# Patient Record
Sex: Male | Born: 1955 | Race: White | Hispanic: No | Marital: Married | Smoking: Never smoker
Health system: Southern US, Community
[De-identification: ages and names within clinical notes are randomized; demographics above are authoritative.]

## PROBLEM LIST (undated history)

## (undated) DIAGNOSIS — M1990 Unspecified osteoarthritis, unspecified site: Secondary | ICD-10-CM

## (undated) DIAGNOSIS — E785 Hyperlipidemia, unspecified: Secondary | ICD-10-CM

## (undated) DIAGNOSIS — R296 Repeated falls: Secondary | ICD-10-CM

## (undated) DIAGNOSIS — K259 Gastric ulcer, unspecified as acute or chronic, without hemorrhage or perforation: Secondary | ICD-10-CM

## (undated) DIAGNOSIS — Z8619 Personal history of other infectious and parasitic diseases: Secondary | ICD-10-CM

## (undated) DIAGNOSIS — J189 Pneumonia, unspecified organism: Secondary | ICD-10-CM

## (undated) DIAGNOSIS — E119 Type 2 diabetes mellitus without complications: Secondary | ICD-10-CM

## (undated) DIAGNOSIS — I10 Essential (primary) hypertension: Secondary | ICD-10-CM

## (undated) DIAGNOSIS — Z87442 Personal history of urinary calculi: Secondary | ICD-10-CM

## (undated) DIAGNOSIS — R202 Paresthesia of skin: Secondary | ICD-10-CM

## (undated) DIAGNOSIS — R2689 Other abnormalities of gait and mobility: Secondary | ICD-10-CM

## (undated) DIAGNOSIS — R200 Anesthesia of skin: Secondary | ICD-10-CM

## (undated) HISTORY — DX: Hyperlipidemia, unspecified: E78.5

## (undated) HISTORY — PX: URETERAL STENT PLACEMENT: SHX822

## (undated) HISTORY — PX: OTHER SURGICAL HISTORY: SHX169

## (undated) HISTORY — DX: Essential (primary) hypertension: I10

## (undated) HISTORY — PX: KNEE ARTHROSCOPY: SHX127

---

## 2005-06-30 ENCOUNTER — Ambulatory Visit (HOSPITAL_COMMUNITY): Admission: RE | Admit: 2005-06-30 | Discharge: 2005-06-30 | Payer: Self-pay | Admitting: Family Medicine

## 2012-01-08 ENCOUNTER — Other Ambulatory Visit: Payer: Self-pay | Admitting: Orthopedic Surgery

## 2012-01-08 DIAGNOSIS — R609 Edema, unspecified: Secondary | ICD-10-CM

## 2012-01-11 ENCOUNTER — Ambulatory Visit
Admission: RE | Admit: 2012-01-11 | Discharge: 2012-01-11 | Disposition: A | Discharge status: Home / Self Care | Payer: 59 | Source: Ambulatory Visit | Attending: Orthopedic Surgery | Admitting: Orthopedic Surgery

## 2012-01-11 DIAGNOSIS — R609 Edema, unspecified: Secondary | ICD-10-CM

## 2014-02-16 ENCOUNTER — Ambulatory Visit (INDEPENDENT_AMBULATORY_CARE_PROVIDER_SITE_OTHER): Payer: 59

## 2014-02-16 VITALS — BP 140/81 | HR 92 | Resp 16 | Ht 71.0 in | Wt 313.0 lb

## 2014-02-16 DIAGNOSIS — M79609 Pain in unspecified limb: Secondary | ICD-10-CM

## 2014-02-16 DIAGNOSIS — M722 Plantar fascial fibromatosis: Secondary | ICD-10-CM

## 2014-02-16 MED ORDER — CELECOXIB 200 MG PO CAPS: 200.0000 mg | ORAL_CAPSULE | Freq: Every day | ORAL | Status: DC

## 2014-02-16 NOTE — Progress Notes (Signed)
   Subjective:    Patient ID: Brent Duarte, male    DOB: 01/27/1956, 58 y.o.   MRN: [id number]  HPI Comments: "I have heel pain"  Patient c/o sharp plantar heel right for a several weeks. He has noticed pain up the lateral side of foot recently. He has AM pain and after sitting for long periods. Walking makes worse. History of achilles tendonitis, went through PT and it finally got better. He has tried new shoes, ice and rest-no reilef.  Foot Pain Associated symptoms include myalgias.   patient also has a history of psoriasis is been treated a dermatology office with a steroid injections periodically. However the arthritis and psoriasis to starting to affect his hands and feet. Patient also has a history of back problems was nursing by the orthopedist or neurosurgeon however still some point will likely need to have surgery I suggest he follow up with a neurosurgeon some point in the future.    Review of Systems  Musculoskeletal: Positive for back pain and myalgias.  All other systems reviewed and are negative.      Objective:   Physical Exam 58 year old white male well-developed well-nourished oriented x3 presents this time with a complaint of the inferior right heel pain and arch pain. Its been going on for a period of months with little or no improvement. Normal humerus work boots steel toe new balance athletic shoes however never had orthotics or arch supports in the past. Recently also for muscle in his right arm trying to pull a lawnmower and stuck in the mud and was prescribed oxycodone, however he is only taking 2 pills because he didn't like the way it made him feel. Vascular status is intact with pedal pulses palpable DP and PT +2/4 bilateral capillary refill time 3 seconds all digits skin temperature warm turgor normal no edema rubor pallor noted no varicosities noted neurologically epicritic and proprioceptive sensations intact and symmetric bilateral is normal plantar response DTRs  not elicited patient describes pain in the heel arch ligaments of the foot mid foot laterally at all and Lisfranc joint and occasionally some burning sensation with prolonged standing or walking. There is no reproducible pain on palpation and tarsal canal no paresthesias or reproducible at this time. Cannot elicit neurologic in nature however more orthopedic biomechanical there is x-rays reveal well-developed retrocalcaneal spur some inferior calcaneal spurring and thickening plantar fascia noted clinically has reduced range of motion of the hallux and lesser MTP digits. This is consistent with arthropathy patient also indicated a history of gout as well as psoriatic arthritis. Pain on palpation of the medial band of the plantar fascia from heel all the way to the metatarsal areas.      Assessment & Plan:  Assessment plantar fasciitis/heel spur syndrome right foot plan at this time patient is placed in fascial strapping the right foot prescription for Celebrex is dispensed recommend ice to the affected area reappointed 2 weeks for followup may be a candidate for orthoses also consider steroid injections if no significant improvement. Patient also advised to obtain some crocs for around the house to avoid going barefoot at any time.  Alvan Dameichard Fransisca Shawn DPM

## 2014-02-16 NOTE — Patient Instructions (Signed)

## 2014-03-02 ENCOUNTER — Ambulatory Visit (INDEPENDENT_AMBULATORY_CARE_PROVIDER_SITE_OTHER): Payer: 59

## 2014-03-02 VITALS — BP 135/60 | HR 76 | Resp 12

## 2014-03-02 DIAGNOSIS — M79609 Pain in unspecified limb: Secondary | ICD-10-CM

## 2014-03-02 DIAGNOSIS — M722 Plantar fascial fibromatosis: Secondary | ICD-10-CM

## 2014-03-02 NOTE — Patient Instructions (Signed)

## 2014-03-02 NOTE — Progress Notes (Signed)
   Subjective:    Patient ID: Brent Duarte, male    DOB: 09/10/1956, 58 y.o.   MRN: [id number]  HPI ''RT FOOT IS A LITTLE BETTER, BUT YESTERDAY IS A BAD DAY.''   Review of Systems no new systemic changes or findings     Objective:   Physical Exam Neurovascular status is intact pedal pulses palpable epicritic and proprioceptive sensations intact patient does have arthropathy had significant improvement of the plantar fasciitis with the strapping dictating place was doing well have flareup yesterday however his back to be better today however still tender on palpation of the mid band of plantar fascia from mid arch medial calcaneal tubercle. No other new changes orthopedic exam reveals inferior calcaneal spurring thickening plantar fascial structures       Assessment & Plan:  Assessment this time plantar fasciitis/heel spur syndrome respond to fascial strapping therefore should respond to functional orthoses orthotics skin carried at this time for new functional orthotic plantar Spenco top cover patient be contacted with in 2-3 weeks for orthotic fitting and dispensing as planned we'll give or written instructions and orthotic use in break in period patient is advised with ice pack to help manage the problem. His patient reduction last 5-10 years or more and should help stabilize the problem prevent recurrent flareups patient is also given the option of a steroid injection if he has a further flareups in the interim.  Alvan Dameichard Kendra Grissett DPM

## 2014-03-26 ENCOUNTER — Ambulatory Visit: Payer: 59 | Admitting: *Deleted

## 2014-03-26 ENCOUNTER — Other Ambulatory Visit: Payer: 59

## 2014-03-26 DIAGNOSIS — M722 Plantar fascial fibromatosis: Secondary | ICD-10-CM

## 2014-03-26 NOTE — Patient Instructions (Signed)

## 2014-03-26 NOTE — Progress Notes (Signed)
   Subjective:    Patient ID: Brent Duarte, male    DOB: 10/30/1955, 58 y.o.   MRN: [id number]  HPI PICK UP ORTHOTICS AND GIVEN INSTRUCTION.   Review of Systems     Objective:   Physical Exam        Assessment & Plan:

## 2014-05-07 ENCOUNTER — Ambulatory Visit: Payer: 59

## 2014-05-11 ENCOUNTER — Ambulatory Visit (INDEPENDENT_AMBULATORY_CARE_PROVIDER_SITE_OTHER): Payer: 59

## 2014-05-11 VITALS — BP 113/76 | HR 91 | Resp 16 | Ht 71.0 in | Wt 282.0 lb

## 2014-05-11 DIAGNOSIS — M722 Plantar fascial fibromatosis: Secondary | ICD-10-CM

## 2014-05-11 DIAGNOSIS — M79606 Pain in leg, unspecified: Secondary | ICD-10-CM

## 2014-05-11 DIAGNOSIS — M79609 Pain in unspecified limb: Secondary | ICD-10-CM

## 2014-05-11 NOTE — Patient Instructions (Signed)

## 2014-05-11 NOTE — Progress Notes (Signed)
   Subjective:    Patient ID: Brent Duarte, male    DOB: 12/15/1955, 58 y.o.   MRN: [id number]  HPI Comments: Pt states he has had a lot improvement with the orthotics.     Review of Systems no new findings or systemic changes noted     Objective:   Physical Exam Patient wearing orthotics for about a month and a half has had significant improvement on the right foot plantar fascia area minimal pain or discomfort on palpation at this time. Did have a flareup when he went for a day without orthotics for Dr. Lily Duarte work boot for 14 hours had a flareup in his result. No new findings no significant changes noted other than improvement patient is given instructions for continued use of orthoses       Assessment & Plan:  Assessment recalcitrant plantar fascitis resolve or improve with biomechanical functional orthotics followup in the next couple years as needed for orthotic adjustments were additional placed orthotics the future if needed maintain orthotics at all times as instructed at  Brent Duarte DPM

## 2015-01-06 ENCOUNTER — Other Ambulatory Visit: Payer: Self-pay | Admitting: Specialist

## 2015-01-06 DIAGNOSIS — M48061 Spinal stenosis, lumbar region without neurogenic claudication: Secondary | ICD-10-CM

## 2015-01-21 ENCOUNTER — Other Ambulatory Visit: Payer: Self-pay | Admitting: Specialist

## 2015-01-21 ENCOUNTER — Other Ambulatory Visit: Payer: 59

## 2015-01-21 DIAGNOSIS — M48061 Spinal stenosis, lumbar region without neurogenic claudication: Secondary | ICD-10-CM

## 2015-01-25 ENCOUNTER — Other Ambulatory Visit: Payer: Self-pay | Admitting: Orthopedic Surgery

## 2015-01-25 DIAGNOSIS — S46211D Strain of muscle, fascia and tendon of other parts of biceps, right arm, subsequent encounter: Secondary | ICD-10-CM

## 2015-02-11 ENCOUNTER — Ambulatory Visit
Admission: RE | Admit: 2015-02-11 | Discharge: 2015-02-11 | Disposition: A | Discharge status: Home / Self Care | Payer: PRIVATE HEALTH INSURANCE | Source: Ambulatory Visit | Attending: Specialist | Admitting: Specialist

## 2015-02-11 ENCOUNTER — Other Ambulatory Visit: Payer: Self-pay | Admitting: Specialist

## 2015-02-11 ENCOUNTER — Inpatient Hospital Stay
Admission: RE | Admit: 2015-02-11 | Discharge: 2015-02-11 | Disposition: A | Discharge status: Home / Self Care | Payer: Self-pay | Source: Ambulatory Visit | Attending: Specialist | Admitting: Specialist

## 2015-02-11 DIAGNOSIS — M48061 Spinal stenosis, lumbar region without neurogenic claudication: Secondary | ICD-10-CM

## 2015-02-11 MED ORDER — ONDANSETRON HCL 4 MG/2ML IJ SOLN
4.0000 mg | Freq: Once | INTRAMUSCULAR | Status: AC
  Administered 2015-02-11: 4 mg via INTRAMUSCULAR

## 2015-02-11 MED ORDER — DIAZEPAM 5 MG PO TABS
10.0000 mg | ORAL_TABLET | Freq: Once | ORAL | Status: AC
  Administered 2015-02-11: 10 mg via ORAL

## 2015-02-11 MED ORDER — MEPERIDINE HCL 100 MG/ML IJ SOLN
100.0000 mg | Freq: Once | INTRAMUSCULAR | Status: AC
  Administered 2015-02-11: 100 mg via INTRAMUSCULAR

## 2015-02-11 MED ORDER — IOHEXOL 180 MG/ML  SOLN
15.0000 mL | Freq: Once | INTRAMUSCULAR | Status: AC | PRN
  Administered 2015-02-11: 15 mL via INTRATHECAL

## 2015-02-11 NOTE — Discharge Instructions (Signed)

## 2015-04-13 ENCOUNTER — Other Ambulatory Visit: Payer: Self-pay | Admitting: Orthopedic Surgery

## 2015-04-13 DIAGNOSIS — S46211D Strain of muscle, fascia and tendon of other parts of biceps, right arm, subsequent encounter: Secondary | ICD-10-CM

## 2015-04-14 ENCOUNTER — Ambulatory Visit
Admission: RE | Admit: 2015-04-14 | Discharge: 2015-04-14 | Disposition: A | Discharge status: Home / Self Care | Payer: PRIVATE HEALTH INSURANCE | Source: Ambulatory Visit | Attending: Orthopedic Surgery | Admitting: Orthopedic Surgery

## 2015-04-14 DIAGNOSIS — S46211D Strain of muscle, fascia and tendon of other parts of biceps, right arm, subsequent encounter: Secondary | ICD-10-CM

## 2015-08-22 ENCOUNTER — Ambulatory Visit: Payer: Self-pay | Admitting: Orthopedic Surgery

## 2015-08-31 ENCOUNTER — Encounter (HOSPITAL_COMMUNITY)
Admission: RE | Admit: 2015-08-31 | Discharge: 2015-08-31 | Disposition: A | Discharge status: Home / Self Care | Payer: 59 | Source: Ambulatory Visit | Attending: Specialist | Admitting: Specialist

## 2015-08-31 ENCOUNTER — Ambulatory Visit (HOSPITAL_COMMUNITY)
Admission: RE | Admit: 2015-08-31 | Discharge: 2015-08-31 | Disposition: A | Discharge status: Home / Self Care | Payer: 59 | Source: Ambulatory Visit | Attending: Orthopedic Surgery | Admitting: Orthopedic Surgery

## 2015-08-31 ENCOUNTER — Encounter (HOSPITAL_COMMUNITY): Payer: Self-pay

## 2015-08-31 DIAGNOSIS — N200 Calculus of kidney: Secondary | ICD-10-CM | POA: Insufficient documentation

## 2015-08-31 DIAGNOSIS — M4806 Spinal stenosis, lumbar region: Secondary | ICD-10-CM | POA: Insufficient documentation

## 2015-08-31 DIAGNOSIS — M5137 Other intervertebral disc degeneration, lumbosacral region: Secondary | ICD-10-CM | POA: Diagnosis not present

## 2015-08-31 DIAGNOSIS — M48061 Spinal stenosis, lumbar region without neurogenic claudication: Secondary | ICD-10-CM

## 2015-08-31 HISTORY — DX: Type 2 diabetes mellitus without complications: E11.9

## 2015-08-31 HISTORY — DX: Pneumonia, unspecified organism: J18.9

## 2015-08-31 HISTORY — DX: Unspecified osteoarthritis, unspecified site: M19.90

## 2015-08-31 HISTORY — DX: Paresthesia of skin: R20.2

## 2015-08-31 HISTORY — DX: Other abnormalities of gait and mobility: R26.89

## 2015-08-31 HISTORY — DX: Personal history of urinary calculi: Z87.442

## 2015-08-31 HISTORY — DX: Repeated falls: R29.6

## 2015-08-31 HISTORY — DX: Paresthesia of skin: R20.0

## 2015-08-31 HISTORY — DX: Gastric ulcer, unspecified as acute or chronic, without hemorrhage or perforation: K25.9

## 2015-08-31 HISTORY — DX: Personal history of other infectious and parasitic diseases: Z86.19

## 2015-08-31 LAB — BASIC METABOLIC PANEL
ANION GAP: 7 (ref 5–15)
BUN: 26 mg/dL — ABNORMAL HIGH (ref 6–20)
CHLORIDE: 105 mmol/L (ref 101–111)
CO2: 26 mmol/L (ref 22–32)
Calcium: 9 mg/dL (ref 8.9–10.3)
Creatinine, Ser: 1.25 mg/dL — ABNORMAL HIGH (ref 0.61–1.24)
GFR calc non Af Amer: >60 mL/min (ref >60)
Glucose, Bld: 120 mg/dL — ABNORMAL HIGH (ref 65–99)
POTASSIUM: 4.3 mmol/L (ref 3.5–5.1)
SODIUM: 138 mmol/L (ref 135–145)

## 2015-08-31 LAB — CBC
HEMATOCRIT: 41.4 % (ref 39.0–52.0)
Hemoglobin: 14.3 g/dL (ref 13.0–17.0)
MCH: 31.3 pg (ref 26.0–34.0)
MCHC: 34.5 g/dL (ref 30.0–36.0)
MCV: 90.6 fL (ref 78.0–100.0)
Platelets: 172 10*3/uL (ref 150–400)
RBC: 4.57 MIL/uL (ref 4.22–5.81)
RDW: 13.5 % (ref 11.5–15.5)
WBC: 6.1 10*3/uL (ref 4.0–10.5)

## 2015-08-31 LAB — SURGICAL PCR SCREEN
MRSA, PCR: NEGATIVE
STAPHYLOCOCCUS AUREUS: NEGATIVE

## 2015-08-31 NOTE — Patient Instructions (Signed)
Brent AlbrightJames P Duarte  08/31/2015   Your procedure is scheduled on: Wednesday September 07, 2015  [facility]  Entrance [place]  [place]vators to 3rd floor to  Short Stay Center at 5:30 AM.  Call this number if you have problems the morning of surgery [phone]   Remember: ONLY 1 PERSON MAY GO WITH YOU TO SHORT STAY TO GET  READY MORNING OF YOUR SURGERY.  Do not eat food or drink liquids :After Midnight.     Take these medicines the morning of surgery: May use albuterol inhaler if needed; (bring with you day of surgery)  DO NOT TAKE ANY DIABETIC MEDICATIONS DAY OF YOUR SURGERY                               You may not have any metal on your body including hair pins and              piercings  Do not wear jewelry,  lotions, powders or colognes, deodorant                           Men may shave face and neck.   Do not bring valuables to the hospital. [facility] IS NOT             RESPONSIBLE   FOR VALUABLES.  Contacts, dentures or bridgework may not be worn into surgery.  Leave suitcase in the car. After surgery it may be brought to your room.                Please read over the following fact sheets you were given:INCENTIVE SPIROMETER; MRSA INFORMATION SHEET _____________________________________________________________________             [facility] Before surgery, you can play an important role.  Because skin is not sterile, your skin needs to be as free of germs as possible.  You can reduce the number of germs on your skin by washing with CHG (chlorahexidine gluconate) soap before surgery.  CHG is an antiseptic cleaner which kills germs and bonds with the skin to continue killing germs even after washing. Please DO NOT use if you have an allergy to CHG or antibacterial soaps.  If your skin becomes reddened/irritated stop using the CHG and inform your nurse when you arrive at Short Stay. Do not shave (including legs and  underarms) for at least 48 hours prior to the first CHG shower.  You may shave your face/neck. Please follow these instructions carefully:  1.  Shower with CHG Soap the night before surgery and the  morning of Surgery.  2.  If you choose to wash your hair, wash your hair first as usual with your  normal  shampoo.  3.  After you shampoo, rinse your hair and body thoroughly to remove the  shampoo.                           4.  Use CHG as you would any other liquid soap.  You can apply chg directly  to the skin and wash                       Gently with a scrungie or clean washcloth.  5.  Apply the CHG Soap to your body ONLY FROM THE NECK DOWN.   Do not use on face/ open                           Wound or open sores. Avoid contact with eyes, ears mouth and genitals (private parts).                       Wash face,  Genitals (private parts) with your normal soap.             6.  Wash thoroughly, paying special attention to the area where your surgery  will be performed.  7.  Thoroughly rinse your body with warm water from the neck down.  8.  DO NOT shower/wash with your normal soap after using and rinsing off  the CHG Soap.                9.  Pat yourself dry with a clean towel.            10.  Wear clean pajamas.            11.  Place clean sheets on your bed the night of your first shower and do not  sleep with pets. Day of Surgery : Do not apply any lotions/deodorants the morning of surgery.  Please wear clean clothes to the hospital/surgery center.  FAILURE TO FOLLOW THESE INSTRUCTIONS MAY RESULT IN THE CANCELLATION OF YOUR SURGERY PATIENT SIGNATURE_________________________________  NURSE SIGNATURE__________________________________  ________________________________________________________________________   Brent Duarte  An incentive spirometer is a tool that can help keep your lungs clear and active. This tool measures how well you are filling your lungs with each breath. Taking  long deep breaths may help reverse or decrease the chance of developing breathing (pulmonary) problems (especially infection) following:  A long period of time when you are unable to move or be active. BEFORE THE PROCEDURE   If the spirometer includes an indicator to show your best effort, your nurse or respiratory therapist will set it to a desired goal.  If possible, sit up straight or lean slightly forward. Try not to slouch.  Hold the incentive spirometer in an upright position. INSTRUCTIONS FOR USE  1. Sit on the edge of your bed if possible, or sit up as far as you can in bed or on a chair. 2. Hold the incentive spirometer in an upright position. 3. Breathe out normally. 4. Place the mouthpiece in your mouth and seal your lips tightly around it. 5. Breathe in slowly and as deeply as possible, raising the piston or the ball toward the top of the column. 6. Hold your breath for 3-5 seconds or for as long as possible. Allow the piston or ball to fall to the bottom of the column. 7. Remove the mouthpiece from your mouth and breathe out normally. 8. Rest for a few seconds and repeat Steps 1 through 7 at least 10 times every 1-2 hours when you are awake. Take your time and take a few normal breaths between deep breaths. 9. The spirometer may include an indicator to show your best effort. Use the indicator as a goal to work toward during each repetition. 10. After each set of 10 deep breaths, practice coughing to be sure your lungs are clear. If you have an incision (the cut made at the time of surgery), support your incision when coughing by placing a  pillow or rolled up towels firmly against it. Once you are able to get out of bed, walk around indoors and cough well. You may stop using the incentive spirometer when instructed by your caregiver.  RISKS AND COMPLICATIONS  Take your time so you do not get dizzy or light-headed.  If you are in pain, you may need to take or ask for pain  medication before doing incentive spirometry. It is harder to take a deep breath if you are having pain. AFTER USE  Rest and breathe slowly and easily.  It can be helpful to keep track of a log of your progress. Your caregiver can provide you with a simple table to help with this. If you are using the spirometer at home, follow these instructions: [place] IF:   You are having difficultly using the spirometer.  You have trouble using the spirometer as often as instructed.  Your pain medication is not giving enough relief while using the spirometer.  You develop fever of 100.5 F (38.1 C) or higher. SEEK IMMEDIATE MEDICAL CARE IF:   You cough up bloody sputum that had not been present before.  You develop fever of 102 F (38.9 C) or greater.  You develop worsening pain at or near the incision site. MAKE SURE YOU:   Understand these instructions.  Will watch your condition.  Will get help right away if you are not doing well or get worse. Document Released: 02/11/2007 Document Revised: 12/24/2011 Document Reviewed: 04/14/2007 [facility] Patient Information 2014 [place], [place].   ________________________________________________________________________

## 2015-08-31 NOTE — Progress Notes (Signed)
EKG per chart 02/10/2015

## 2015-08-31 NOTE — Progress Notes (Signed)
Your patient has screened at an elevated risk for Obstructive Sleep Apnea using the Stop-Bang Tool during a pre-surgical visit. Pt scored at high risk.  

## 2015-08-31 NOTE — Progress Notes (Signed)
BMP results in epic per PAT visit 08/31/2015 sent to Dr Shelle IronBeane

## 2015-09-01 ENCOUNTER — Ambulatory Visit: Payer: Self-pay | Admitting: Orthopedic Surgery

## 2015-09-01 NOTE — H&P (Signed)
Brent Duarte is an 59 y.o. male.   Chief Complaint: back and left leg pain HPI: The patient is a 59 year old male who presents today for follow up of their back. The patient is being followed for their back pain. They are now year(s) out from when symptoms began. Symptoms reported today include: pain and numbness. Current treatment includes: home exercise program, activity modification, NSAIDs and pain medications. The following medication has been used for pain control: antiinflammatory medication (Advil) and Extra Strength Tylenol or Percocet (prn).  Subjective Transcription(Jeffrey Windy Kalata, MD; 08/17/2015 2:47 PM) Brent Duarte follows up. His legs, especially his left is giving way and is weak on him, it radiates into his left leg. Past Medical History  Diagnosis Date  . Hypertension   . Hyperlipidemia   . Numbness and tingling of both legs   . Imbalance     secondary to back issues  . Recurrent falls   . Pneumonia   . Diabetes mellitus without complication ([place])   . History of kidney stones   . Multiple gastric ulcers   . Arthritis   . History of chicken pox   . History of measles   . History of mumps     Past Surgical History  Procedure Laterality Date  . Knee arthroscopy      bilat  . Bicep tear      right lower arm related to overgrowth of bone as stated per pt   . Lithrotripsy      for kidney stones  . Ureteral stent placement      secondary to kidney stones/pt states had laser surgery prior     No family history on file. Social History:  reports that he has never smoked. He has never used smokeless tobacco. He reports that he does not drink alcohol or use illicit drugs.  Allergies:  Allergies  Allergen Reactions  . Morphine And Related Other (See Comments)    Pt states does not work at relieving pain - states can not tell it works at all      (Not in a hospital admission)  Results for orders placed or performed during the hospital encounter of 08/31/15 (from  the past 48 hour(s))  Basic metabolic panel     Status: Abnormal   Collection Time: 08/31/15  8:50 AM  Result Value Ref Range   Sodium 138 135 - 145 mmol/L   Potassium 4.3 3.5 - 5.1 mmol/L   Chloride 105 101 - 111 mmol/L   CO2 26 22 - 32 mmol/L   Glucose, Bld 120 (H) 65 - 99 mg/dL   BUN 26 (H) 6 - 20 mg/dL   Creatinine, Ser 1.25 (H) 0.61 - 1.24 mg/dL   Calcium 9.0 8.9 - 10.3 mg/dL   GFR calc non Af Amer >60 >60 mL/min   GFR calc Af Amer >60 >60 mL/min    Comment: (NOTE) The eGFR has been calculated using the CKD EPI equation. This calculation has not been validated in all clinical situations. eGFR's persistently <60 mL/min signify possible Chronic Kidney Disease.    Anion gap 7 5 - 15  CBC     Status: None   Collection Time: 08/31/15  8:50 AM  Result Value Ref Range   WBC 6.1 4.0 - 10.5 K/uL   RBC 4.57 4.22 - 5.81 MIL/uL   Hemoglobin 14.3 13.0 - 17.0 g/dL   HCT 41.4 39.0 - 52.0 %   MCV 90.6 78.0 - 100.0 fL   MCH  31.3 26.0 - 34.0 pg   MCHC 34.5 30.0 - 36.0 g/dL   RDW 13.5 11.5 - 15.5 %   Platelets 172 150 - 400 K/uL  Surgical pcr screen     Status: None   Collection Time: 08/31/15  8:50 AM  Result Value Ref Range   MRSA, PCR NEGATIVE NEGATIVE   Staphylococcus aureus NEGATIVE NEGATIVE    Comment:        The Xpert SA Assay (FDA approved for NASAL specimens in patients over 36 years of age), is one component of a comprehensive surveillance program.  Test performance has been validated by [facility] for patients greater than or equal to 39 year old. It is not intended to diagnose infection nor to guide or monitor treatment.    Dg Lumbar Spine 2-3 Views  08/31/2015  CLINICAL DATA:  Right low back pain radiating down the leg and to the Celsius. EXAM: LUMBAR SPINE - 2-3 VIEW COMPARISON:  None. FINDINGS: There is no evidence of lumbar spine fracture. There is minimal grade 1 anterolisthesis of L5 on S1 secondary to bilateral L5 pars interarticularis defects. There is  mild degenerative disc disease with disc height loss at L5-S1. Mild bilateral facet arthropathy at L4-5. There are mild degenerative changes of bilateral sacroiliac joints. There is right nephrolithiasis. There is a 5 mm calcification projecting over the right psoas muscle which may reflect dystrophic calcification versus ureterolithiasis. IMPRESSION: 1. There is minimal grade 1 anterolisthesis of L5 on S1 secondary to bilateral L5 pars interarticularis defects. 2. There is mild degenerative disc disease with disc height loss at L5-S1. 3. There is right nephrolithiasis. There is a 5 mm calcification projecting over the right psoas muscle which may reflect dystrophic calcification versus ureterolithiasis. Electronically Signed   By: Kathreen Devoid   On: 08/31/2015 09:29    Review of Systems  Constitutional: Negative.   HENT: Negative.   Eyes: Negative.   Respiratory: Negative.   Cardiovascular: Negative.   Gastrointestinal: Negative.   Genitourinary: Negative.   Musculoskeletal: Positive for back pain.  Skin: Negative.   Neurological: Positive for sensory change and focal weakness.  Psychiatric/Behavioral: Negative.     There were no vitals taken for this visit. Physical Exam  Constitutional: He is oriented to person, place, and time. He appears well-developed and well-nourished.  HENT:  Head: Normocephalic and atraumatic.  Eyes: Pupils are equal, round, and reactive to light.  Neck: Normal range of motion.  Cardiovascular: Normal rate.   Respiratory: Effort normal.  GI: Soft.  Musculoskeletal:  Straight leg raise buttock, thigh and calf pain on the left, buttock pain on the right. EHL is 5-/5 on the left compared to the right. Pain with extension and flexion of the lumbar spine.  Lumbar spine exam reveals no evidence of soft tissue swelling, deformity or skin ecchymosis. On palpation there is no tenderness of the lumbar spine. No flank pain with percussion. The abdomen is soft and  nontender. Nontender over the trochanters. No cellulitis or lymphadenopathy.  Motor is 5/5 including tibialis anterior, plantar flexion, quadriceps and hamstrings. Patient is normoreflexic. There is no Babinski or clonus. Sensory exam is intact to light touch. Patient has good distal pulses. No DVT. No pain and normal range of motion without instability of the hips, knees and ankles.  Neurological: He is alert and oriented to person, place, and time.  Skin: Skin is warm and dry.  Psychiatric: He has a normal mood and affect.     He does have  stenosis on his myelogram that was significant in the upright position. He has some degenerative changes in the sacroiliac joint as well bilaterally has disc degeneration at 5-1. He has listhesis at 5-1 without instability in flexion and extension. No myelogram defect at that level. At 4-5, he has facet arthropathy, ligamentum flavum hypertrophy and deviation of the descending 5 root. There is mass effect in the left subarticular zone displacing the 5 root in the lateral recess on his myelogram. Disc herniation at 4-5 as well. We talked about synovial cyst arising from the left facet joint. Nerve sheath tumor considered unlikely. Right subarticular zone appears to be intact.  There is some degenerative changes at 3-4 as well.  Assessment/Plan Lumbar spinal stenosis L4-5 1. Persisting lower extremity radicular pain, particularly left given facet hypertrophy and possible synovial cyst. 2. Degenerative isthmic spondylolisthesis at 5-1. No evidence of any flexion and extension. Myelogram demonstrated no neural compressive lesion. 3. Possible synovial cyst at 4-5.   We had extensive discussion concerning current pathology, relevant anatomy and treatment options. I feel since he has had temporary relief from an epidural, we will consider to proceed with decompression at 4-5.  I had an extensive discussion of the risks and benefits of the lumbar decompression with  the patient including bleeding, infection, damage to neurovascular structures, epidural fibrosis, CSF leak requiring repair. We also discussed increase in pain, adjacent segment disease, recurrent disc herniation, need for future surgery including repeat decompression and/or fusion. We also discussed risks of postoperative hematoma, paralysis, anesthetic complications including DVT, PE, death, cardiopulmonary dysfunction. In addition, the perioperative and postoperative courses were discussed in detail including the rehabilitative time and return to functional activity and work. I provided the patient with an illustrated handout and utilized the appropriate surgical models.  He does have straight leg raise today and EHL weakness as well. Possible 3-4 decompression. We discussed residual back pain potentially requiring fusion in the future. We discussed this particularly at L5-S1 and he understands that as well as SI joint arthrosis which could be giving him symptomatology as well. Overnight in the hospital, preoperative clearance, history of staph, not MRSA, able to take Kefzol. Proceed accordingly. He does have a history of gout as well. He occasionally takes colchicine as well as a history of psoriasis.  Plan microlumbar decompression L4-5, possible L3-4  Nidal Rivet M. PA-C for Dr. Tonita Cong 09/01/2015, 2:14 PM

## 2015-09-06 MED ORDER — DEXTROSE 5 % IV SOLN
3.0000 g | INTRAVENOUS | Status: AC
  Administered 2015-09-07: 3 g via INTRAVENOUS
  Filled 2015-09-06 (×2): qty 3000

## 2015-09-06 NOTE — Anesthesia Preprocedure Evaluation (Addendum)
Anesthesia Evaluation  Patient identified by MRN, date of birth, ID band Patient awake    Reviewed: Allergy & Precautions, H&P , NPO status , Patient's Chart, lab work & pertinent test results  Airway Mallampati: III  TM Distance: >3 FB Neck ROM: full    Dental no notable dental hx. (+) Dental Advisory Given, Teeth Intact   Pulmonary neg pulmonary ROS,    Pulmonary exam normal breath sounds clear to auscultation       Cardiovascular Exercise Tolerance: Good hypertension, negative cardio ROS Normal cardiovascular exam Rhythm:regular Rate:Normal     Neuro/Psych negative neurological ROS  negative psych ROS   GI/Hepatic negative GI ROS, Neg liver ROS, PUD,   Endo/Other  negative endocrine ROSdiabetes, Well Controlled, Type 2, Oral Hypoglycemic AgentsMorbid obesity  Renal/GU negative Renal ROS  negative genitourinary   Musculoskeletal   Abdominal (+) + obese,   Peds  Hematology negative hematology ROS (+)   Anesthesia Other Findings   Reproductive/Obstetrics negative OB ROS                           Anesthesia Physical Anesthesia Plan  ASA: III  Anesthesia Plan: General   Post-op Pain Management:    Induction: Intravenous  Airway Management Planned: Oral ETT  Additional Equipment:   Intra-op Plan:   Post-operative Plan: Extubation in OR  Informed Consent: I have reviewed the patients History and Physical, chart, labs and discussed the procedure including the risks, benefits and alternatives for the proposed anesthesia with the patient or authorized representative who has indicated his/her understanding and acceptance.   Dental Advisory Given  Plan Discussed with: CRNA and Surgeon  Anesthesia Plan Comments:         Anesthesia Quick Evaluation

## 2015-09-07 ENCOUNTER — Ambulatory Visit (HOSPITAL_COMMUNITY): Payer: 59

## 2015-09-07 ENCOUNTER — Encounter (HOSPITAL_COMMUNITY): Payer: Self-pay | Admitting: *Deleted

## 2015-09-07 ENCOUNTER — Encounter (HOSPITAL_COMMUNITY)
Admission: RE | Disposition: A | Discharge status: Home / Self Care | Payer: Self-pay | Source: Ambulatory Visit | Attending: Specialist

## 2015-09-07 ENCOUNTER — Ambulatory Visit (HOSPITAL_COMMUNITY): Payer: 59 | Admitting: Certified Registered"

## 2015-09-07 ENCOUNTER — Ambulatory Visit (HOSPITAL_COMMUNITY)
Admission: RE | Admit: 2015-09-07 | Discharge: 2015-09-08 | Disposition: A | Discharge status: Home / Self Care | Payer: 59 | Source: Ambulatory Visit | Attending: Specialist | Admitting: Specialist

## 2015-09-07 DIAGNOSIS — I10 Essential (primary) hypertension: Secondary | ICD-10-CM | POA: Diagnosis not present

## 2015-09-07 DIAGNOSIS — Z7984 Long term (current) use of oral hypoglycemic drugs: Secondary | ICD-10-CM | POA: Insufficient documentation

## 2015-09-07 DIAGNOSIS — M5116 Intervertebral disc disorders with radiculopathy, lumbar region: Secondary | ICD-10-CM | POA: Insufficient documentation

## 2015-09-07 DIAGNOSIS — E119 Type 2 diabetes mellitus without complications: Secondary | ICD-10-CM | POA: Insufficient documentation

## 2015-09-07 DIAGNOSIS — M7138 Other bursal cyst, other site: Secondary | ICD-10-CM | POA: Diagnosis not present

## 2015-09-07 DIAGNOSIS — M4316 Spondylolisthesis, lumbar region: Secondary | ICD-10-CM | POA: Diagnosis not present

## 2015-09-07 DIAGNOSIS — E785 Hyperlipidemia, unspecified: Secondary | ICD-10-CM | POA: Diagnosis not present

## 2015-09-07 DIAGNOSIS — M4806 Spinal stenosis, lumbar region: Secondary | ICD-10-CM | POA: Insufficient documentation

## 2015-09-07 DIAGNOSIS — Z419 Encounter for procedure for purposes other than remedying health state, unspecified: Secondary | ICD-10-CM

## 2015-09-07 DIAGNOSIS — M48061 Spinal stenosis, lumbar region without neurogenic claudication: Secondary | ICD-10-CM

## 2015-09-07 DIAGNOSIS — Z6839 Body mass index (BMI) 39.0-39.9, adult: Secondary | ICD-10-CM | POA: Diagnosis not present

## 2015-09-07 HISTORY — PX: LUMBAR LAMINECTOMY/DECOMPRESSION MICRODISCECTOMY: SHX5026

## 2015-09-07 LAB — GLUCOSE, CAPILLARY
GLUCOSE-CAPILLARY: 121 mg/dL — AB (ref 65–99)
GLUCOSE-CAPILLARY: 159 mg/dL — AB (ref 65–99)
GLUCOSE-CAPILLARY: 178 mg/dL — AB (ref 65–99)
GLUCOSE-CAPILLARY: 180 mg/dL — AB (ref 65–99)
Glucose-Capillary: 169 mg/dL — ABNORMAL HIGH (ref 65–99)

## 2015-09-07 SURGERY — LUMBAR LAMINECTOMY/DECOMPRESSION MICRODISCECTOMY 2 LEVELS
Anesthesia: General | Site: Back

## 2015-09-07 MED ORDER — PHENYLEPHRINE 40 MCG/ML (10ML) SYRINGE FOR IV PUSH (FOR BLOOD PRESSURE SUPPORT)
PREFILLED_SYRINGE | INTRAVENOUS | Status: AC
  Filled 2015-09-07: qty 10

## 2015-09-07 MED ORDER — INSULIN ASPART 100 UNIT/ML ~~LOC~~ SOLN
0.0000 [IU] | Freq: Three times a day (TID) | SUBCUTANEOUS | Status: DC
  Administered 2015-09-07: 4 [IU] via SUBCUTANEOUS
  Administered 2015-09-08: 3 [IU] via SUBCUTANEOUS

## 2015-09-07 MED ORDER — BISACODYL 5 MG PO TBEC: 5.0000 mg | DELAYED_RELEASE_TABLET | Freq: Every day | ORAL | Status: DC | PRN

## 2015-09-07 MED ORDER — METHOCARBAMOL 500 MG PO TABS
500.0000 mg | ORAL_TABLET | Freq: Four times a day (QID) | ORAL | Status: DC | PRN
  Administered 2015-09-08 (×2): 500 mg via ORAL
  Filled 2015-09-07 (×2): qty 1

## 2015-09-07 MED ORDER — SUGAMMADEX SODIUM 500 MG/5ML IV SOLN
INTRAVENOUS | Status: AC
  Filled 2015-09-07: qty 5

## 2015-09-07 MED ORDER — HYDROMORPHONE HCL 1 MG/ML IJ SOLN
INTRAMUSCULAR | Status: DC | PRN
  Administered 2015-09-07: 1 mg via INTRAVENOUS
  Administered 2015-09-07 (×2): 0.5 mg via INTRAVENOUS

## 2015-09-07 MED ORDER — BUPIVACAINE-EPINEPHRINE (PF) 0.5% -1:200000 IJ SOLN
INTRAMUSCULAR | Status: DC | PRN
  Administered 2015-09-07: 15 mL

## 2015-09-07 MED ORDER — METHOCARBAMOL 1000 MG/10ML IJ SOLN
500.0000 mg | Freq: Four times a day (QID) | INTRAVENOUS | Status: DC | PRN
  Filled 2015-09-07: qty 5

## 2015-09-07 MED ORDER — HYDROMORPHONE HCL 2 MG/ML IJ SOLN
INTRAMUSCULAR | Status: AC
  Filled 2015-09-07: qty 1

## 2015-09-07 MED ORDER — ONDANSETRON HCL 4 MG/2ML IJ SOLN
INTRAMUSCULAR | Status: AC
  Filled 2015-09-07: qty 2

## 2015-09-07 MED ORDER — SUGAMMADEX SODIUM 500 MG/5ML IV SOLN
INTRAVENOUS | Status: DC | PRN
  Administered 2015-09-07: 300 mg via INTRAVENOUS

## 2015-09-07 MED ORDER — ROCURONIUM BROMIDE 100 MG/10ML IV SOLN
INTRAVENOUS | Status: DC | PRN
  Administered 2015-09-07 (×5): 10 mg via INTRAVENOUS
  Administered 2015-09-07: 35 mg via INTRAVENOUS
  Administered 2015-09-07: 5 mg via INTRAVENOUS
  Administered 2015-09-07 (×2): 10 mg via INTRAVENOUS

## 2015-09-07 MED ORDER — LIDOCAINE HCL (CARDIAC) 20 MG/ML IV SOLN
INTRAVENOUS | Status: AC
  Filled 2015-09-07: qty 5

## 2015-09-07 MED ORDER — SODIUM CHLORIDE 0.9 % IR SOLN
Status: AC
  Filled 2015-09-07: qty 1

## 2015-09-07 MED ORDER — OXYCODONE-ACETAMINOPHEN 5-325 MG PO TABS: 1.0000 | ORAL_TABLET | ORAL | Status: DC | PRN

## 2015-09-07 MED ORDER — MIDAZOLAM HCL 5 MG/5ML IJ SOLN
INTRAMUSCULAR | Status: DC | PRN
  Administered 2015-09-07: 2 mg via INTRAVENOUS

## 2015-09-07 MED ORDER — ONDANSETRON HCL 4 MG/2ML IJ SOLN: 4.0000 mg | INTRAMUSCULAR | Status: DC | PRN

## 2015-09-07 MED ORDER — ALBUTEROL SULFATE (2.5 MG/3ML) 0.083% IN NEBU: 2.5000 mg | INHALATION_SOLUTION | Freq: Four times a day (QID) | RESPIRATORY_TRACT | Status: DC | PRN

## 2015-09-07 MED ORDER — RISAQUAD PO CAPS
1.0000 | ORAL_CAPSULE | Freq: Every day | ORAL | Status: DC
  Administered 2015-09-07 – 2015-09-08 (×2): 1 via ORAL
  Filled 2015-09-07 (×2): qty 1

## 2015-09-07 MED ORDER — ONDANSETRON HCL 4 MG/2ML IJ SOLN
INTRAMUSCULAR | Status: DC | PRN
  Administered 2015-09-07: 4 mg via INTRAVENOUS

## 2015-09-07 MED ORDER — TESTOSTERONE CYPIONATE 200 MG/ML IM SOLN: 200.0000 mg | INTRAMUSCULAR | Status: DC

## 2015-09-07 MED ORDER — DEXAMETHASONE SODIUM PHOSPHATE 10 MG/ML IJ SOLN
INTRAMUSCULAR | Status: DC | PRN
  Administered 2015-09-07: 10 mg via INTRAVENOUS

## 2015-09-07 MED ORDER — LACTATED RINGERS IV SOLN
INTRAVENOUS | Status: DC
  Administered 2015-09-07: 07:00:00 via INTRAVENOUS
  Administered 2015-09-07: 1000 mL via INTRAVENOUS
  Administered 2015-09-07: 09:00:00 via INTRAVENOUS

## 2015-09-07 MED ORDER — BUPIVACAINE-EPINEPHRINE (PF) 0.5% -1:200000 IJ SOLN
INTRAMUSCULAR | Status: AC
  Filled 2015-09-07: qty 30

## 2015-09-07 MED ORDER — PHENOL 1.4 % MT LIQD
1.0000 | OROMUCOSAL | Status: DC | PRN
  Filled 2015-09-07: qty 177

## 2015-09-07 MED ORDER — MAGNESIUM CITRATE PO SOLN: 1.0000 | Freq: Once | ORAL | Status: DC | PRN

## 2015-09-07 MED ORDER — FENTANYL CITRATE (PF) 250 MCG/5ML IJ SOLN
INTRAMUSCULAR | Status: AC
  Filled 2015-09-07: qty 5

## 2015-09-07 MED ORDER — THROMBIN 5000 UNITS EX SOLR
CUTANEOUS | Status: DC | PRN
  Administered 2015-09-07: 10000 [IU] via TOPICAL

## 2015-09-07 MED ORDER — SENNOSIDES-DOCUSATE SODIUM 8.6-50 MG PO TABS: 1.0000 | ORAL_TABLET | Freq: Every evening | ORAL | Status: DC | PRN

## 2015-09-07 MED ORDER — OXYCODONE-ACETAMINOPHEN 10-325 MG PO TABS: 1.0000 | ORAL_TABLET | ORAL | Status: AC | PRN

## 2015-09-07 MED ORDER — LIDOCAINE HCL (CARDIAC) 20 MG/ML IV SOLN
INTRAVENOUS | Status: DC | PRN
  Administered 2015-09-07: 50 mg via INTRAVENOUS

## 2015-09-07 MED ORDER — HYDROMORPHONE HCL 1 MG/ML IJ SOLN
INTRAMUSCULAR | Status: AC
  Filled 2015-09-07: qty 1

## 2015-09-07 MED ORDER — METHOCARBAMOL 500 MG PO TABS: 500.0000 mg | ORAL_TABLET | Freq: Three times a day (TID) | ORAL | Status: DC | PRN

## 2015-09-07 MED ORDER — SUCCINYLCHOLINE CHLORIDE 20 MG/ML IJ SOLN
INTRAMUSCULAR | Status: DC | PRN
  Administered 2015-09-07: 100 mg via INTRAVENOUS

## 2015-09-07 MED ORDER — PROPOFOL 10 MG/ML IV BOLUS
INTRAVENOUS | Status: DC | PRN
  Administered 2015-09-07: 200 mg via INTRAVENOUS

## 2015-09-07 MED ORDER — DEXAMETHASONE SODIUM PHOSPHATE 10 MG/ML IJ SOLN
INTRAMUSCULAR | Status: AC
  Filled 2015-09-07: qty 1

## 2015-09-07 MED ORDER — THROMBIN 5000 UNITS EX SOLR
CUTANEOUS | Status: AC
  Filled 2015-09-07: qty 10000

## 2015-09-07 MED ORDER — ROCURONIUM BROMIDE 100 MG/10ML IV SOLN
INTRAVENOUS | Status: AC
  Filled 2015-09-07: qty 1

## 2015-09-07 MED ORDER — MIDAZOLAM HCL 2 MG/2ML IJ SOLN
INTRAMUSCULAR | Status: AC
  Filled 2015-09-07: qty 2

## 2015-09-07 MED ORDER — KCL IN DEXTROSE-NACL 20-5-0.45 MEQ/L-%-% IV SOLN
INTRAVENOUS | Status: AC
  Administered 2015-09-07: 15:00:00 via INTRAVENOUS
  Filled 2015-09-07 (×2): qty 1000

## 2015-09-07 MED ORDER — HYDROMORPHONE HCL 1 MG/ML IJ SOLN
0.2500 mg | INTRAMUSCULAR | Status: DC | PRN
  Administered 2015-09-07 (×2): 0.5 mg via INTRAVENOUS

## 2015-09-07 MED ORDER — SODIUM CHLORIDE 0.9 % IR SOLN
Status: DC | PRN
  Administered 2015-09-07: 500 mL

## 2015-09-07 MED ORDER — GUAIFENESIN ER 600 MG PO TB12
1200.0000 mg | ORAL_TABLET | Freq: Two times a day (BID) | ORAL | Status: DC | PRN
  Administered 2015-09-07: 1200 mg via ORAL
  Filled 2015-09-07: qty 2

## 2015-09-07 MED ORDER — HYDROMORPHONE HCL 1 MG/ML IJ SOLN
0.5000 mg | INTRAMUSCULAR | Status: DC | PRN
Start: 2015-09-07 — End: 2015-09-08
  Administered 2015-09-08: 0.5 mg via INTRAVENOUS
  Filled 2015-09-07: qty 1

## 2015-09-07 MED ORDER — DOCUSATE SODIUM 100 MG PO CAPS
100.0000 mg | ORAL_CAPSULE | Freq: Two times a day (BID) | ORAL | Status: DC
  Administered 2015-09-07 – 2015-09-08 (×3): 100 mg via ORAL

## 2015-09-07 MED ORDER — ALUM & MAG HYDROXIDE-SIMETH 200-200-20 MG/5ML PO SUSP: 30.0000 mL | Freq: Four times a day (QID) | ORAL | Status: DC | PRN

## 2015-09-07 MED ORDER — ACETAMINOPHEN 325 MG PO TABS: 650.0000 mg | ORAL_TABLET | ORAL | Status: DC | PRN

## 2015-09-07 MED ORDER — HYDROCODONE-ACETAMINOPHEN 5-325 MG PO TABS
1.0000 | ORAL_TABLET | ORAL | Status: DC | PRN
  Administered 2015-09-07: 1 via ORAL
  Administered 2015-09-07: 2 via ORAL
  Administered 2015-09-07: 1 via ORAL
  Administered 2015-09-08 (×3): 2 via ORAL
  Filled 2015-09-07 (×2): qty 1
  Filled 2015-09-07 (×4): qty 2

## 2015-09-07 MED ORDER — FENTANYL CITRATE (PF) 100 MCG/2ML IJ SOLN
INTRAMUSCULAR | Status: DC | PRN
  Administered 2015-09-07: 100 ug via INTRAVENOUS
  Administered 2015-09-07 (×3): 50 ug via INTRAVENOUS

## 2015-09-07 MED ORDER — CEFAZOLIN SODIUM-DEXTROSE 2-3 GM-% IV SOLR
2.0000 g | Freq: Three times a day (TID) | INTRAVENOUS | Status: AC
  Administered 2015-09-07 – 2015-09-08 (×3): 2 g via INTRAVENOUS
  Filled 2015-09-07 (×5): qty 50

## 2015-09-07 MED ORDER — ALLOPURINOL 300 MG PO TABS
300.0000 mg | ORAL_TABLET | Freq: Every day | ORAL | Status: DC
  Administered 2015-09-07: 300 mg via ORAL
  Filled 2015-09-07 (×2): qty 1

## 2015-09-07 MED ORDER — LACTATED RINGERS IV SOLN: INTRAVENOUS | Status: DC

## 2015-09-07 MED ORDER — ACETAMINOPHEN 650 MG RE SUPP: 650.0000 mg | RECTAL | Status: DC | PRN

## 2015-09-07 MED ORDER — PHENYLEPHRINE HCL 10 MG/ML IJ SOLN
INTRAMUSCULAR | Status: DC | PRN
  Administered 2015-09-07 (×2): 40 ug via INTRAVENOUS

## 2015-09-07 MED ORDER — MENTHOL 3 MG MT LOZG: 1.0000 | LOZENGE | OROMUCOSAL | Status: DC | PRN

## 2015-09-07 MED ORDER — DOCUSATE SODIUM 100 MG PO CAPS: 100.0000 mg | ORAL_CAPSULE | Freq: Two times a day (BID) | ORAL | Status: DC | PRN

## 2015-09-07 MED ORDER — PROPOFOL 10 MG/ML IV BOLUS
INTRAVENOUS | Status: AC
  Filled 2015-09-07: qty 20

## 2015-09-07 MED ORDER — CLOBETASOL PROPIONATE 0.05 % EX OINT
1.0000 "application " | TOPICAL_OINTMENT | Freq: Three times a day (TID) | CUTANEOUS | Status: DC
  Administered 2015-09-08: 1 via TOPICAL
  Filled 2015-09-07: qty 15

## 2015-09-07 SURGICAL SUPPLY — 56 items
BAG SPEC THK2 15X12 ZIP CLS (MISCELLANEOUS) ×1
BAG ZIPLOCK 12X15 (MISCELLANEOUS) ×2 IMPLANT
CLEANER TIP ELECTROSURG 2X2 (MISCELLANEOUS) ×3 IMPLANT
CLOSURE WOUND 1/2 X4 (GAUZE/BANDAGES/DRESSINGS)
CLOTH 2% CHLOROHEXIDINE 3PK (PERSONAL CARE ITEMS) ×3 IMPLANT
DRAPE MICROSCOPE LEICA (MISCELLANEOUS) ×3 IMPLANT
DRAPE SHEET LG 3/4 BI-LAMINATE (DRAPES) IMPLANT
DRAPE SURG 17X11 SM STRL (DRAPES) ×3 IMPLANT
DRAPE UTILITY XL STRL (DRAPES) ×3 IMPLANT
DRSG AQUACEL AG ADV 3.5X 4 (GAUZE/BANDAGES/DRESSINGS) IMPLANT
DRSG AQUACEL AG ADV 3.5X 6 (GAUZE/BANDAGES/DRESSINGS) IMPLANT
DRSG EMULSION OIL 3X3 NADH (GAUZE/BANDAGES/DRESSINGS) ×2 IMPLANT
DURAFORM SPONGE 2X2 SINGLE (Neuro Prosthesis/Implant) ×2 IMPLANT
DURAPREP 26ML APPLICATOR (WOUND CARE) ×3 IMPLANT
DURASEAL SPINE SEALANT 3ML (MISCELLANEOUS) IMPLANT
ELECT BLADE TIP CTD 4 INCH (ELECTRODE) ×2 IMPLANT
ELECT REM PT RETURN 9FT ADLT (ELECTROSURGICAL) ×3
ELECTRODE REM PT RTRN 9FT ADLT (ELECTROSURGICAL) ×1 IMPLANT
GAUZE SPONGE 4X4 12PLY STRL (GAUZE/BANDAGES/DRESSINGS) ×2 IMPLANT
GLOVE BIOGEL PI IND STRL 7.0 (GLOVE) ×1 IMPLANT
GLOVE BIOGEL PI INDICATOR 7.0 (GLOVE) ×2
GLOVE SURG SS PI 7.0 STRL IVOR (GLOVE) ×3 IMPLANT
GLOVE SURG SS PI 7.5 STRL IVOR (GLOVE) ×3 IMPLANT
GLOVE SURG SS PI 8.0 STRL IVOR (GLOVE) ×6 IMPLANT
GOWN STRL REUS W/TWL XL LVL3 (GOWN DISPOSABLE) ×6 IMPLANT
IV CATH 14GX2 1/4 (CATHETERS) ×2 IMPLANT
KIT BASIN OR (CUSTOM PROCEDURE TRAY) ×3 IMPLANT
KIT POSITIONING SURG ANDREWS (MISCELLANEOUS) ×3 IMPLANT
MANIFOLD NEPTUNE II (INSTRUMENTS) ×3 IMPLANT
NDL SPNL 18GX3.5 QUINCKE PK (NEEDLE) ×2 IMPLANT
NEEDLE SPNL 18GX3.5 QUINCKE PK (NEEDLE) ×6 IMPLANT
PACK LAMINECTOMY ORTHO (CUSTOM PROCEDURE TRAY) ×3 IMPLANT
PAD ABD 8X10 STRL (GAUZE/BANDAGES/DRESSINGS) ×2 IMPLANT
PATTIES SURGICAL .5 X.5 (GAUZE/BANDAGES/DRESSINGS) ×9 IMPLANT
PATTIES SURGICAL .75X.75 (GAUZE/BANDAGES/DRESSINGS) ×6 IMPLANT
PATTIES SURGICAL 1X1 (DISPOSABLE) IMPLANT
PEN SKIN MARKING BROAD (MISCELLANEOUS) ×3 IMPLANT
RUBBERBAND STERILE (MISCELLANEOUS) ×3 IMPLANT
SPONGE LAP 4X18 X RAY DECT (DISPOSABLE) IMPLANT
SPONGE SURGIFOAM ABS GEL 100 (HEMOSTASIS) ×3 IMPLANT
STAPLER VISISTAT (STAPLE) IMPLANT
STRIP CLOSURE SKIN 1/2X4 (GAUZE/BANDAGES/DRESSINGS) IMPLANT
SUT NURALON 4 0 TR CR/8 (SUTURE) IMPLANT
SUT PROLENE 3 0 PS 2 (SUTURE) IMPLANT
SUT VIC AB 1 CT1 27 (SUTURE) ×15
SUT VIC AB 1 CT1 27XBRD ANTBC (SUTURE) IMPLANT
SUT VIC AB 1-0 CT2 27 (SUTURE) IMPLANT
SUT VIC AB 2-0 CT1 27 (SUTURE) ×3
SUT VIC AB 2-0 CT1 TAPERPNT 27 (SUTURE) IMPLANT
SUT VIC AB 2-0 CT2 27 (SUTURE) ×2 IMPLANT
SUT VLOC 180 0 24IN GS25 (SUTURE) ×2 IMPLANT
SYR 3ML LL SCALE MARK (SYRINGE) IMPLANT
TAPE CLOTH SURG 6X10 WHT LF (GAUZE/BANDAGES/DRESSINGS) ×2 IMPLANT
TOWEL OR 17X26 10 PK STRL BLUE (TOWEL DISPOSABLE) ×3 IMPLANT
TOWEL OR NON WOVEN STRL DISP B (DISPOSABLE) ×3 IMPLANT
YANKAUER SUCT BULB TIP NO VENT (SUCTIONS) ×3 IMPLANT

## 2015-09-07 NOTE — Op Note (Signed)
NAME:  Brent Duarte, [name]:  [id number]  MEDICAL RECORD NO.:  [id number]  LOCATION:  [facility]                         FACILITY:  [facility]  PHYSICIAN:  Jene Every, M.D.    DATE OF BIRTH:  26-Jul-1956  DATE OF PROCEDURE:  09/07/2015 DATE OF DISCHARGE:                              OPERATIVE REPORT   PREOPERATIVE DIAGNOSES:  Spinal stenosis, L4-5, multifactorial. Elevated BMI of 39.  POSTOPERATIVE DIAGNOSES:  Spinal stenosis, L4-5, multifactorial. Elevated BMI of 39.  PROCEDURES PERFORMED: 1. Microlumbar decompression at L4-5 left with foraminotomies L4 and     L5. 2. Excision of synovial cyst at L4-5 facet.  SURGEON:  Jene Every, M.D.  ASSISTANT:  Lanna Poche, PA and Georges Lynch. Darrelyn Hillock, M.D.  Technical difficulty increased due to the patient's elevated BMI.  HISTORY:  This is a 59 year old with lower extremity radicular pain predominantly on the left at L5 and occasionally onto the right.  He had a significant lateral recess stenosis noted at L4-5 secondary to facet hypertrophy, ligamentum flavum hypertrophy, and synovial cyst.  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  He had elevated BMI. He had some degeneration at stenosis at 3-4.  We had discussed decompression predominantly at 4-5 bilaterally if possibility and possibility of L3-4 extending cephalad, decompress completely at L4-5. He did have synovial cyst as well.  We elected to decompress 4-5 and decompress the 5 root without disturbing the pars or the listhesis distally which indicated to him would require a fusion or any surgical intervention that would address the back pain.  Discussed the risks and benefits including bleeding, infection, damage to neurovascular structure, CSF leak, epidural fibrosis, DVT, PE, anesthetic compensation, etc.  TECHNIQUE:  With the patient in supine position after induction of adequate  general anesthesia, 3 g Kefzol and Foley to gravity, was placed prone on the [place] frame.  All bony prominences were well padded. Lumbar region was prepped and draped in usual sterile fashion.  Two 18- gauge spinal needle was utilized to localize 4-5 interspace, confirmed with x-ray.  Incision was made from both spinous process 4 to just below 5, subcutaneous tissue was ample and was dissected.  Electrocautery was utilized to achieve hemostasis.  Dorsolumbar fascia identified and divided in line with skin incision.  Paraspinous muscle elevated from lamina 4-5 bilaterally.  Deep McCullough retractors were necessary and were placed.  I obtained confirmatory radiographs, wire localization due to his elevated BMI and listhesis, and small interlaminar window.  We identified the small interlaminar window at 4-5.  Pars below at 5. There was not hypermobility of this segment on the 4-5 facet.  Used a curette to skeletonize the lamina of 4 and of 5, though again not disturbing the fiber cartilaginous defect of the 5 pars.  Hemilaminotomy of the caudad edge of L4 was performed with 3-mm Kerrison cephalad to detach the ligamentum flavum.  A straight edge curette was utilized to detach the ligamentum flavum from the cephalad edge of 5, again staying onto the pars.  Neuro patty placed beneath the ligamentum flavum. Ligamentum flavum removed from the interspace, it was hypertrophic  facet laterally.  We identified the 5 root in the foramen, again without identifying the pars just to decompress the lateral recess in the proximal wall of the foramen.  Hypertrophic facet was noted.  We decompressed the lateral recess in the medial border of the pedicle laterally.  The large synovial cyst was encountered emanating from the 4- 5 cyst.  Contiguous with the facet.  Some adhesions were noted into the thecal sac.  Following that, used an 18-gauge spinal needles to aspirate the cyst, I got cystic gelatinous  fluid consistent with synovial cyst. I then developed a plane between the synovial cyst and a thecal sac in the 4 root with a Woodson retractor.  It was adhesed gently, and then we removed the synovial cyst, noted was a bleb just in the sleeve of the root where there was adhesions to the synovial cyst.  There was no active bleeding and no active CSF leakage with inspection.  The foraminotomy was then performed of 4 protecting the 4 root.  A neuro probe passed freely up the foramen of 4 and 5 following the decompression.  Due to this bleb measured about 2 to 3 mm on the lateral side of the thecal sac up near the axilla.  The root was intact.  I placed a Duraform patch measuring 1 cm x 2 cm over the area to incorporate the lateral aspect of the thecal sac, sleeve of the root.  I performed a Valsalva maneuver.  There was no active bleeding.  There were no active CSF leakages at 40 mmHg.  Dr. Darrelyn HillockGioffre was scrubbed in for visualization and inspection as well, they concur there was no active CSF leakage.  This was second Valsalva maneuver at 40 mm.  Then again inspected it distally, the pars was intact.  There was no disk herniation.  We used a Xeroform patch over the remainder of the thecal sac exposed.  I felt that this was the significant lesion on to the left and given that on the myelogram, there was compression of the 5 root on the left and not 4.  I felt any right-sided symptoms were secondary to displacement of the left-sided thecal sac and adhesions to the synovial cyst.  I therefore elected due to the duration of the case and the patient's elevated BMI that we would forego decompression on the right side, as again I feel there was appropriate mobilization of the thecal sac following the decompression.  The neuro probe had been placed above the pedicle of 4, I did not feel we need to decompress 3-4. Confirmatory radiograph obtained with a Woodson in the foramen of 5. Removed the  McCullough retractor after copious irrigation with antibiotic irrigation.  Thrombin-soaked Gelfoam was placed in laminotomy defect on top of the laminotomy defect.  Copiously irrigated the paraspinous musculature, closed the dorsolumbar fascia with #1 Vicryl interrupted figure-of-eight sutures, again challenging due to the patient's BMI.  I then oversew that with a running V-Loc, although this was for half of the duration for portion of the incision, multiple 2-0 layers were required for subcu and skin with staples.  Wound was dressed sterilely and placed supine on the hospital bed, extubated gently without difficulty and transported to the recovery room in satisfactory condition.  The patient tolerated the procedure well.  No complications.     Jene EveryJeffrey Jasmene Goswami, M.D.     Cordelia PenJB/MEDQ  D:  09/07/2015  T:  09/07/2015  Job:  [id number]

## 2015-09-07 NOTE — Discharge Instructions (Signed)

## 2015-09-07 NOTE — Transfer of Care (Signed)
Immediate Anesthesia Transfer of Care Note  Patient: Brent Duarte  Procedure(s) Performed: Procedure(s): MICRO LUMBAR DECOMPRESSION L4 - L5, REMOVAL OF SYNOVIAL CYST L4-L5 (N/A)  Patient Location: PACU  Anesthesia Type:General  Level of Consciousness: awake, alert  and oriented  Airway & Oxygen Therapy: Patient Spontanous Breathing and Patient connected to face mask oxygen  Post-op Assessment: Report given to RN, Post -op Vital signs reviewed and stable and Patient moving all extremities X 4  Post vital signs: Reviewed and stable  Last Vitals:  Filed Vitals:   09/07/15 0529  BP: 126/72  Pulse: 87  Temp: 36.6 C  Resp: 18    Complications: No apparent anesthesia complications

## 2015-09-07 NOTE — Interval H&P Note (Signed)
History and Physical Interval Note:  09/07/2015 7:25 AM  Brent Duarte Current  has presented today for surgery, with the diagnosis of stenosis L4 - L5  The various methods of treatment have been discussed with the patient and family. After consideration of risks, benefits and other options for treatment, the patient has consented to  Procedure(s): MICRO LUMBAR DECOMPRESSION L4 - L5 POSSIBLE L3 - L4  2 LEVELS (N/A) as a surgical intervention .  The patient's history has been reviewed, patient examined, no change in status, stable for surgery.  I have reviewed the patient's chart and labs.  Questions were answered to the patient's satisfaction.     Jared Whorley C

## 2015-09-07 NOTE — Brief Op Note (Signed)
09/07/2015  10:21 AM  PATIENT:  Brent Duarte  59 y.o. male  PRE-OPERATIVE DIAGNOSIS:  stenosis L4 - L5  POST-OPERATIVE DIAGNOSIS:  Spinal stenosis L4-L5, synovial cyst L4-L5  PROCEDURE:  Procedure(s): MICRO LUMBAR DECOMPRESSION L4 - L5, REMOVAL OF SYNOVIAL CYST L4-L5 (N/A)  SURGEON:  Surgeon(s) and Role:    * Jene EveryJeffrey Michal Strzelecki, MD - Primary    * Ranee Gosselinonald Gioffre, MD - Assisting  PHYSICIAN ASSISTANT:   ASSISTANTS: Bissell   ANESTHESIA:   general  EBL:  Total I/O In: 1000 [I.V.:1000] Out: 450 [Urine:350; Blood:100]  BLOOD ADMINISTERED:none  DRAINS: none   LOCAL MEDICATIONS USED:  MARCAINE     SPECIMEN:  No Specimen  DISPOSITION OF SPECIMEN:  N/A  COUNTS:  YES  TOURNIQUET:  * No tourniquets in log *  DICTATION: .Other Dictation: Dictation Number [id number]  PLAN OF CARE: Admit for overnight observation  PATIENT DISPOSITION:  PACU - hemodynamically stable.   Delay start of Pharmacological VTE agent (>24hrs) due to surgical blood loss or risk of bleeding: yes

## 2015-09-07 NOTE — H&P (View-Only) (Signed)
Brent Duarte is an 59 y.o. male.   Chief Complaint: back and left leg pain HPI: The patient is a 59 year old male who presents today for follow up of their back. The patient is being followed for their back pain. They are now year(s) out from when symptoms began. Symptoms reported today include: pain and numbness. Current treatment includes: home exercise program, activity modification, NSAIDs and pain medications. The following medication has been used for pain control: antiinflammatory medication (Advil) and Extra Strength Tylenol or Percocet (prn).  Subjective Transcription(Jeffrey Windy Kalata, MD; 08/17/2015 2:47 PM) Merry Proud follows up. His legs, especially his left is giving way and is weak on him, it radiates into his left leg. Past Medical History  Diagnosis Date  . Hypertension   . Hyperlipidemia   . Numbness and tingling of both legs   . Imbalance     secondary to back issues  . Recurrent falls   . Pneumonia   . Diabetes mellitus without complication ([place])   . History of kidney stones   . Multiple gastric ulcers   . Arthritis   . History of chicken pox   . History of measles   . History of mumps     Past Surgical History  Procedure Laterality Date  . Knee arthroscopy      bilat  . Bicep tear      right lower arm related to overgrowth of bone as stated per pt   . Lithrotripsy      for kidney stones  . Ureteral stent placement      secondary to kidney stones/pt states had laser surgery prior     No family history on file. Social History:  reports that he has never smoked. He has never used smokeless tobacco. He reports that he does not drink alcohol or use illicit drugs.  Allergies:  Allergies  Allergen Reactions  . Morphine And Related Other (See Comments)    Pt states does not work at relieving pain - states can not tell it works at all      (Not in a hospital admission)  Results for orders placed or performed during the hospital encounter of 08/31/15 (from  the past 48 hour(s))  Basic metabolic panel     Status: Abnormal   Collection Time: 08/31/15  8:50 AM  Result Value Ref Range   Sodium 138 135 - 145 mmol/L   Potassium 4.3 3.5 - 5.1 mmol/L   Chloride 105 101 - 111 mmol/L   CO2 26 22 - 32 mmol/L   Glucose, Bld 120 (H) 65 - 99 mg/dL   BUN 26 (H) 6 - 20 mg/dL   Creatinine, Ser 1.25 (H) 0.61 - 1.24 mg/dL   Calcium 9.0 8.9 - 10.3 mg/dL   GFR calc non Af Amer >60 >60 mL/min   GFR calc Af Amer >60 >60 mL/min    Comment: (NOTE) The eGFR has been calculated using the CKD EPI equation. This calculation has not been validated in all clinical situations. eGFR's persistently <60 mL/min signify possible Chronic Kidney Disease.    Anion gap 7 5 - 15  CBC     Status: None   Collection Time: 08/31/15  8:50 AM  Result Value Ref Range   WBC 6.1 4.0 - 10.5 K/uL   RBC 4.57 4.22 - 5.81 MIL/uL   Hemoglobin 14.3 13.0 - 17.0 g/dL   HCT 41.4 39.0 - 52.0 %   MCV 90.6 78.0 - 100.0 fL   MCH  31.3 26.0 - 34.0 pg   MCHC 34.5 30.0 - 36.0 g/dL   RDW 13.5 11.5 - 15.5 %   Platelets 172 150 - 400 K/uL  Surgical pcr screen     Status: None   Collection Time: 08/31/15  8:50 AM  Result Value Ref Range   MRSA, PCR NEGATIVE NEGATIVE   Staphylococcus aureus NEGATIVE NEGATIVE    Comment:        The Xpert SA Assay (FDA approved for NASAL specimens in patients over 26 years of age), is one component of a comprehensive surveillance program.  Test performance has been validated by [facility] for patients greater than or equal to 47 year old. It is not intended to diagnose infection nor to guide or monitor treatment.    Dg Lumbar Spine 2-3 Views  08/31/2015  CLINICAL DATA:  Right low back pain radiating down the leg and to the Celsius. EXAM: LUMBAR SPINE - 2-3 VIEW COMPARISON:  None. FINDINGS: There is no evidence of lumbar spine fracture. There is minimal grade 1 anterolisthesis of L5 on S1 secondary to bilateral L5 pars interarticularis defects. There is  mild degenerative disc disease with disc height loss at L5-S1. Mild bilateral facet arthropathy at L4-5. There are mild degenerative changes of bilateral sacroiliac joints. There is right nephrolithiasis. There is a 5 mm calcification projecting over the right psoas muscle which may reflect dystrophic calcification versus ureterolithiasis. IMPRESSION: 1. There is minimal grade 1 anterolisthesis of L5 on S1 secondary to bilateral L5 pars interarticularis defects. 2. There is mild degenerative disc disease with disc height loss at L5-S1. 3. There is right nephrolithiasis. There is a 5 mm calcification projecting over the right psoas muscle which may reflect dystrophic calcification versus ureterolithiasis. Electronically Signed   By: Kathreen Devoid   On: 08/31/2015 09:29    Review of Systems  Constitutional: Negative.   HENT: Negative.   Eyes: Negative.   Respiratory: Negative.   Cardiovascular: Negative.   Gastrointestinal: Negative.   Genitourinary: Negative.   Musculoskeletal: Positive for back pain.  Skin: Negative.   Neurological: Positive for sensory change and focal weakness.  Psychiatric/Behavioral: Negative.     There were no vitals taken for this visit. Physical Exam  Constitutional: He is oriented to person, place, and time. He appears well-developed and well-nourished.  HENT:  Head: Normocephalic and atraumatic.  Eyes: Pupils are equal, round, and reactive to light.  Neck: Normal range of motion.  Cardiovascular: Normal rate.   Respiratory: Effort normal.  GI: Soft.  Musculoskeletal:  Straight leg raise buttock, thigh and calf pain on the left, buttock pain on the right. EHL is 5-/5 on the left compared to the right. Pain with extension and flexion of the lumbar spine.  Lumbar spine exam reveals no evidence of soft tissue swelling, deformity or skin ecchymosis. On palpation there is no tenderness of the lumbar spine. No flank pain with percussion. The abdomen is soft and  nontender. Nontender over the trochanters. No cellulitis or lymphadenopathy.  Motor is 5/5 including tibialis anterior, plantar flexion, quadriceps and hamstrings. Patient is normoreflexic. There is no Babinski or clonus. Sensory exam is intact to light touch. Patient has good distal pulses. No DVT. No pain and normal range of motion without instability of the hips, knees and ankles.  Neurological: He is alert and oriented to person, place, and time.  Skin: Skin is warm and dry.  Psychiatric: He has a normal mood and affect.     He does have  stenosis on his myelogram that was significant in the upright position. He has some degenerative changes in the sacroiliac joint as well bilaterally has disc degeneration at 5-1. He has listhesis at 5-1 without instability in flexion and extension. No myelogram defect at that level. At 4-5, he has facet arthropathy, ligamentum flavum hypertrophy and deviation of the descending 5 root. There is mass effect in the left subarticular zone displacing the 5 root in the lateral recess on his myelogram. Disc herniation at 4-5 as well. We talked about synovial cyst arising from the left facet joint. Nerve sheath tumor considered unlikely. Right subarticular zone appears to be intact.  There is some degenerative changes at 3-4 as well.  Assessment/Plan Lumbar spinal stenosis L4-5 1. Persisting lower extremity radicular pain, particularly left given facet hypertrophy and possible synovial cyst. 2. Degenerative isthmic spondylolisthesis at 5-1. No evidence of any flexion and extension. Myelogram demonstrated no neural compressive lesion. 3. Possible synovial cyst at 4-5.   We had extensive discussion concerning current pathology, relevant anatomy and treatment options. I feel since he has had temporary relief from an epidural, we will consider to proceed with decompression at 4-5.  I had an extensive discussion of the risks and benefits of the lumbar decompression with  the patient including bleeding, infection, damage to neurovascular structures, epidural fibrosis, CSF leak requiring repair. We also discussed increase in pain, adjacent segment disease, recurrent disc herniation, need for future surgery including repeat decompression and/or fusion. We also discussed risks of postoperative hematoma, paralysis, anesthetic complications including DVT, PE, death, cardiopulmonary dysfunction. In addition, the perioperative and postoperative courses were discussed in detail including the rehabilitative time and return to functional activity and work. I provided the patient with an illustrated handout and utilized the appropriate surgical models.  He does have straight leg raise today and EHL weakness as well. Possible 3-4 decompression. We discussed residual back pain potentially requiring fusion in the future. We discussed this particularly at L5-S1 and he understands that as well as SI joint arthrosis which could be giving him symptomatology as well. Overnight in the hospital, preoperative clearance, history of staph, not MRSA, able to take Kefzol. Proceed accordingly. He does have a history of gout as well. He occasionally takes colchicine as well as a history of psoriasis.  Plan microlumbar decompression L4-5, possible L3-4  BISSELL, JACLYN M. PA-C for Dr. Tonita Cong 09/01/2015, 2:14 PM

## 2015-09-07 NOTE — Anesthesia Postprocedure Evaluation (Addendum)
Anesthesia Post Note  Patient: Brent AlbrightJames P Duarte  Procedure(s) Performed: Procedure(s) (LRB): MICRO LUMBAR DECOMPRESSION L4 - L5, REMOVAL OF SYNOVIAL CYST L4-L5 (N/A)  Patient location during evaluation: PACU Anesthesia Type: General Level of consciousness: awake and alert Pain management: pain level controlled Vital Signs Assessment: post-procedure vital signs reviewed and stable Respiratory status: spontaneous breathing, nonlabored ventilation, respiratory function stable and patient connected to nasal cannula oxygen Cardiovascular status: blood pressure returned to baseline and stable Postop Assessment: No signs of nausea or vomiting Anesthetic complications: no    Last Vitals:  Filed Vitals:   09/07/15 1130 09/07/15 1200  BP:  127/83  Pulse:  103  Temp: 36.7 C 36.9 C  Resp:  21    Last Pain:  Filed Vitals:   09/07/15 1206  PainSc: 1     LLE Motor Response: Purposeful movement LLE Sensation: No numbness, No tingling RLE Motor Response: Purposeful movement RLE Sensation: No numbness, No tingling      Stevon Gough L

## 2015-09-07 NOTE — Anesthesia Procedure Notes (Signed)
Procedure Name: Intubation Date/Time: 09/07/2015 7:39 AM Performed by: Enriqueta ShutterWILLIFORD, Mathius Birkeland D Pre-anesthesia Checklist: Patient identified, Emergency Drugs available, Suction available and Patient being monitored Patient Re-evaluated:Patient Re-evaluated prior to inductionOxygen Delivery Method: Circle System Utilized Preoxygenation: Pre-oxygenation with 100% oxygen Intubation Type: IV induction Ventilation: Mask ventilation without difficulty Laryngoscope Size: Mac and 4 Grade View: Grade II Tube type: Oral Tube size: 7.5 mm Number of attempts: 1 Airway Equipment and Method: Stylet and Oral airway Placement Confirmation: ETT inserted through vocal cords under direct vision,  positive ETCO2 and breath sounds checked- equal and bilateral Secured at: 22 cm Tube secured with: Tape Dental Injury: Teeth and Oropharynx as per pre-operative assessment

## 2015-09-07 NOTE — Progress Notes (Signed)
Was informed by pacu nurse that Dr. Shelle IronBeane stated he wanted the patient to lie flat after surgery. RN called his PA, Annice PihJackie, to clarify specifics of this order. RN told to sit patient up no more than 30 degrees and for patient not to get out of bed day of surgery. Therapy may sit patient on the side of the bed tomorrow (11/24) AM. If patient does well, by afternoon, PT can progress normally. Information repeated back to PA, and the patient is aware of restrictions.

## 2015-09-08 DIAGNOSIS — M4806 Spinal stenosis, lumbar region: Secondary | ICD-10-CM | POA: Diagnosis not present

## 2015-09-08 LAB — GLUCOSE, CAPILLARY: GLUCOSE-CAPILLARY: 130 mg/dL — AB (ref 65–99)

## 2015-09-08 NOTE — Evaluation (Signed)
Occupational Therapy Evaluation Patient Details Name: Brent Duarte MRN: [id number] DOB: 07/26/1956 Today's Date: 09/08/2015    History of Present Illness 59 y.o. male admitted for L4-5 lumbar decompression. Pt reports 7 falls PTA due to BLEs giving out.    Clinical Impression   Pt was admitted for the above surgery.  All education was completed. No further OT is needed at this time    Follow Up Recommendations  No OT follow up    Equipment Recommendations  None recommended by OT    Recommendations for Other Services       Precautions / Restrictions Precautions Precautions: Back;Fall Precaution Booklet Issued: Yes (comment) Precaution Comments: 7 falls PTA 2* BLEs giving out--pt reports at end of day Restrictions Weight Bearing Restrictions: No      Mobility Bed Mobility           General bed mobility comments: pt at eob  Transfers Overall transfer level: Needs assistance Equipment used: None Transfers: Sit to/from Stand Sit to Stand: Modified independent (Device/Increase time)         General transfer comment: no cues needed; pt kept back straight    Balance Overall balance assessment: Needs assistance Sitting-balance support: No upper extremity supported;Feet supported Sitting balance-Leahy Scale: Good                                      ADL Overall ADL's : Needs assistance/impaired                                       General ADL Comments: pt is able to perform UB adls with set up; LB bathing with set up sit to stand and LB dressing with set up using sock aide, sit to stand.  He can cross legs comfortably, without pain or pulling for adls. Gave resource list for wide sock aide and toilet aide (if needed).  Pt states his commode is higher than our comfort height commode.  He had no difficulty getting up from EOB.  Min guard ambulating due to h/o legs buckling. Pt feels comfortable sidestepping into tub:  This is  what he has been doing prior to sx     Vision     Perception     Praxis      Pertinent Vitals/Pain Pain Assessment: 0-10 Pain Score: 3  Pain Location: legs Pain Descriptors / Indicators: Discomfort Pain Intervention(s): Limited activity within patient's tolerance;Monitored during session;Repositioned     Hand Dominance     Extremity/Trunk Assessment Upper Extremity Assessment Upper Extremity Assessment: Overall WFL for tasks assessed      Cervical / Trunk Assessment Cervical / Trunk Assessment: Normal   Communication Communication Communication: No difficulties   Cognition Arousal/Alertness: Awake/alert Behavior During Therapy: WFL for tasks assessed/performed Overall Cognitive Status: Within Functional Limits for tasks assessed                     General Comments       Exercises       Shoulder Instructions      Home Living Family/patient expects to be discharged to:: Private residence Living Arrangements: Spouse/significant other Available Help at Discharge: Available 24 hours/day   Home Access: Stairs to enter Entergy CorporationEntrance Stairs-Number of Steps: 3 Entrance Stairs-Rails: None Home Layout: One level     Bathroom  Shower/Tub: Tub/shower unit Shower/tub characteristics: Engineer, building services: Handicapped height (highest toilet available)     Home Equipment: None          Prior Functioning/Environment Level of Independence: Independent        Comments: struggled with socks    OT Diagnosis: Generalized weakness   OT Problem List:     OT Treatment/Interventions:      OT Goals(Current goals can be found in the care plan section) Acute Rehab OT Goals Patient Stated Goal: return to work  OT Frequency:     Barriers to D/C:            Co-evaluation              End of Session    Activity Tolerance: Patient tolerated treatment well Patient left: in chair;with call bell/phone within reach;with family/visitor present    Time: 1245-1259 OT Time Calculation (min): 14 min Charges:  OT General Charges $OT Visit: 1 Procedure OT Evaluation $Initial OT Evaluation Tier I: 1 Procedure G-Codes: OT G-codes **NOT FOR INPATIENT CLASS** Functional Assessment Tool Used: clinical judgment and observation Functional Limitation: Self care Self Care Current Status (W0981): At least 1 percent but less than 20 percent impaired, limited or restricted Self Care Goal Status (X9147): At least 1 percent but less than 20 percent impaired, limited or restricted Self Care Discharge Status [phone]): At least 1 percent but less than 20 percent impaired, limited or restricted  Brent Duarte 09/08/2015, 1:47 PM  Brent Duarte, OTR/L [id number] 09/08/2015

## 2015-09-08 NOTE — Evaluation (Addendum)
Physical Therapy Evaluation Patient Details Name: Brent Duarte MRN: [id number] DOB: 07/17/1956 Today's Date: 09/08/2015   History of Present Illness  59 y.o. male admitted for L4-5 lumbar decompression and removal of synovial cyst. Pt reports 7 falls PTA due to BLEs giving out.   Clinical Impression  Patient is s/p above surgery resulting in the deficits listed below (see PT Problem List). Pt tolerated rolling and sidelying to sit without difficulty, he sat on EOB x 10 min without dizziness or headache. Will assess ambulation this afternoon. Instructed pt in back precautions.  BP supine 123/81, sitting 140/90. Patient will benefit from skilled PT to increase their independence and safety with mobility (while adhering to their precautions) to allow discharge to the venue listed below.     Follow Up Recommendations No PT follow up    Equipment Recommendations  Other (comment) (to be determined during afternoon PT session)    Recommendations for Other Services       Precautions / Restrictions Precautions Precautions: Back;Fall Precaution Booklet Issued: Yes (comment) Precaution Comments: 7 falls PTA 2* BLEs giving out Restrictions Weight Bearing Restrictions: No      Mobility  Bed Mobility Overal bed mobility: Needs Assistance Bed Mobility: Rolling;Sidelying to Sit Rolling: Supervision Sidelying to sit: Supervision       General bed mobility comments: verbal cues for technique for log roll, HOB up 30* (per Dr's orders); pt sat on EOB x 10 minutes without dizziness, no headache  Transfers                 General transfer comment: NT- per Dr Shelle IronBeane, pt to sit EOB only this morning, then attempt ambulation this afternoon if he does well  Ambulation/Gait                Stairs            Wheelchair Mobility    Modified Rankin (Stroke Patients Only)       Balance Overall balance assessment: Needs assistance Sitting-balance support: No upper  extremity supported;Feet supported Sitting balance-Leahy Scale: Good                                       Pertinent Vitals/Pain Pain Assessment: 0-10 Pain Score: 1  Pain Location: surgical site with activity Pain Descriptors / Indicators: Sore Pain Intervention(s): Monitored during session    Home Living Family/patient expects to be discharged to:: Private residence Living Arrangements: Spouse/significant other Available Help at Discharge: Available 24 hours/day   Home Access: Stairs to enter Entrance Stairs-Rails: None Entrance Stairs-Number of Steps: 3 Home Layout: One level Home Equipment: None      Prior Function Level of Independence: Independent               Hand Dominance        Extremity/Trunk Assessment   Upper Extremity Assessment: Overall WFL for tasks assessed           Lower Extremity Assessment: Overall WFL for tasks assessed (decreased sensation to light touch B medial feet, strength intact BLEs)      Cervical / Trunk Assessment: Normal  Communication   Communication: No difficulties  Cognition Arousal/Alertness: Awake/alert Behavior During Therapy: WFL for tasks assessed/performed Overall Cognitive Status: Within Functional Limits for tasks assessed                      General Comments  Exercises        Assessment/Plan    PT Assessment Patient needs continued PT services  PT Diagnosis Acute pain   PT Problem List Decreased activity tolerance;Pain;Decreased mobility;Decreased knowledge of precautions  PT Treatment Interventions Gait training;Stair training;Functional mobility training;Therapeutic activities;Patient/family education;Therapeutic exercise   PT Goals (Current goals can be found in the Care Plan section) Acute Rehab PT Goals Patient Stated Goal: return to work PT Goal Formulation: With patient Time For Goal Achievement: 09/11/15 Potential to Achieve Goals: Good    Frequency  7X/week   Barriers to discharge        Co-evaluation               End of Session   Activity Tolerance: Patient tolerated treatment well Patient left: in bed;with call bell/phone within reach Nurse Communication: Mobility status    Functional Assessment Tool Used: clinical judgement Functional Limitation: Mobility: Walking and moving around Mobility: Walking and Moving Around Current Status (Z6109): At least 1 percent but less than 20 percent impaired, limited or restricted Mobility: Walking and Moving Around Goal Status [phone]): At least 1 percent but less than 20 percent impaired, limited or restricted    Time: 1002-1016 PT Time Calculation (min) (ACUTE ONLY): 14 min   Charges:   PT Evaluation $Initial PT Evaluation Tier I: 1 Procedure     PT G Codes:   PT G-Codes **NOT FOR INPATIENT CLASS** Functional Assessment Tool Used: clinical judgement Functional Limitation: Mobility: Walking and moving around Mobility: Walking and Moving Around Current Status (U9811): At least 1 percent but less than 20 percent impaired, limited or restricted Mobility: Walking and Moving Around Goal Status [phone]): At least 1 percent but less than 20 percent impaired, limited or restricted    Tamala Ser 09/08/2015, 10:31 AM [phone]

## 2015-09-08 NOTE — Progress Notes (Signed)
   09/08/15 1400  PT Visit Information  Last PT Received On 09/08/15  Assistance Needed +1  History of Present Illness 59 y.o. male admitted for L4-5 lumbar decompression. Pt reports 7 falls PTA due to BLEs giving out.   PT Time Calculation  PT Start Time (ACUTE ONLY) 1239  PT Stop Time (ACUTE ONLY) 1252  PT Time Calculation (min) (ACUTE ONLY) 13 min  Subjective Data  Subjective I feel good, legs hurt some  Patient Stated Goal return to work  Precautions  Precautions Back;Fall  Precaution Comments 7 falls PTA 2* BLEs giving out--pt reports at end of day; reviewed back precautions with pt and fam, pt able to verbalize 2/3   Pain Assessment  Pain Score 5  Pain Location legs  Pain Descriptors / Indicators Discomfort;Sore  Pain Intervention(s) Limited activity within patient's tolerance;Monitored during session;Repositioned  Cognition  Arousal/Alertness Awake/alert  Behavior During Therapy WFL for tasks assessed/performed  Overall Cognitive Status Within Functional Limits for tasks assessed  Bed Mobility  General bed mobility comments pt at eob  Transfers  Overall transfer level Needs assistance  Equipment used Rolling walker (2 wheeled);None  Transfers Sit to/from Stand  Sit to Stand Supervision;Modified independent (Device/Increase time)  General transfer comment cuesf or use of LEs to power up, pt mod I end of session  Ambulation/Gait  Ambulation/Gait assistance Supervision;Min guard  Ambulation Distance (Feet) 240 Feet (20' without AD, with min/guard)  Assistive device Rolling walker (2 wheeled);None  Gait Pattern/deviations Step-through pattern;Decreased stride length  General Gait Details cues for back precautions, RW safety with turns; requires min to min/guard amb without RW, slightly shaky without added support  Stairs Yes  Stairs assistance Supervision  Stair Management One rail Right;One rail Left;Backwards;Forwards;Alternating pattern  Number of Stairs 5  General  stair comments pt using technique backing down steps prior to surgery and practices this technique  today without difficulty  PT - End of Session  Equipment Utilized During Treatment Gait belt  Activity Tolerance Patient tolerated treatment well;No increased pain  Patient left in chair;with call bell/phone within reach;with family/visitor present  Nurse Communication Mobility status  PT - Assessment/Plan  PT Plan Current plan remains appropriate  PT Frequency (ACUTE ONLY) 7X/week  Follow Up Recommendations No PT follow up  PT equipment Rolling walker with 5" wheels  PT Goal Progression  Progress towards PT goals Progressing toward goals  Acute Rehab PT Goals  PT Goal Formulation With patient  Time For Goal Achievement 09/11/15  Potential to Achieve Goals Good  PT General Charges  $$ ACUTE PT VISIT 1 Procedure  PT Treatments  $Gait Training 8-22 mins

## 2015-09-08 NOTE — Progress Notes (Signed)
Subjective: 1 Day Post-Op Procedure(s) (LRB): MICRO LUMBAR DECOMPRESSION L4 - L5, REMOVAL OF SYNOVIAL CYST L4-L5 (N/A) Patient reports pain as 3 on 0-10 scale.   No HA or nausea Objective: Vital signs in last 24 hours: Temp:  [97.3 F (36.3 C)-98.7 F (37.1 C)] 98.2 F (36.8 C) (11/24 0615) Pulse Rate:  [94-103] 96 (11/23 1800) Resp:  [8-26] 26 (11/24 0615) BP: (113-135)/(65-94) 113/69 mmHg (11/24 0615) SpO2:  [96 %-100 %] 98 % (11/24 0615)  Intake/Output from previous day: 11/23 0701 - 11/24 0700 In: 3944.2 [P.O.:1080; I.V.:2764.2; IV Piggyback:100] Out: 3400 [Urine:3300; Blood:100] Intake/Output this shift:    No results for input(s): HGB in the last 72 hours. No results for input(s): WBC, RBC, HCT, PLT in the last 72 hours. No results for input(s): NA, K, CL, CO2, BUN, CREATININE, GLUCOSE, CALCIUM in the last 72 hours. No results for input(s): LABPT, INR in the last 72 hours.  Neurologically intact Neurovascular intact Sensation intact distally Dorsiflexion/Plantar flexion intact Compartment soft Motor 5/5. Able SLR. Assessment/Plan: 1 Day Post-Op Procedure(s) (LRB): MICRO LUMBAR DECOMPRESSION L4 - L5, REMOVAL OF SYNOVIAL CYST L4-L5 (N/A) Advance diet Up with therapy D/C IV fluids Discharge home with home health possible today if does well in PT Discussed OR incl patch. No HA Brent Duarte C 09/08/2015, 7:37 AM

## 2015-09-12 NOTE — Discharge Summary (Signed)
Physician Discharge Summary   Patient ID: Brent Duarte MRN: [id number] DOB/AGE: [birth year] 59 y.o.  Admit date: 09/07/2015 Discharge date: 09/12/2015  Primary Diagnosis:   stenosis L4 - L5  Admission Diagnoses:  Past Medical History  Diagnosis Date  . Hypertension   . Hyperlipidemia   . Numbness and tingling of both legs   . Imbalance     secondary to back issues  . Recurrent falls   . Pneumonia   . Diabetes mellitus without complication ([place])   . History of kidney stones   . Multiple gastric ulcers   . Arthritis   . History of chicken pox   . History of measles   . History of mumps    Discharge Diagnoses:   Principal Problem:   Spinal stenosis of lumbar region  Procedure:  Procedure(s) (LRB): MICRO LUMBAR DECOMPRESSION L4 - L5, REMOVAL OF SYNOVIAL CYST L4-L5 (N/A)   Consults: None  HPI:  see H&P    Laboratory Data: Hospital Outpatient Visit on 08/31/2015  Component Date Value Ref Range Status  . Sodium 08/31/2015 138  135 - 145 mmol/L Final  . Potassium 08/31/2015 4.3  3.5 - 5.1 mmol/L Final  . Chloride 08/31/2015 105  101 - 111 mmol/L Final  . CO2 08/31/2015 26  22 - 32 mmol/L Final  . Glucose, Bld 08/31/2015 120* 65 - 99 mg/dL Final  . BUN 08/31/2015 26* 6 - 20 mg/dL Final  . Creatinine, Ser 08/31/2015 1.25* 0.61 - 1.24 mg/dL Final  . Calcium 08/31/2015 9.0  8.9 - 10.3 mg/dL Final  . GFR calc non Af Amer 08/31/2015 >60  >60 mL/min Final  . GFR calc Af Amer 08/31/2015 >60  >60 mL/min Final   Comment: (NOTE) The eGFR has been calculated using the CKD EPI equation. This calculation has not been validated in all clinical situations. eGFR's persistently <60 mL/min signify possible Chronic Kidney Disease.   . Anion gap 08/31/2015 7  5 - 15 Final  . WBC 08/31/2015 6.1  4.0 - 10.5 K/uL Final  . RBC 08/31/2015 4.57  4.22 - 5.81 MIL/uL Final  . Hemoglobin 08/31/2015 14.3  13.0 - 17.0 g/dL Final  . HCT 08/31/2015 41.4  39.0 - 52.0 % Final  . MCV 08/31/2015  90.6  78.0 - 100.0 fL Final  . MCH 08/31/2015 31.3  26.0 - 34.0 pg Final  . MCHC 08/31/2015 34.5  30.0 - 36.0 g/dL Final  . RDW 08/31/2015 13.5  11.5 - 15.5 % Final  . Platelets 08/31/2015 172  150 - 400 K/uL Final  . MRSA, PCR 08/31/2015 NEGATIVE  NEGATIVE Final  . Staphylococcus aureus 08/31/2015 NEGATIVE  NEGATIVE Final   Comment:        The Xpert SA Assay (FDA approved for NASAL specimens in patients over 73 years of age), is one component of a comprehensive surveillance program.  Test performance has been validated by [facility] for patients greater than or equal to 60 year old. It is not intended to diagnose infection nor to guide or monitor treatment.    No results for input(s): HGB in the last 72 hours. No results for input(s): WBC, RBC, HCT, PLT in the last 72 hours. No results for input(s): NA, K, CL, CO2, BUN, CREATININE, GLUCOSE, CALCIUM in the last 72 hours. No results for input(s): LABPT, INR in the last 72 hours.  X-Rays:Dg Lumbar Spine 2-3 Views  08/31/2015  CLINICAL DATA:  Right low back pain radiating down the leg and to the Celsius.  EXAM: LUMBAR SPINE - 2-3 VIEW COMPARISON:  None. FINDINGS: There is no evidence of lumbar spine fracture. There is minimal grade 1 anterolisthesis of L5 on S1 secondary to bilateral L5 pars interarticularis defects. There is mild degenerative disc disease with disc height loss at L5-S1. Mild bilateral facet arthropathy at L4-5. There are mild degenerative changes of bilateral sacroiliac joints. There is right nephrolithiasis. There is a 5 mm calcification projecting over the right psoas muscle which may reflect dystrophic calcification versus ureterolithiasis. IMPRESSION: 1. There is minimal grade 1 anterolisthesis of L5 on S1 secondary to bilateral L5 pars interarticularis defects. 2. There is mild degenerative disc disease with disc height loss at L5-S1. 3. There is right nephrolithiasis. There is a 5 mm calcification projecting over the  right psoas muscle which may reflect dystrophic calcification versus ureterolithiasis. Electronically Signed   By: Kathreen Devoid   On: 08/31/2015 09:29   Dg Spine Portable 1 View  09/07/2015  CLINICAL DATA:  L4-5 surgery EXAM: PORTABLE SPINE - 1 VIEW COMPARISON:  08/31/2015 FINDINGS: Posterior localizing instrument is directed at the pedicles of L4. IMPRESSION: Intraoperative localization as above. Electronically Signed   By: Rolm Baptise M.D.   On: 09/07/2015 09:48   Dg Spine Portable 1 View  09/07/2015  CLINICAL DATA:  L4-5 surgery EXAM: PORTABLE SPINE - 1 VIEW COMPARISON:  08/31/2015 FINDINGS: Posterior surgical instruments are in place extending from the L3 spinous process 2 in instrument directed at the L4-5 disc space. There is also a posterior surgical instrument directed at the L4 pedicles. IMPRESSION: Intraoperative localization as above. Electronically Signed   By: Rolm Baptise M.D.   On: 09/07/2015 08:46   Dg Spine Portable 1 View  09/07/2015  CLINICAL DATA:  L4-5 surgery. EXAM: PORTABLE SPINE - 1 VIEW COMPARISON:  Lumbar spine series 08/31/2015. FINDINGS: A single intraoperative cross-table lateral view of the lumbar spine is submitted. Numbering system utilized on 08/31/2015 is preserved. Surgical instrument tips are seen in the soft tissues posterior to L5 and S1. Multilevel endplate degenerative changes. L5 pars defects. IMPRESSION: Intraoperative localization at L5 and S1. Electronically Signed   By: Lorin Picket M.D.   On: 09/07/2015 08:16    EKG:No orders found for this or any previous visit.   Hospital Course: Patient was admitted to [facility] and taken to the OR and underwent the above state procedure without complications.  Patient tolerated the procedure well and was later transferred to the recovery room and then to the orthopaedic floor for postoperative care.  They were given PO and IV analgesics for pain control following their surgery.  They were given 24  hours of postoperative antibiotics.   PT was consulted postop to assist with mobility and transfers.  The patient was allowed to be WBAT with therapy and was taught back precautions. Discharge planning was consulted to help with postop disposition and equipment needs.  Patient had a good night on the evening of surgery and started to get up OOB with therapy on day one. Patient was seen in rounds and was ready to go home on day one.  They were given discharge instructions and dressing directions.  They were instructed on when to follow up in the office with Dr. Tonita Cong.   Diet: Diabetic diet Activity:WBAT; Lspine precautions Follow-up:in 10-14 days Disposition - Home Discharged Condition: good   Discharge Instructions    Call MD / Call 911    Complete by:  As directed   If you experience chest  pain or shortness of breath, CALL 911 and be transported to the hospital emergency room.  If you develope a fever above 101 F, pus (white drainage) or increased drainage or redness at the wound, or calf pain, call your surgeon's office.     Constipation Prevention    Complete by:  As directed   Drink plenty of fluids.  Prune juice may be helpful.  You may use a stool softener, such as Colace (over the counter) 100 mg twice a day.  Use MiraLax (over the counter) for constipation as needed.     Diet - low sodium heart healthy    Complete by:  As directed      Increase activity slowly as tolerated    Complete by:  As directed             Medication List    STOP taking these medications        oxyCODONE-acetaminophen 5-325 MG tablet  Commonly known as:  PERCOCET/ROXICET  Replaced by:  oxyCODONE-acetaminophen 10-325 MG tablet      TAKE these medications        albuterol 108 (90 BASE) MCG/ACT inhaler  Commonly known as:  PROVENTIL HFA;VENTOLIN HFA  Inhale 2 puffs into the lungs every 6 (six) hours as needed for wheezing or shortness of breath.     allopurinol 300 MG tablet  Commonly known as:   ZYLOPRIM  Take 300 mg by mouth at bedtime.     clobetasol ointment 0.05 %  Commonly known as:  TEMOVATE  Apply 1 application topically 3 (three) times daily.     docusate sodium 100 MG capsule  Commonly known as:  COLACE  Take 1 capsule (100 mg total) by mouth 2 (two) times daily as needed for mild constipation.     guaiFENesin 600 MG 12 hr tablet  Commonly known as:  MUCINEX  Take 1,200 mg by mouth 2 (two) times daily as needed for cough.     lisinopril 5 MG tablet  Commonly known as:  PRINIVIL,ZESTRIL  Take 5 mg by mouth at bedtime.     metFORMIN 500 MG tablet  Commonly known as:  GLUCOPHAGE  Take 500 mg by mouth 2 (two) times daily with a meal.     methocarbamol 500 MG tablet  Commonly known as:  ROBAXIN  Take 1 tablet (500 mg total) by mouth every 8 (eight) hours as needed for muscle spasms.     oxyCODONE-acetaminophen 10-325 MG tablet  Commonly known as:  PERCOCET  Take 1 tablet by mouth every 4 (four) hours as needed for pain.     pravastatin 40 MG tablet  Commonly known as:  PRAVACHOL  Take 40 mg by mouth at bedtime.     PRESCRIPTION MEDICATION  Apply 1 application topically 2 (two) times daily. Calcitriol ointment 55mg/g. Verified with [facility] [place]     testosterone cypionate 200 MG/ML injection  Commonly known as:  DEPOTESTOSTERONE CYPIONATE  Inject 200 mg into the muscle every 30 (thirty) days.           Follow-up Information    Follow up with BEANE,JEFFREY C, MD In 2 weeks.   Specialty:  Orthopedic Surgery   Why:  For suture removal   Contact information:   [phone]      Signed: JLacie Draft PA-C Orthopaedic Surgery 09/12/2015, 8:23 AM

## 2016-02-08 DIAGNOSIS — K8010 Calculus of gallbladder with chronic cholecystitis without obstruction: Secondary | ICD-10-CM | POA: Insufficient documentation

## 2016-08-18 LAB — HM COLONOSCOPY

## 2018-01-22 ENCOUNTER — Ambulatory Visit (INDEPENDENT_AMBULATORY_CARE_PROVIDER_SITE_OTHER): Payer: 59 | Admitting: Endocrinology

## 2018-01-22 ENCOUNTER — Encounter: Payer: Self-pay | Admitting: Endocrinology

## 2018-01-22 DIAGNOSIS — E119 Type 2 diabetes mellitus without complications: Secondary | ICD-10-CM

## 2018-01-22 MED ORDER — GLIMEPIRIDE 4 MG PO TABS: 4.0000 mg | ORAL_TABLET | Freq: Every day | ORAL | 3 refills | Status: DC

## 2018-01-22 NOTE — Progress Notes (Signed)
Subjective:    Patient ID: Brent Duarte, male    DOB: 07/06/1956, 62 y.o.   MRN: [id number]  HPI pt is referred by Dr Marina Goodell, for diabetes.  Pt states DM was dx'ed in 2017; he has mild if any neuropathy of the lower extremities; he is unaware of any associated chronic complications; he has never been on insulin; pt says his diet and exercise are poor; he has never had pancreatic surgery, severe hypoglycemia or DKA.  He had pancreatitis with trulicity.  He had polyuria with jardiance.  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  He takes janumet only.  He says cbg's are in the 200's.   Past Medical History:  Diagnosis Date  . Arthritis   . Diabetes mellitus without complication (HCC)   . History of chicken pox   . History of kidney stones   . History of measles   . History of mumps   . Hyperlipidemia   . Hypertension   . Imbalance    secondary to back issues  . Multiple gastric ulcers   . Numbness and tingling of both legs   . Pneumonia   . Recurrent falls     Past Surgical History:  Procedure Laterality Date  . bicep tear     right lower arm related to overgrowth of bone as stated per pt   . KNEE ARTHROSCOPY     bilat  . lithrotripsy     for kidney stones  . LUMBAR LAMINECTOMY/DECOMPRESSION MICRODISCECTOMY N/A 09/07/2015   Procedure: MICRO LUMBAR DECOMPRESSION L4 - L5, REMOVAL OF SYNOVIAL CYST L4-L5;  Surgeon: Jene Every, MD;  Location: [facility];  Service: Orthopedics;  Laterality: N/A;  . URETERAL STENT PLACEMENT     secondary to kidney stones/pt states had laser surgery prior     Social History   Socioeconomic History  . Marital status: Married    Spouse name: Not on file  . Number of children: Not on file  . Years of education: Not on file  . Highest education level: Not on file  Occupational History  . Not on file  Social Needs  . Financial resource strain: Not on file  . Food insecurity:    Worry: Not on file    Inability: Not on file  .  Transportation needs:    Medical: Not on file    Non-medical: Not on file  Tobacco Use  . Smoking status: Never Smoker  . Smokeless tobacco: Never Used  Substance and Sexual Activity  . Alcohol use: No  . Drug use: No  . Sexual activity: Not on file  Lifestyle  . Physical activity:    Days per week: Not on file    Minutes per session: Not on file  . Stress: Not on file  Relationships  . Social connections:    Talks on phone: Not on file    Gets together: Not on file    Attends religious service: Not on file    Active member of club or organization: Not on file    Attends meetings of clubs or organizations: Not on file    Relationship status: Not on file  . Intimate partner violence:    Fear of current or ex partner: Not on file    Emotionally abused: Not on file    Physically abused: Not on file    Forced sexual activity: Not on file  Other Topics Concern  . Not on file  Social History Narrative  .  Not on file    Current Outpatient Medications on File Prior to Visit  Medication Sig Dispense Refill  . allopurinol (ZYLOPRIM) 300 MG tablet Take 300 mg by mouth at bedtime.    Marland Kitchen atorvastatin (LIPITOR) 40 MG tablet Take 40 mg by mouth daily.  3  . clobetasol ointment (TEMOVATE) 0.05 % Apply 1 application topically 3 (three) times daily.    Marland Kitchen docusate sodium (COLACE) 100 MG capsule Take 1 capsule (100 mg total) by mouth 2 (two) times daily as needed for mild constipation. (Patient not taking: Reported on 01/22/2018) 20 capsule 1  . Dupilumab, Asthma, (DUPIXENT [place]) Take 300 mg by mouth once a week. Every other week    . gabapentin (NEURONTIN) 300 MG capsule Take 300 mg by mouth 3 (three) times daily.    Marland Kitchen guaiFENesin (MUCINEX) 600 MG 12 hr tablet Take 1,200 mg by mouth 2 (two) times daily as needed for cough.    Marland Kitchen lisinopril (PRINIVIL,ZESTRIL) 5 MG tablet Take 5 mg by mouth at bedtime.     . methocarbamol (ROBAXIN) 500 MG tablet Take 1 tablet (500 mg total) by mouth every 8 (eight)  hours as needed for muscle spasms. (Patient not taking: Reported on 01/22/2018) 60 tablet 1  . oxyCODONE-acetaminophen (PERCOCET) 10-325 MG tablet Take 1 tablet by mouth every 4 (four) hours as needed for pain. 60 tablet 0  . oxyCODONE-acetaminophen (PERCOCET/ROXICET) 5-325 MG tablet Take 5-325 tablets by mouth every 6 (six) hours as needed.    . pravastatin (PRAVACHOL) 40 MG tablet Take 40 mg by mouth at bedtime.     Marland Kitchen PRESCRIPTION MEDICATION Apply 1 application topically 2 (two) times daily. Calcitriol ointment 2mcg/g. Verified with [facility] in [place].    Marland Kitchen testosterone cypionate (DEPOTESTOSTERONE CYPIONATE) 200 MG/ML injection Inject 200 mg into the muscle every 30 (thirty) days.  5  . TESTOSTERONE CYPIONATE IM Inject 0.25 mLs into the muscle every 30 (thirty) days.     No current facility-administered medications on file prior to visit.     Allergies  Allergen Reactions  . Morphine And Related Other (See Comments)    Pt states does not work at relieving pain - states can not tell it works at all     Family History  Problem Relation Age of Onset  . Diabetes Neg Hx     BP 126/80 (BP Location: Left Arm, Patient Position: Sitting, Cuff Size: Normal)   Pulse 87   Wt 273 lb 12.8 oz (124.2 kg)   SpO2 96%   BMI 38.19 kg/m     Review of Systems denies weight loss, blurry vision, headache, sob, n/v, urinary frequency, muscle cramps, memory loss, depression, cold intolerance, and rhinorrhea.  He has lost a few lbs.  He has easy bruising and dry skin.  He is seeing cardiol for chest pain.    Objective:   Physical Exam VS: see vs page GEN: no distress HEAD: head: no deformity eyes: no periorbital swelling, no proptosis external nose and ears are normal mouth: no lesion seen NECK: supple, thyroid is not enlarged.  CHEST WALL: no deformity.  LUNGS: clear to auscultation CV: reg rate and rhythm, no murmur.  ABD: abdomen is soft, nontender.  no hepatosplenomegaly.  not distended.   Self-reducing ventral hernia MUSCULOSKELETAL: muscle bulk and strength are grossly normal.  no obvious joint swelling.  gait is normal and steady EXTEMITIES: no deformity.  no ulcer on the feet.  feet are of normal color and temp.  1+ bilat leg edema,  and bilat vv's.  There is bilateral onychomycosis of the toenails.   PULSES: dorsalis pedis intact bilat.  no carotid bruit NEURO:  cn 2-12 grossly intact.   readily moves all 4's.  sensation is intact to touch on the feet SKIN:  Normal texture and temperature.  No rash or suspicious lesion is visible.  Ecchymoses on both forearms.  NODES:  None palpable at the neck PSYCH: alert, well-oriented.  Does not appear anxious nor depressed.  outside test results are reviewed:  A1c=9.0%  I have reviewed outside records, and summarized: Pt was noted to have elevated a1c, and referred here.  Good medication compliance was noted.  No depression      Assessment & Plan:  Type 2 DM: he needs increased rx Occupational status: he needs to control glucose without insulin.     Patient Instructions  good diet and exercise significantly improve the control of your diabetes.  please let me know if you wish to be referred to a dietician.  high blood sugar is very risky to your health.  you should see an eye doctor and dentist every year.  It is very important to get all recommended vaccinations.  Controlling your blood pressure and cholesterol drastically reduces the damage diabetes does to your body.  Those who smoke should quit.  Please discuss these with your doctor.  check your blood sugar 4 times a day.  vary the time of day when you check, between before the 3 meals, and at bedtime.  also check if you have symptoms of your blood sugar being too high or too low.  please keep a record of the readings and bring it to your next appointment here (or you can bring the meter itself).  You can write it on any piece of paper.  please call us sooner if your blood sugar  goes below 70, or if you have a lot of readings over 200.   Please continue the same janumet. I have sent a prescription to your pharmacy, to add "glimepiride." Please come back for a follow-up appointment in 3 months At our office, we are fortunate to have two specialists who are happy to help you:   Cristy Folks, RN, CDE, is a diabetes educator and pump trainer.  She is here on Monday mornings, and all day Tuesday and Wednesday.  She is can help you with low blood sugar avoidance and treatment, injecting insulin, sick day management, and others.   Oran Rein, RD is our dietician.  She is here all day Thursday and Friday.  She can advise you about a healthy diet.  She can also help you about a variety of special diabetes situations, such as shift work, Animal nutritionist, gluten-free, diet for kidney patients, traveling with diabetes, and help for those who need to gain weight.   Please call or message [place] next week, to tell us how the blood sugar is doing.      Diabetes Mellitus and Nutrition When you have diabetes (diabetes mellitus), it is very important to have healthy eating habits because your blood sugar (glucose) levels are greatly affected by what you eat and drink. Eating healthy foods in the appropriate amounts, at about the same times every day, can help you:  Control your blood glucose.  Lower your risk of heart disease.  Improve your blood pressure.  Reach or maintain a healthy weight.  Every person with diabetes is different, and each person has different needs for a meal plan. Your health  care provider may recommend that you work with a diet and nutrition specialist (dietitian) to make a meal plan that is best for you. Your meal plan may vary depending on factors such as:  The calories you need.  The medicines you take.  Your weight.  Your blood glucose, blood pressure, and cholesterol levels.  Your activity level.  Other health conditions you have, such as heart or  kidney disease.  How do carbohydrates affect me? Carbohydrates affect your blood glucose level more than any other type of food. Eating carbohydrates naturally increases the amount of glucose in your blood. Carbohydrate counting is a method for keeping track of how many carbohydrates you eat. Counting carbohydrates is important to keep your blood glucose at a healthy level, especially if you use insulin or take certain oral diabetes medicines. It is important to know how many carbohydrates you can safely have in each meal. This is different for every person. Your dietitian can help you calculate how many carbohydrates you should have at each meal and for snack. Foods that contain carbohydrates include:  Bread, cereal, rice, pasta, and crackers.  Potatoes and corn.  Peas, beans, and lentils.  Milk and yogurt.  Fruit and juice.  Desserts, such as cakes, cookies, ice cream, and candy.  How does alcohol affect me? Alcohol can cause a sudden decrease in blood glucose (hypoglycemia), especially if you use insulin or take certain oral diabetes medicines. Hypoglycemia can be a life-threatening condition. Symptoms of hypoglycemia (sleepiness, dizziness, and confusion) are similar to symptoms of having too much alcohol. If your health care provider says that alcohol is safe for you, follow these guidelines:  Limit alcohol intake to no more than 1 drink per day for nonpregnant women and 2 drinks per day for men. One drink equals 12 oz of beer, 5 oz of wine, or 1 oz of hard liquor.  Do not drink on an empty stomach.  Keep yourself hydrated with water, diet soda, or unsweetened iced tea.  Keep in mind that regular soda, juice, and other mixers may contain a lot of sugar and must be counted as carbohydrates.  What are tips for following this plan? Reading food labels  Start by checking the serving size on the label. The amount of calories, carbohydrates, fats, and other nutrients listed on the  label are based on one serving of the food. Many foods contain more than one serving per package.  Check the total grams (g) of carbohydrates in one serving. You can calculate the number of servings of carbohydrates in one serving by dividing the total carbohydrates by 15. For example, if a food has 30 g of total carbohydrates, it would be equal to 2 servings of carbohydrates.  Check the number of grams (g) of saturated and trans fats in one serving. Choose foods that have low or no amount of these fats.  Check the number of milligrams (mg) of sodium in one serving. Most people should limit total sodium intake to less than 2,300 mg per day.  Always check the nutrition information of foods labeled as "low-fat" or "nonfat". These foods may be higher in added sugar or refined carbohydrates and should be avoided.  Talk to your dietitian to identify your daily goals for nutrients listed on the label. Shopping  Avoid buying canned, premade, or processed foods. These foods tend to be high in fat, sodium, and added sugar.  Shop around the outside edge of the grocery store. This includes fresh fruits and vegetables,  bulk grains, fresh meats, and fresh dairy. Cooking  Use low-heat cooking methods, such as baking, instead of high-heat cooking methods like deep frying.  Cook using healthy oils, such as olive, canola, or sunflower oil.  Avoid cooking with butter, cream, or high-fat meats. Meal planning  Eat meals and snacks regularly, preferably at the same times every day. Avoid going long periods of time without eating.  Eat foods high in fiber, such as fresh fruits, vegetables, beans, and whole grains. Talk to your dietitian about how many servings of carbohydrates you can eat at each meal.  Eat 4-6 ounces of lean protein each day, such as lean meat, chicken, fish, eggs, or tofu. 1 ounce is equal to 1 ounce of meat, chicken, or fish, 1 egg, or 1/4 cup of tofu.  Eat some foods each day that  contain healthy fats, such as avocado, nuts, seeds, and fish. Lifestyle   Check your blood glucose regularly.  Exercise at least 30 minutes 5 or more days each week, or as told by your health care provider.  Take medicines as told by your health care provider.  Do not use any products that contain nicotine or tobacco, such as cigarettes and e-cigarettes. If you need help quitting, ask your health care provider.  Work with a Veterinary surgeon or diabetes educator to identify strategies to manage stress and any emotional and social challenges. What are some questions to ask my health care provider?  Do I need to meet with a diabetes educator?  Do I need to meet with a dietitian?  What number can I call if I have questions?  When are the best times to check my blood glucose? Where to find more information:  American Diabetes Association: diabetes.org/food-and-fitness/food  Academy of Nutrition and Dietetics: https://www.vargas.com/  [employer] of Diabetes and Digestive and Kidney Diseases (NIH): FindJewelers.cz Summary  A healthy meal plan will help you control your blood glucose and maintain a healthy lifestyle.  Working with a diet and nutrition specialist (dietitian) can help you make a meal plan that is best for you.  Keep in mind that carbohydrates and alcohol have immediate effects on your blood glucose levels. It is important to count carbohydrates and to use alcohol carefully. This information is not intended to replace advice given to you by your health care provider. Make sure you discuss any questions you have with your health care provider. Document Released: 06/28/2005 Document Revised: 11/05/2016 Document Reviewed: 11/05/2016 Elsevier Interactive Patient Education  [employer].

## 2018-01-22 NOTE — Patient Instructions (Addendum)
good diet and exercise significantly improve the control of your diabetes.  please let me know if you wish to be referred to a dietician.  high blood sugar is very risky to your health.  you should see an eye doctor and dentist every year.  It is very important to get all recommended vaccinations.  Controlling your blood pressure and cholesterol drastically reduces the damage diabetes does to your body.  Those who smoke should quit.  Please discuss these with your doctor.  check your blood sugar 4 times a day.  vary the time of day when you check, between before the 3 meals, and at bedtime.  also check if you have symptoms of your blood sugar being too high or too low.  please keep a record of the readings and bring it to your next appointment here (or you can bring the meter itself).  You can write it on any piece of paper.  please call us sooner if your blood sugar goes below 70, or if you have a lot of readings over 200.   Please continue the same janumet. I have sent a prescription to your pharmacy, to add "glimepiride." Please come back for a follow-up appointment in 3 months At our office, we are fortunate to have two specialists who are happy to help you:   Cristy Folks, RN, CDE, is a diabetes educator and pump trainer.  She is here on Monday mornings, and all day Tuesday and Wednesday.  She is can help you with low blood sugar avoidance and treatment, injecting insulin, sick day management, and others.   Oran Rein, RD is our dietician.  She is here all day Thursday and Friday.  She can advise you about a healthy diet.  She can also help you about a variety of special diabetes situations, such as shift work, Animal nutritionist, gluten-free, diet for kidney patients, traveling with diabetes, and help for those who need to gain weight.   Please call or message [place] next week, to tell us how the blood sugar is doing.      Diabetes Mellitus and Nutrition When you have diabetes (diabetes mellitus), it  is very important to have healthy eating habits because your blood sugar (glucose) levels are greatly affected by what you eat and drink. Eating healthy foods in the appropriate amounts, at about the same times every day, can help you:  Control your blood glucose.  Lower your risk of heart disease.  Improve your blood pressure.  Reach or maintain a healthy weight.  Every person with diabetes is different, and each person has different needs for a meal plan. Your health care provider may recommend that you work with a diet and nutrition specialist (dietitian) to make a meal plan that is best for you. Your meal plan may vary depending on factors such as:  The calories you need.  The medicines you take.  Your weight.  Your blood glucose, blood pressure, and cholesterol levels.  Your activity level.  Other health conditions you have, such as heart or kidney disease.  How do carbohydrates affect me? Carbohydrates affect your blood glucose level more than any other type of food. Eating carbohydrates naturally increases the amount of glucose in your blood. Carbohydrate counting is a method for keeping track of how many carbohydrates you eat. Counting carbohydrates is important to keep your blood glucose at a healthy level, especially if you use insulin or take certain oral diabetes medicines. It is important to know how many  carbohydrates you can safely have in each meal. This is different for every person. Your dietitian can help you calculate how many carbohydrates you should have at each meal and for snack. Foods that contain carbohydrates include:  Bread, cereal, rice, pasta, and crackers.  Potatoes and corn.  Peas, beans, and lentils.  Milk and yogurt.  Fruit and juice.  Desserts, such as cakes, cookies, ice cream, and candy.  How does alcohol affect me? Alcohol can cause a sudden decrease in blood glucose (hypoglycemia), especially if you use insulin or take certain oral  diabetes medicines. Hypoglycemia can be a life-threatening condition. Symptoms of hypoglycemia (sleepiness, dizziness, and confusion) are similar to symptoms of having too much alcohol. If your health care provider says that alcohol is safe for you, follow these guidelines:  Limit alcohol intake to no more than 1 drink per day for nonpregnant women and 2 drinks per day for men. One drink equals 12 oz of beer, 5 oz of wine, or 1 oz of hard liquor.  Do not drink on an empty stomach.  Keep yourself hydrated with water, diet soda, or unsweetened iced tea.  Keep in mind that regular soda, juice, and other mixers may contain a lot of sugar and must be counted as carbohydrates.  What are tips for following this plan? Reading food labels  Start by checking the serving size on the label. The amount of calories, carbohydrates, fats, and other nutrients listed on the label are based on one serving of the food. Many foods contain more than one serving per package.  Check the total grams (g) of carbohydrates in one serving. You can calculate the number of servings of carbohydrates in one serving by dividing the total carbohydrates by 15. For example, if a food has 30 g of total carbohydrates, it would be equal to 2 servings of carbohydrates.  Check the number of grams (g) of saturated and trans fats in one serving. Choose foods that have low or no amount of these fats.  Check the number of milligrams (mg) of sodium in one serving. Most people should limit total sodium intake to less than 2,300 mg per day.  Always check the nutrition information of foods labeled as "low-fat" or "nonfat". These foods may be higher in added sugar or refined carbohydrates and should be avoided.  Talk to your dietitian to identify your daily goals for nutrients listed on the label. Shopping  Avoid buying canned, premade, or processed foods. These foods tend to be high in fat, sodium, and added sugar.  Shop around the  outside edge of the grocery store. This includes fresh fruits and vegetables, bulk grains, fresh meats, and fresh dairy. Cooking  Use low-heat cooking methods, such as baking, instead of high-heat cooking methods like deep frying.  Cook using healthy oils, such as olive, canola, or sunflower oil.  Avoid cooking with butter, cream, or high-fat meats. Meal planning  Eat meals and snacks regularly, preferably at the same times every day. Avoid going long periods of time without eating.  Eat foods high in fiber, such as fresh fruits, vegetables, beans, and whole grains. Talk to your dietitian about how many servings of carbohydrates you can eat at each meal.  Eat 4-6 ounces of lean protein each day, such as lean meat, chicken, fish, eggs, or tofu. 1 ounce is equal to 1 ounce of meat, chicken, or fish, 1 egg, or 1/4 cup of tofu.  Eat some foods each day that contain healthy fats, such  as avocado, nuts, seeds, and fish. Lifestyle   Check your blood glucose regularly.  Exercise at least 30 minutes 5 or more days each week, or as told by your health care provider.  Take medicines as told by your health care provider.  Do not use any products that contain nicotine or tobacco, such as cigarettes and e-cigarettes. If you need help quitting, ask your health care provider.  Work with a Veterinary surgeoncounselor or diabetes educator to identify strategies to manage stress and any emotional and social challenges. What are some questions to ask my health care provider?  Do I need to meet with a diabetes educator?  Do I need to meet with a dietitian?  What number can I call if I have questions?  When are the best times to check my blood glucose? Where to find more information:  American Diabetes Association: diabetes.org/food-and-fitness/food  Academy of Nutrition and Dietetics: https://www.vargas.com/www.eatright.org/resources/health/diseases-and-conditions/diabetes  [employer] of Diabetes and Digestive and Kidney  Diseases (NIH): FindJewelers.czwww.niddk.nih.gov/health-information/diabetes/overview/diet-eating-physical-activity Summary  A healthy meal plan will help you control your blood glucose and maintain a healthy lifestyle.  Working with a diet and nutrition specialist (dietitian) can help you make a meal plan that is best for you.  Keep in mind that carbohydrates and alcohol have immediate effects on your blood glucose levels. It is important to count carbohydrates and to use alcohol carefully. This information is not intended to replace advice given to you by your health care provider. Make sure you discuss any questions you have with your health care provider. Document Released: 06/28/2005 Document Revised: 11/05/2016 Document Reviewed: 11/05/2016 Elsevier Interactive Patient Education  [employer]2018 [employer]

## 2018-01-23 ENCOUNTER — Telehealth: Payer: Self-pay

## 2018-01-23 NOTE — Telephone Encounter (Signed)
Notes sent to scheduling from Dr. Brent BullaLawrence [place] office.

## 2018-01-24 DIAGNOSIS — E119 Type 2 diabetes mellitus without complications: Secondary | ICD-10-CM | POA: Insufficient documentation

## 2018-01-30 ENCOUNTER — Telehealth: Payer: Self-pay | Admitting: Endocrinology

## 2018-01-30 NOTE — Telephone Encounter (Signed)
I LVM for patient to call back to verify medication.

## 2018-01-30 NOTE — Telephone Encounter (Signed)
Dr Everardo AllEllison told pt to call re; Sugar level-today = 151, yesterday = 158, prior = 150

## 2018-01-30 NOTE — Telephone Encounter (Signed)
Patient some one called him asking what medication he was taking, he stated he was taking glimepiride (AMARYL) 4 MG tablet [[id number]][[id number]] and JANUMET.

## 2018-01-30 NOTE — Telephone Encounter (Signed)
please call patient: You are taking glimpeiride.  Please verify: are you also taking janumet?

## 2018-01-30 NOTE — Telephone Encounter (Signed)
Please advise 

## 2018-02-03 MED ORDER — BROMOCRIPTINE MESYLATE 2.5 MG PO TABS: ORAL_TABLET | ORAL | 11 refills | Status: DC

## 2018-02-03 NOTE — Telephone Encounter (Signed)
I called patient & he stated that he was taking both glimiepride & janumet.

## 2018-02-03 NOTE — Telephone Encounter (Signed)
I have sent a prescription to your pharmacy, to add "bromocriptine." to get this as generic, you would take 1/4 of  2 mg qd. I'll see you next time.

## 2018-02-04 ENCOUNTER — Other Ambulatory Visit: Payer: Self-pay

## 2018-02-04 NOTE — Telephone Encounter (Signed)
Yes, he should take also.

## 2018-02-04 NOTE — Telephone Encounter (Signed)
Is patient supposed to be on janumet as well? He said he didn't start talking it until the day after his visit so it's not listed. I informed him about the prescription of bromocriptine.

## 2018-02-04 NOTE — Telephone Encounter (Signed)
I spoke with patient & informed him that he should be taking janumet as well.

## 2018-02-10 ENCOUNTER — Ambulatory Visit: Payer: 59 | Admitting: Endocrinology

## 2018-02-10 ENCOUNTER — Telehealth: Payer: Self-pay | Admitting: Emergency Medicine

## 2018-02-10 NOTE — Telephone Encounter (Signed)
Pt called and stated he stopped taking the medication bromocriptine (PARLODEL) 2.5 MG tablet over the weekend. It was giving him digestive problems that he noticed since he started this medication. Please advise thanks.

## 2018-02-10 NOTE — Telephone Encounter (Signed)
I called & let patient no to d/c bromocriptine, but to continue same medications.

## 2018-02-10 NOTE — Telephone Encounter (Signed)
D/c bromocriptine. Please continue the same other medications. I'll see you next time.

## 2018-02-18 ENCOUNTER — Encounter: Payer: Self-pay | Admitting: Cardiovascular Disease

## 2018-02-18 ENCOUNTER — Ambulatory Visit (INDEPENDENT_AMBULATORY_CARE_PROVIDER_SITE_OTHER): Payer: 59 | Admitting: Cardiovascular Disease

## 2018-02-18 DIAGNOSIS — E7800 Pure hypercholesterolemia, unspecified: Secondary | ICD-10-CM

## 2018-02-18 DIAGNOSIS — M549 Dorsalgia, unspecified: Secondary | ICD-10-CM | POA: Insufficient documentation

## 2018-02-18 DIAGNOSIS — I10 Essential (primary) hypertension: Secondary | ICD-10-CM | POA: Insufficient documentation

## 2018-02-18 DIAGNOSIS — E782 Mixed hyperlipidemia: Secondary | ICD-10-CM | POA: Insufficient documentation

## 2018-02-18 DIAGNOSIS — E785 Hyperlipidemia, unspecified: Secondary | ICD-10-CM | POA: Insufficient documentation

## 2018-02-18 NOTE — Assessment & Plan Note (Signed)
History of essential hypertension her blood pressure measured at 116/69.  He is on lisinopril.  Continue current meds at current dosing.

## 2018-02-18 NOTE — Assessment & Plan Note (Signed)
History of hyperlipidemia on statin therapy followed by his PCP 

## 2018-02-18 NOTE — Progress Notes (Signed)
02/18/2018 EROS MONTOUR   November 01, 1955  [id number]  Primary Physician Abigail Miyamoto, MD Primary Cardiologist: Runell Gess MD Nicholes Calamity, [place]  HPI:  Brent Duarte is a 62 y.o. 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  He was referred by Dr. Brent Bulla and aspirin for cardiovascular evaluation because of atypical back pain and an abnormal EKG.  His risk factors include treated hypertension, diabetes and hyperlipidemia.  He has never had a heart attack or stroke.  He denies chest pain.  He developed some back pain at the end of March which was somewhat positional.  Occurs mostly when he is lying in bed at night especially on one side and goes away spontaneously after several minutes when he changes positions.  There are no other associated symptoms.  He did have a chest CT on 01/17/2018 that showed some mild calcification in the left coronary system.  His EKG provided by his PCP showed no acute changes.  He says the pain actually is improving over time.   Current Meds  Medication Sig  . allopurinol (ZYLOPRIM) 300 MG tablet Take 300 mg by mouth at bedtime.  Marland Kitchen atorvastatin (LIPITOR) 40 MG tablet Take 40 mg by mouth daily.  . clobetasol ointment (TEMOVATE) 0.05 % Apply 1 application topically 3 (three) times daily.  Marland Kitchen docusate sodium (COLACE) 100 MG capsule Take 1 capsule (100 mg total) by mouth 2 (two) times daily as needed for mild constipation.  . gabapentin (NEURONTIN) 300 MG capsule Take 300 mg by mouth 3 (three) times daily.  Marland Kitchen glimepiride (AMARYL) 4 MG tablet Take 1 tablet (4 mg total) by mouth daily with breakfast.  . lisinopril (PRINIVIL,ZESTRIL) 5 MG tablet Take 5 mg by mouth at bedtime.   . methocarbamol (ROBAXIN) 500 MG tablet Take 1 tablet (500 mg total) by mouth every 8 (eight) hours as needed for muscle spasms.  Marland Kitchen oxyCODONE-acetaminophen (PERCOCET) 10-325 MG tablet Take 1 tablet by mouth every 4 (four)  hours as needed for pain.  Marland Kitchen oxyCODONE-acetaminophen (PERCOCET/ROXICET) 5-325 MG tablet Take 5-325 tablets by mouth every 6 (six) hours as needed.  Marland Kitchen PRESCRIPTION MEDICATION Apply 1 application topically 2 (two) times daily. Calcitriol ointment 51mcg/g. [facility] [place].  Marland Kitchen testosterone cypionate (DEPOTESTOSTERONE CYPIONATE) 200 MG/ML injection Inject 200 mg into the muscle every 30 (thirty) days.  Marland Kitchen testosterone cypionate (DEPOTESTOSTERONE CYPIONATE) 200 MG/ML injection Inject 1 mL into the muscle every 30 (thirty) days.   . [DISCONTINUED] bromocriptine (PARLODEL) 2.5 MG tablet 1/4 tab daily  . [DISCONTINUED] Dupilumab, Asthma, (DUPIXENT [place]) Take 300 mg by mouth once a week. Every other week  . [DISCONTINUED] guaiFENesin (MUCINEX) 600 MG 12 hr tablet Take 1,200 mg by mouth 2 (two) times daily as needed for cough.  . [DISCONTINUED] pravastatin (PRAVACHOL) 40 MG tablet Take 40 mg by mouth at bedtime.      Allergies  Allergen Reactions  . Morphine And Related Other (See Comments)    Pt states does not work at relieving pain - states can not tell it works at all     Social History   Socioeconomic History  . Marital status: Married    Spouse name: Not on file  . Number of children: Not on file  . Years of education: Not on file  . Highest education level: Not on file  Occupational History  . Not on file  Social Needs  . Physicist, medical  strain: Not on file  . Food insecurity:    Worry: Not on file    Inability: Not on file  . Transportation needs:    Medical: Not on file    Non-medical: Not on file  Tobacco Use  . Smoking status: Never Smoker  . Smokeless tobacco: Never Used  Substance and Sexual Activity  . Alcohol use: No  . Drug use: No  . Sexual activity: Not on file  Lifestyle  . Physical activity:    Days per week: Not on file    Minutes per session: Not on file  . Stress: Not on file  Relationships  . Social connections:    Talks on phone: Not on file      Gets together: Not on file    Attends religious service: Not on file    Active member of club or organization: Not on file    Attends meetings of clubs or organizations: Not on file    Relationship status: Not on file  . Intimate partner violence:    Fear of current or ex partner: Not on file    Emotionally abused: Not on file    Physically abused: Not on file    Forced sexual activity: Not on file  Other Topics Concern  . Not on file  Social History Narrative  . Not on file     Review of Systems: General: negative for chills, fever, night sweats or weight changes.  Cardiovascular: negative for chest pain, dyspnea on exertion, edema, orthopnea, palpitations, paroxysmal nocturnal dyspnea or shortness of breath Dermatological: negative for rash Respiratory: negative for cough or wheezing Urologic: negative for hematuria Abdominal: negative for nausea, vomiting, diarrhea, bright red blood per rectum, melena, or hematemesis Neurologic: negative for visual changes, syncope, or dizziness All other systems reviewed and are otherwise negative except as noted above.    Blood pressure 116/69, pulse 73, height  (1.803 m), weight 276 lb (125.2 kg).  General appearance: alert and no distress Neck: no adenopathy, no carotid bruit, no JVD, supple, symmetrical, trachea midline and thyroid not enlarged, symmetric, no tenderness/mass/nodules Lungs: clear to auscultation bilaterally Heart: regular rate and rhythm, S1, S2 normal, no murmur, click, rub or gallop Extremities: extremities normal, atraumatic, no cyanosis or edema Pulses: 2+ and symmetric Skin: Skin color, texture, turgor normal. No rashes or lesions Neurologic: Alert and oriented X 3, normal strength and tone. Normal symmetric reflexes. Normal coordination and gait  EKG not performed today  ASSESSMENT AND PLAN:   Essential hypertension History of essential hypertension her blood pressure measured at 116/69.  He is on  lisinopril.  Continue current meds at current dosing.  Hyperlipidemia History of hyperlipidemia on statin therapy followed by his PCP  Back pain Brent Duarte was referred to me by Dr. Brent Bulla and Astepro for evaluation of atypical back pain radiating to his lower chest.  He does have risk factors including diabetes, hypertension and hyperlipidemia.  Never had a heart attack or stroke.  He did have a chest CT on 01/17/2018 that showed some mild left coronary system calcification.  The pain occurs but split night when he is lying down.  It is positional and it goes away when he changes positions.  Is not related to activity.  I do not think it is cardiovascular nature.  His EKG shows no acute changes.  I will see him back in 3 months for follow-up.  At this point, I do not feel compelled to order any functional test.  Runell Gess MD FACP,FACC,FAHA, [facility] 02/18/2018 8:37 AM

## 2018-02-18 NOTE — Patient Instructions (Signed)
Medication Instructions: Your physician recommends that you continue on your current medications as directed. Please refer to the Current Medication list given to you today.  Follow-Up: Your physician recommends that you schedule a follow-up appointment in: 3-4 months with Dr. Allyson Sabal.

## 2018-02-18 NOTE — Assessment & Plan Note (Signed)
Mr. Artman was referred to me by Dr. Brent Bulla and Astepro for evaluation of atypical back pain radiating to his lower chest.  He does have risk factors including diabetes, hypertension and hyperlipidemia.  Never had a heart attack or stroke.  He did have a chest CT on 01/17/2018 that showed some mild left coronary system calcification.  The pain occurs but split night when he is lying down.  It is positional and it goes away when he changes positions.  Is not related to activity.  I do not think it is cardiovascular nature.  His EKG shows no acute changes.  I will see him back in 3 months for follow-up.  At this point, I do not feel compelled to order any functional test.

## 2018-04-30 ENCOUNTER — Ambulatory Visit (INDEPENDENT_AMBULATORY_CARE_PROVIDER_SITE_OTHER): Payer: 59 | Admitting: Endocrinology

## 2018-04-30 ENCOUNTER — Encounter: Payer: Self-pay | Admitting: Endocrinology

## 2018-04-30 VITALS — BP 112/70 | HR 76 | Ht 71.0 in | Wt 278.0 lb

## 2018-04-30 DIAGNOSIS — E119 Type 2 diabetes mellitus without complications: Secondary | ICD-10-CM | POA: Diagnosis not present

## 2018-04-30 MED ORDER — EMPAGLIFLOZIN 10 MG PO TABS: 10.0000 mg | ORAL_TABLET | Freq: Every day | ORAL | 3 refills | Status: DC

## 2018-04-30 MED ORDER — GLIMEPIRIDE 1 MG PO TABS: 1.0000 mg | ORAL_TABLET | Freq: Every day | ORAL | 3 refills | Status: DC

## 2018-04-30 MED ORDER — EMPAGLIFLOZIN 10 MG PO TABS: 5.0000 mg | ORAL_TABLET | Freq: Every day | ORAL | 3 refills | Status: DC

## 2018-04-30 NOTE — Patient Instructions (Addendum)
Please continue the same janumet I have sent 2 prescriptions to your pharmacy: to reduce the glimepiride, and to add jardiance at a lower dosage. check your blood sugar once a day.  vary the time of day when you check, between before the 3 meals, and at bedtime.  also check if you have symptoms of your blood sugar being too high or too low.  please keep a record of the readings and bring it to your next appointment here (or you can bring the meter itself).  You can write it on any piece of paper.  please call us sooner if your blood sugar goes below 70, or if you have a lot of readings over 200.  Please come back for a follow-up appointment in 3 months

## 2018-04-30 NOTE — Progress Notes (Signed)
Subjective:    Patient ID: Brent Duarte, male    DOB: 1955-11-07, 62 y.o.   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  He takes meds as rx'ed.  Past Medical History:  Diagnosis Date  . Arthritis   . Diabetes mellitus without complication (HCC)   . History of chicken pox   . History of kidney stones   . History of measles   . History of mumps   . Hyperlipidemia   . Hypertension   . Imbalance    secondary to back issues  . Multiple gastric ulcers   . Numbness and tingling of both legs   . Pneumonia   . Recurrent falls     Past Surgical History:  Procedure Laterality Date  . bicep tear     right lower arm related to overgrowth of bone as stated per pt   . KNEE ARTHROSCOPY     bilat  . lithrotripsy     for kidney stones  . LUMBAR LAMINECTOMY/DECOMPRESSION MICRODISCECTOMY N/A 09/07/2015   Procedure: MICRO LUMBAR DECOMPRESSION L4 - L5, REMOVAL OF SYNOVIAL CYST L4-L5;  Surgeon: Jene Every, MD;  Location: [facility];  Service: Orthopedics;  Laterality: N/A;  . URETERAL STENT PLACEMENT     secondary to kidney stones/pt states had laser surgery prior     Social History   Socioeconomic History  . Marital status: Married    Spouse name: Not on file  . Number of children: Not on file  . Years of education: Not on file  . Highest education level: Not on file  Occupational History  . Not on file  Social Needs  . Financial resource strain: Not on file  . Food insecurity:    Worry: Not on file    Inability: Not on file  . Transportation needs:    Medical: Not on file    Non-medical: Not on file  Tobacco Use   . Smoking status: Never Smoker  . Smokeless tobacco: Never Used  Substance and Sexual Activity  . Alcohol use: No  . Drug use: No  . Sexual activity: Not on file  Lifestyle  . Physical activity:    Days per week: Not on file    Minutes per session: Not on file  . Stress: Not on file  Relationships  . Social connections:    Talks on phone: Not on file    Gets together: Not on file    Attends religious service: Not on file    Active member of club or organization: Not on file    Attends meetings of clubs or organizations: Not on file    Relationship status: Not on file  . Intimate partner violence:    Fear of current or ex partner: Not on file    Emotionally abused: Not on file    Physically abused: Not on file    Forced sexual activity: Not on file  Other Topics Concern  . Not on file  Social History Narrative  . Not on file    Current Outpatient Medications on File Prior to Visit  Medication Sig Dispense Refill  . allopurinol (ZYLOPRIM) 300 MG tablet Take 300  mg by mouth at bedtime.    Marland Kitchen. atorvastatin (LIPITOR) 40 MG tablet Take 40 mg by mouth daily.  3  . clobetasol ointment (TEMOVATE) 0.05 % Apply 1 application topically 3 (three) times daily.    Marland Kitchen. docusate sodium (COLACE) 100 MG capsule Take 1 capsule (100 mg total) by mouth 2 (two) times daily as needed for mild constipation. 20 capsule 1  . gabapentin (NEURONTIN) 300 MG capsule Take 300 mg by mouth 3 (three) times daily.    Marland Kitchen. lisinopril (PRINIVIL,ZESTRIL) 5 MG tablet Take 5 mg by mouth at bedtime.     . methocarbamol (ROBAXIN) 500 MG tablet Take 1 tablet (500 mg total) by mouth every 8 (eight) hours as needed for muscle spasms. 60 tablet 1  . oxyCODONE-acetaminophen (PERCOCET) 10-325 MG tablet Take 1 tablet by mouth every 4 (four) hours as needed for pain. 60 tablet 0  . PRESCRIPTION MEDICATION Apply 1 application topically 2 (two) times daily. Calcitriol ointment 973mcg/g. [facility] [place].    .  sitaGLIPtin-metformin (JANUMET) 50-500 MG tablet Take 1 tablet by mouth 2 (two) times daily with a meal.    . testosterone cypionate (DEPOTESTOSTERONE CYPIONATE) 200 MG/ML injection Inject 200 mg into the muscle every 30 (thirty) days.  5   No current facility-administered medications on file prior to visit.     Allergies  Allergen Reactions  . Morphine And Related Other (See Comments)    Pt states does not work at relieving pain - states can not tell it works at all     Family History  Problem Relation Age of Onset  . Lung cancer Mother   . Lung cancer Father   . Diabetes Neg Hx     BP 112/70 (BP Location: Left Arm, Patient Position: Sitting, Cuff Size: Large)   Pulse 76   Ht 5\' 11"  (1.803 m)   Wt 278 lb (126.1 kg)   SpO2 96%   BMI 38.77 kg/m   Review of Systems He denies hypoglycemia.     Objective:   Physical Exam VITAL SIGNS:  See vs page GENERAL: no distress Pulses: dorsalis pedis intact bilat.   MSK: no deformity of the feet CV: trace bilat leg edema, and bilat vv's Skin:  no ulcer on the feet.  normal color and temp on the feet. Neuro: sensation is intact to touch on the feet Ext: There is bilateral onychomycosis of the toenails  Pt says a1c was recently 7.0, by Dr Marina GoodellPerry.      Assessment & Plan:  Type 2 DM: well-controlled, but another goal is to minimize amaryl.   Edema: this limits rx options Occupational status: he needs to control DM without insulin.  Patient Instructions  Please continue the same janumet I have sent 2 prescriptions to your pharmacy: to reduce the glimepiride, and to add jardiance at a lower dosage. check your blood sugar once a day.  vary the time of day when you check, between before the 3 meals, and at bedtime.  also check if you have symptoms of your blood sugar being too high or too low.  please keep a record of the readings and bring it to your next appointment here (or you can bring the meter itself).  You can write it on any  piece of paper.  please call us sooner if your blood sugar goes below 70, or if you have a lot of readings over 200.  Please come back for a follow-up appointment in 3 months

## 2018-06-04 ENCOUNTER — Encounter: Payer: Self-pay | Admitting: Cardiovascular Disease

## 2018-06-04 ENCOUNTER — Ambulatory Visit: Payer: 59 | Admitting: Cardiovascular Disease

## 2018-06-04 VITALS — BP 122/68 | HR 72 | Ht 71.0 in | Wt 282.0 lb

## 2018-06-04 DIAGNOSIS — I10 Essential (primary) hypertension: Secondary | ICD-10-CM

## 2018-06-04 DIAGNOSIS — E7800 Pure hypercholesterolemia, unspecified: Secondary | ICD-10-CM

## 2018-06-04 NOTE — Patient Instructions (Signed)
Your physician recommends that you schedule a follow-up appointment in: AS NEEDED  

## 2018-06-04 NOTE — Assessment & Plan Note (Signed)
History of essential hypertension blood pressure measured at 122/68.  He is on lisinopril.  Continue current meds at current dosing

## 2018-06-04 NOTE — Assessment & Plan Note (Addendum)
History of hyperlipidemia on statin therapy with recent lipid profile performed 03/07/2018 revealing total cholesterol 115, LDL 42 and HDL of 27.

## 2018-06-04 NOTE — Progress Notes (Signed)
06/04/2018 Brent Duarte   01/21/1956  [id number]  Primary Physician Brent Duarte, Brent Edward, MD Primary Cardiologist: Runell GessJonathan J Jaslynn Thome MD Brent CalamityFACP, FACC, FAHA, Brent Duarte  HPI:  Brent Duarte is a 62 y.o.  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  He was referred by Dr. Brent BullaLawrence Duarte  for cardiovascular evaluation because of atypical back pain and an abnormal EKG. I last saw him in the office 02/18/2018. His risk factors include treated hypertension, diabetes and hyperlipidemia.  He has never had a heart attack or stroke.  He denies chest pain.  He developed some back pain at the end of March which was somewhat positional.  Occurs mostly when he is lying in bed at night especially on one side and goes away spontaneously after several minutes when he changes positions.  There are no other associated symptoms.  He did have a chest CT on 01/17/2018 that showed some mild calcification in the left coronary system.  His EKG provided by his PCP showed no acute changes.  He says the pain actually is improving over time.  Since I saw him 3 months ago his back pain has completely resolved.  He denies chest pain.  Blood pressures under good control and he has an excellent lipid profile. Current Meds  Medication Sig  . allopurinol (ZYLOPRIM) 300 MG tablet Take 300 mg by mouth at bedtime.  Marland Kitchen. atorvastatin (LIPITOR) 40 MG tablet Take 40 mg by mouth daily.  . clobetasol ointment (TEMOVATE) 0.05 % Apply 1 application topically 3 (three) times daily.  Marland Kitchen. docusate sodium (COLACE) 100 MG capsule Take 1 capsule (100 mg total) by mouth 2 (two) times daily as needed for mild constipation.  . empagliflozin (JARDIANCE) 10 MG TABS tablet Take 5 mg by mouth daily.  Marland Kitchen. gabapentin (NEURONTIN) 300 MG capsule Take 300 mg by mouth 3 (three) times daily.  Marland Kitchen. glimepiride (AMARYL) 1 MG tablet Take 1 tablet (1 mg total) by mouth daily with breakfast.  . lisinopril (PRINIVIL,ZESTRIL)  5 MG tablet Take 5 mg by mouth at bedtime.   . methocarbamol (ROBAXIN) 500 MG tablet Take 1 tablet (500 mg total) by mouth every 8 (eight) hours as needed for muscle spasms.  Marland Kitchen. oxyCODONE-acetaminophen (PERCOCET) 10-325 MG tablet Take 1 tablet by mouth every 4 (four) hours as needed for pain.  Marland Kitchen. PRESCRIPTION MEDICATION Apply 1 application topically 2 (two) times daily. Calcitriol ointment 293mcg/g. [facility] [place].  . sitaGLIPtin-metformin (JANUMET) 50-500 MG tablet Take 1 tablet by mouth 2 (two) times daily with a meal.  . testosterone cypionate (DEPOTESTOSTERONE CYPIONATE) 200 MG/ML injection Inject 200 mg into the muscle every 30 (thirty) days.     Allergies  Allergen Reactions  . Morphine And Related Other (See Comments)    Pt states does not work at relieving pain - states can not tell it works at all     Social History   Socioeconomic History  . Marital status: Married    Spouse name: Not on file  . Number of children: Not on file  . Years of education: Not on file  . Highest education level: Not on file  Occupational History  . Not on file  Social Needs  . Financial resource strain: Not on file  . Food insecurity:    Worry: Not on file    Inability: Not on file  . Transportation needs:    Medical: Not on file  Non-medical: Not on file  Tobacco Use  . Smoking status: Never Smoker  . Smokeless tobacco: Never Used  Substance and Sexual Activity  . Alcohol use: No  . Drug use: No  . Sexual activity: Not on file  Lifestyle  . Physical activity:    Days per week: Not on file    Minutes per session: Not on file  . Stress: Not on file  Relationships  . Social connections:    Talks on phone: Not on file    Gets together: Not on file    Attends religious service: Not on file    Active member of club or organization: Not on file    Attends meetings of clubs or organizations: Not on file    Relationship status: Not on file  . Intimate partner violence:     Fear of current or ex partner: Not on file    Emotionally abused: Not on file    Physically abused: Not on file    Forced sexual activity: Not on file  Other Topics Concern  . Not on file  Social History Narrative  . Not on file     Review of Systems: General: negative for chills, fever, night sweats or weight changes.  Cardiovascular: negative for chest pain, dyspnea on exertion, edema, orthopnea, palpitations, paroxysmal nocturnal dyspnea or shortness of breath Dermatological: negative for rash Respiratory: negative for cough or wheezing Urologic: negative for hematuria Abdominal: negative for nausea, vomiting, diarrhea, bright red blood per rectum, melena, or hematemesis Neurologic: negative for visual changes, syncope, or dizziness All other systems reviewed and are otherwise negative except as noted above.    Blood pressure 122/68, pulse 72, height 5\' 11"  (1.803 m), weight 282 lb (127.9 kg).  General appearance: alert and no distress Neck: no adenopathy, no carotid bruit, no JVD, supple, symmetrical, trachea midline and thyroid not enlarged, symmetric, no tenderness/mass/nodules Lungs: clear to auscultation bilaterally Heart: regular rate and rhythm, S1, S2 normal, no murmur, click, rub or gallop Extremities: extremities normal, atraumatic, no cyanosis or edema Pulses: 2+ and symmetric Skin: Skin color, texture, turgor normal. No rashes or lesions Neurologic: Alert and oriented X 3, normal strength and tone. Normal symmetric reflexes. Normal coordination and gait  EKG sinus rhythm at 72 without ST or T wave changes.  I personally reviewed this EKG.  ASSESSMENT AND PLAN:   Back pain Brent Duarte's atypical symptoms have completely resolved as of 1 month ago.  Essential hypertension History of essential hypertension blood pressure measured at 122/68.  He is on lisinopril.  Continue current meds at current dosing  Hyperlipidemia History of hyperlipidemia on statin therapy  with recent lipid profile performed 03/07/2018 revealing total cholesterol 115, LDL 42 and HDL of 27.      Runell GessJonathan J. Rage Beever MD [facility] HospitalFACP,FACC,FAHA, [facility] 06/04/2018 9:58 AM

## 2018-06-04 NOTE — Assessment & Plan Note (Signed)
Mr. Brent Duarte's atypical symptoms have completely resolved as of 1 month ago.

## 2018-07-31 ENCOUNTER — Encounter: Payer: Self-pay | Admitting: Endocrinology

## 2018-07-31 ENCOUNTER — Ambulatory Visit: Payer: 59 | Admitting: Endocrinology

## 2018-07-31 VITALS — BP 124/70 | HR 80 | Ht 71.0 in | Wt 282.2 lb

## 2018-07-31 DIAGNOSIS — E1122 Type 2 diabetes mellitus with diabetic chronic kidney disease: Secondary | ICD-10-CM | POA: Diagnosis not present

## 2018-07-31 DIAGNOSIS — N183 Chronic kidney disease, stage 3 (moderate): Secondary | ICD-10-CM

## 2018-07-31 MED ORDER — GLIMEPIRIDE 1 MG PO TABS
0.5000 mg | ORAL_TABLET | Freq: Every day | ORAL | 3 refills | Status: DC
Start: 2018-07-31 — End: 2018-11-04

## 2018-07-31 NOTE — Patient Instructions (Addendum)
Please reduce the glimepiride to 1/2 pill each morning, and: Please continue the same other diabetes medications I have sent 2 prescriptions to your pharmacy: to reduce the glimepiride, and to add jardiance at a lower dosage. check your blood sugar once a day.  vary the time of day when you check, between before the 3 meals, and at bedtime.  also check if you have symptoms of your blood sugar being too high or too low.  please keep a record of the readings and bring it to your next appointment here (or you can bring the meter itself).  You can write it on any piece of paper.  please call us sooner if your blood sugar goes below 70, or if you have a lot of readings over 200.  Please come back for a follow-up appointment in 3 months.

## 2018-07-31 NOTE — Progress Notes (Signed)
Subjective:    Patient ID: Brent Duarte, male    DOB: 02/04/1956, 62 y.o.   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  He takes meds as rx'ed.  He brings a record of his cbg's which I have reviewed today.  cbg varies from 128-191.  There is no trend throughout the day. Past Medical History:  Diagnosis Date  . Arthritis   . Diabetes mellitus without complication (HCC)   . History of chicken pox   . History of kidney stones   . History of measles   . History of mumps   . Hyperlipidemia   . Hypertension   . Imbalance    secondary to back issues  . Multiple gastric ulcers   . Numbness and tingling of both legs   . Pneumonia   . Recurrent falls     Past Surgical History:  Procedure Laterality Date  . bicep tear     right lower arm related to overgrowth of bone as stated per pt   . KNEE ARTHROSCOPY     bilat  . lithrotripsy     for kidney stones  . LUMBAR LAMINECTOMY/DECOMPRESSION MICRODISCECTOMY N/A 09/07/2015   Procedure: MICRO LUMBAR DECOMPRESSION L4 - L5, REMOVAL OF SYNOVIAL CYST L4-L5;  Surgeon: Jene Every, MD;  Location: [facility];  Service: Orthopedics;  Laterality: N/A;  . URETERAL STENT PLACEMENT     secondary to kidney stones/pt states had laser surgery prior     Social History   Socioeconomic History  . Marital status: Married    Spouse name: Not on file  . Number of children: Not on file  . Years of education: Not on file  . Highest education level: Not on file  Occupational History  . Not on file  Social Needs  . Financial resource strain: Not on file  . Food insecurity:    Worry: Not on  file    Inability: Not on file  . Transportation needs:    Medical: Not on file    Non-medical: Not on file  Tobacco Use  . Smoking status: Never Smoker  . Smokeless tobacco: Never Used  Substance and Sexual Activity  . Alcohol use: No  . Drug use: No  . Sexual activity: Not on file  Lifestyle  . Physical activity:    Days per week: Not on file    Minutes per session: Not on file  . Stress: Not on file  Relationships  . Social connections:    Talks on phone: Not on file    Gets together: Not on file    Attends religious service: Not on file    Active member of club or organization: Not on file    Attends meetings of clubs or organizations: Not on file    Relationship status: Not on file  . Intimate partner violence:    Fear of current or ex partner: Not on file    Emotionally abused: Not on file    Physically abused: Not on file    Forced sexual activity: Not on file  Other Topics Concern  . Not on file  Social History Narrative  . Not on file  Current Outpatient Medications on File Prior to Visit  Medication Sig Dispense Refill  . allopurinol (ZYLOPRIM) 300 MG tablet Take 300 mg by mouth at bedtime.    . empagliflozin (JARDIANCE) 10 MG TABS tablet Take 5 mg by mouth daily. 45 tablet 3  . gabapentin (NEURONTIN) 300 MG capsule Take 300 mg by mouth 3 (three) times daily.    Marland Kitchen oxyCODONE-acetaminophen (PERCOCET) 10-325 MG tablet Take 1 tablet by mouth every 4 (four) hours as needed for pain. 60 tablet 0  . PRESCRIPTION MEDICATION Apply 1 application topically 2 (two) times daily. Calcitriol ointment 43mcg/g. [facility] [place].    . sitaGLIPtin-metformin (JANUMET) 50-500 MG tablet Take 1 tablet by mouth 2 (two) times daily with a meal.    . testosterone cypionate (DEPOTESTOSTERONE CYPIONATE) 200 MG/ML injection Inject 200 mg into the muscle every 30 (thirty) days.  5   No current facility-administered medications on file prior to visit.     Allergies  Allergen  Reactions  . Morphine And Related Other (See Comments)    Pt states does not work at relieving pain - states can not tell it works at all     Family History  Problem Relation Age of Onset  . Lung cancer Mother   . Lung cancer Father   . Diabetes Neg Hx     BP 124/70 (BP Location: Right Arm, Patient Position: Sitting, Cuff Size: Large)   Pulse 80   Ht 5\' 11"  (1.803 m)   Wt 282 lb 3.2 oz (128 kg)   SpO2 94%   BMI 39.36 kg/m    Review of Systems He denies hypoglycemia    Objective:   Physical Exam VITAL SIGNS:  See vs page GENERAL: no distress Pulses: dorsalis pedis intact bilat.   MSK: no deformity of the feet CV: 1+ left leg edema (trace on the right), and bilat vv's Skin:  no ulcer on the feet.  normal color and temp on the feet. Neuro: sensation is intact to touch on the feet Ext: There is bilateral onychomycosis of the toenails   outside test results are reviewed: A1c=5.7%  Lab Results  Component Value Date   CREATININE 1.25 (H) 08/31/2015   BUN 26 (H) 08/31/2015   NA 138 08/31/2015   K 4.3 08/31/2015   CL 105 08/31/2015   CO2 26 08/31/2015       Assessment & Plan:  Type 2 DM: overcontrolled, for this SU-containing regimen Edema: this limits rx options Renal failure: in this setting, we should try to phase out SU.  Patient Instructions  Please reduce the glimepiride to 1/2 pill each morning, and: Please continue the same other diabetes medications I have sent 2 prescriptions to your pharmacy: to reduce the glimepiride, and to add jardiance at a lower dosage. check your blood sugar once a day.  vary the time of day when you check, between before the 3 meals, and at bedtime.  also check if you have symptoms of your blood sugar being too high or too low.  please keep a record of the readings and bring it to your next appointment here (or you can bring the meter itself).  You can write it on any piece of paper.  please call us sooner if your blood sugar goes  below 70, or if you have a lot of readings over 200.  Please come back for a follow-up appointment in 3 months.

## 2018-08-21 DIAGNOSIS — M51369 Other intervertebral disc degeneration, lumbar region without mention of lumbar back pain or lower extremity pain: Secondary | ICD-10-CM | POA: Insufficient documentation

## 2018-08-21 DIAGNOSIS — Z79891 Long term (current) use of opiate analgesic: Secondary | ICD-10-CM | POA: Insufficient documentation

## 2018-08-21 DIAGNOSIS — M5136 Other intervertebral disc degeneration, lumbar region: Secondary | ICD-10-CM | POA: Insufficient documentation

## 2018-11-04 ENCOUNTER — Ambulatory Visit (INDEPENDENT_AMBULATORY_CARE_PROVIDER_SITE_OTHER): Payer: 59 | Admitting: Endocrinology

## 2018-11-04 VITALS — BP 144/80 | HR 97 | Ht 71.0 in | Wt 275.2 lb

## 2018-11-04 DIAGNOSIS — E1122 Type 2 diabetes mellitus with diabetic chronic kidney disease: Secondary | ICD-10-CM

## 2018-11-04 DIAGNOSIS — N183 Chronic kidney disease, stage 3 (moderate): Secondary | ICD-10-CM | POA: Diagnosis not present

## 2018-11-04 LAB — POCT GLYCOSYLATED HEMOGLOBIN (HGB A1C): Hemoglobin A1C: 6.9 % — AB (ref 4.0–5.6)

## 2018-11-04 MED ORDER — GLIMEPIRIDE 1 MG PO TABS: 1.0000 mg | ORAL_TABLET | Freq: Every day | ORAL | 3 refills | Status: DC

## 2018-11-04 MED ORDER — METFORMIN HCL ER 500 MG PO TB24: 1000.0000 mg | ORAL_TABLET | Freq: Every day | ORAL | 3 refills | Status: DC

## 2018-11-04 NOTE — Patient Instructions (Addendum)
Your blood pressure is high today.  Please see your primary care provider soon, to have it rechecked Please continue the glimepiride: 1 pill each morning, and: Change janumet to metformin only Please continue the same other diabetes medications.  I have sent 2 prescriptions to your pharmacy: to reduce the glimepiride, and to add jardiance at a lower dosage. check your blood sugar once a day.  vary the time of day when you check, between before the 3 meals, and at bedtime.  also check if you have symptoms of your blood sugar being too high or too low.  please keep a record of the readings and bring it to your next appointment here (or you can bring the meter itself).  You can write it on any piece of paper.  please call us sooner if your blood sugar goes below 70, or if you have a lot of readings over 200.  Please come back for a follow-up appointment in 2 months.

## 2018-11-04 NOTE — Progress Notes (Signed)
Subjective:    Patient ID: Brent Duarte, male    DOB: 07-17-1956, 63 y.o.   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  Interval history: pt had a steroid injection into the back 1 month ago.  He takes meds as rx'ed.  Pt says cbg's are in the mid-100's. There is no trend throughout the day.  He has increased glimepiride to 1 mg qam.  Past Medical History:  Diagnosis Date  . Arthritis   . Diabetes mellitus without complication (HCC)   . History of chicken pox   . History of kidney stones   . History of measles   . History of mumps   . Hyperlipidemia   . Hypertension   . Imbalance    secondary to back issues  . Multiple gastric ulcers   . Numbness and tingling of both legs   . Pneumonia   . Recurrent falls     Past Surgical History:  Procedure Laterality Date  . bicep tear     right lower arm related to overgrowth of bone as stated per pt   . KNEE ARTHROSCOPY     bilat  . lithrotripsy     for kidney stones  . LUMBAR LAMINECTOMY/DECOMPRESSION MICRODISCECTOMY N/A 09/07/2015   Procedure: MICRO LUMBAR DECOMPRESSION L4 - L5, REMOVAL OF SYNOVIAL CYST L4-L5;  Surgeon: Jene Every, MD;  Location: [facility];  Service: Orthopedics;  Laterality: N/A;  . URETERAL STENT PLACEMENT     secondary to kidney stones/pt states had laser surgery prior     Social History   Socioeconomic History  . Marital status: Married    Spouse name: Not on file  . Number of children: Not on file  . Years of education: Not on file  . Highest education level: Not on file  Occupational History  . Not on file  Social Needs  . Financial resource strain: Not on file  . Food  insecurity:    Worry: Not on file    Inability: Not on file  . Transportation needs:    Medical: Not on file    Non-medical: Not on file  Tobacco Use  . Smoking status: Never Smoker  . Smokeless tobacco: Never Used  Substance and Sexual Activity  . Alcohol use: No  . Drug use: No  . Sexual activity: Not on file  Lifestyle  . Physical activity:    Days per week: Not on file    Minutes per session: Not on file  . Stress: Not on file  Relationships  . Social connections:    Talks on phone: Not on file    Gets together: Not on file    Attends religious service: Not on file    Active member of club or organization: Not on file    Attends meetings of clubs or organizations: Not on file    Relationship status: Not on file  . Intimate partner violence:    Fear of current or ex partner: Not on file    Emotionally abused: Not on file    Physically abused: Not on file    Forced sexual activity: Not on file  Other Topics Concern  . Not on file  Social History  Narrative  . Not on file    Current Outpatient Medications on File Prior to Visit  Medication Sig Dispense Refill  . allopurinol (ZYLOPRIM) 300 MG tablet Take 300 mg by mouth daily.     . Dupilumab (DUPIXENT) 300 MG/2ML SOSY Inject 300 mg into the skin every 14 (fourteen) days.    . empagliflozin (JARDIANCE) 10 MG TABS tablet Take 5 mg by mouth daily. 45 tablet 3  . gabapentin (NEURONTIN) 300 MG capsule Take 300 mg by mouth as needed.     Marland Kitchen oxyCODONE-acetaminophen (PERCOCET) 10-325 MG tablet Take 1 tablet by mouth every 4 (four) hours as needed for pain. 60 tablet 0  . PRESCRIPTION MEDICATION Apply 1 application topically 2 (two) times daily. Calcitriol ointment 71mcg/g. [facility] [place].    Marland Kitchen testosterone cypionate (DEPOTESTOSTERONE CYPIONATE) 200 MG/ML injection Inject 200 mg into the muscle every 30 (thirty) days.  5   No current facility-administered medications on file prior to visit.     Allergies    Allergen Reactions  . Bromocriptine Other (See Comments)    GI issues  . Ciprofloxacin Other (See Comments)    "Tendon issues"  . Jardiance [Empagliflozin] Other (See Comments)    Frequent urination  . Trulicity [Dulaglutide] Other (See Comments)    GI issues  . Morphine And Related Other (See Comments)    Pt states does not work at relieving pain - states can not tell it works at all     Family History  Problem Relation Age of Onset  . Lung cancer Mother   . Lung cancer Father   . Diabetes Neg Hx     BP (!) 144/80 (BP Location: Right Arm, Patient Position: Sitting, Cuff Size: Large)   Pulse 97   Ht 5\' 11"  (1.803 m)   Wt 275 lb 3.2 oz (124.8 kg)   SpO2 96%   BMI 38.38 kg/m    Review of Systems He denies hypoglycemia    Objective:   Physical Exam VITAL SIGNS:  See vs page GENERAL: no distress Pulses: dorsalis pedis intact bilat.   MSK: no deformity of the feet CV: 1+ left leg edema (trace on the right), and bilat vv's Skin:  no ulcer on the feet.  normal color and temp on the feet. Neuro: sensation is intact to touch on the feet Ext: There is bilateral onychomycosis of the toenails  Lab Results  Component Value Date   HGBA1C 6.3 (A) 11/04/2018       Assessment & Plan:  Type 2 DM, with PN: overcontrolled, given this SU-containing regimen.   Back pain: steroid rx is increasing a1c Pancreatitis: this limits rx options  Patient Instructions  Your blood pressure is high today.  Please see your primary care provider soon, to have it rechecked Please continue the glimepiride: 1 pill each morning, and: Change janumet to metformin only Please continue the same other diabetes medications.  I have sent 2 prescriptions to your pharmacy: to reduce the glimepiride, and to add jardiance at a lower dosage. check your blood sugar once a day.  vary the time of day when you check, between before the 3 meals, and at bedtime.  also check if you have symptoms of your blood  sugar being too high or too low.  please keep a record of the readings and bring it to your next appointment here (or you can bring the meter itself).  You can write it on any piece of paper.  please call us  sooner if your blood sugar goes below 70, or if you have a lot of readings over 200.  Please come back for a follow-up appointment in 2 months.

## 2019-01-05 ENCOUNTER — Encounter: Payer: Self-pay | Admitting: Endocrinology

## 2019-01-05 ENCOUNTER — Other Ambulatory Visit: Payer: Self-pay

## 2019-01-05 ENCOUNTER — Ambulatory Visit: Payer: 59 | Admitting: Endocrinology

## 2019-01-05 VITALS — BP 140/80 | HR 88 | Temp 98.0°F | Ht 71.0 in | Wt 285.4 lb

## 2019-01-05 DIAGNOSIS — N183 Chronic kidney disease, stage 3 (moderate): Secondary | ICD-10-CM | POA: Diagnosis not present

## 2019-01-05 DIAGNOSIS — E1122 Type 2 diabetes mellitus with diabetic chronic kidney disease: Secondary | ICD-10-CM

## 2019-01-05 LAB — POCT GLYCOSYLATED HEMOGLOBIN (HGB A1C): HEMOGLOBIN A1C: 7.7 % — AB (ref 4.0–5.6)

## 2019-01-05 MED ORDER — GLUCOSE BLOOD VI STRP: 1.0000 | ORAL_STRIP | Freq: Every day | 3 refills | Status: DC

## 2019-01-05 MED ORDER — REPAGLINIDE 2 MG PO TABS: 2.0000 mg | ORAL_TABLET | Freq: Three times a day (TID) | ORAL | 3 refills | Status: DC

## 2019-01-05 MED ORDER — EMPAGLIFLOZIN 10 MG PO TABS: 5.0000 mg | ORAL_TABLET | Freq: Every day | ORAL | 3 refills | Status: DC

## 2019-01-05 NOTE — Patient Instructions (Addendum)
Your blood pressure is high today.  Please see your primary care provider soon, to have it rechecked Please change the glimepiride to repaglinide.  I have sent a prescription to your pharmacy, and: Please continue the same other diabetes medications.  Here is a new meter.  I have sent a prescription to your pharmacy, for strips.  check your blood sugar once a day.  vary the time of day when you check, between before the 3 meals, and at bedtime.  also check if you have symptoms of your blood sugar being too high or too low.  please keep a record of the readings and bring it to your next appointment here (or you can bring the meter itself).  You can write it on any piece of paper.  please call us sooner if your blood sugar goes below 70, or if you have a lot of readings over 200.  Please come back for a follow-up appointment in 2 months.

## 2019-01-05 NOTE — Progress Notes (Signed)
Subjective:    Patient ID: Brent Duarte, male    DOB: 1956/01/06, 63 y.o.   MRN: [id number]  HPI Pt returns for f/u of diabetes mellitus: DM type: 2 Dx'ed: 2017 Complications: polyneuropathy Therapy: 3 oral meds DKA: never Severe hypoglycemia: never Pancreatitis: once, in 2018 Pancreatic imaging: none available.  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  Interval history: no cbg record, but states cbg's vary from 156-300.  pt states he feels well in general, except for back pain.  No recent steroids.  Past Medical History:  Diagnosis Date  . Arthritis   . Diabetes mellitus without complication (HCC)   . History of chicken pox   . History of kidney stones   . History of measles   . History of mumps   . Hyperlipidemia   . Hypertension   . Imbalance    secondary to back issues  . Multiple gastric ulcers   . Numbness and tingling of both legs   . Pneumonia   . Recurrent falls     Past Surgical History:  Procedure Laterality Date  . bicep tear     right lower arm related to overgrowth of bone as stated per pt   . KNEE ARTHROSCOPY     bilat  . lithrotripsy     for kidney stones  . LUMBAR LAMINECTOMY/DECOMPRESSION MICRODISCECTOMY N/A 09/07/2015   Procedure: MICRO LUMBAR DECOMPRESSION L4 - L5, REMOVAL OF SYNOVIAL CYST L4-L5;  Surgeon: Jene Every, MD;  Location: [facility];  Service: Orthopedics;  Laterality: N/A;  . URETERAL STENT PLACEMENT     secondary to kidney stones/pt states had laser surgery prior     Social History   Socioeconomic History  . Marital status: Married    Spouse name: Not on file  . Number of children: Not on file  . Years of education: Not on file  . Highest education level: Not on file  Occupational History  . Not on file  Social Needs  . Financial resource strain: Not on file  . Food insecurity:    Worry: Not on file    Inability: Not on file  .  Transportation needs:    Medical: Not on file    Non-medical: Not on file  Tobacco Use  . Smoking status: Never Smoker  . Smokeless tobacco: Never Used  Substance and Sexual Activity  . Alcohol use: No  . Drug use: No  . Sexual activity: Not on file  Lifestyle  . Physical activity:    Days per week: Not on file    Minutes per session: Not on file  . Stress: Not on file  Relationships  . Social connections:    Talks on phone: Not on file    Gets together: Not on file    Attends religious service: Not on file    Active member of club or organization: Not on file    Attends meetings of clubs or organizations: Not on file    Relationship status: Not on file  . Intimate partner violence:    Fear of current or ex partner: Not on file    Emotionally abused: Not on file    Physically abused: Not on file    Forced sexual activity: Not on file  Other Topics Concern  . Not on file  Social History Narrative  . Not on file    Current Outpatient Medications on File Prior  to Visit  Medication Sig Dispense Refill  . allopurinol (ZYLOPRIM) 300 MG tablet Take 300 mg by mouth daily.     . Dupilumab (DUPIXENT) 300 MG/2ML SOSY Inject 300 mg into the skin every 14 (fourteen) days.    Marland Kitchen gabapentin (NEURONTIN) 300 MG capsule Take 300 mg by mouth as needed.     . metFORMIN (GLUCOPHAGE-XR) 500 MG 24 hr tablet Take 2 tablets (1,000 mg total) by mouth daily with breakfast. 180 tablet 3  . oxyCODONE-acetaminophen (PERCOCET) 10-325 MG tablet Take 1 tablet by mouth every 4 (four) hours as needed for pain. 60 tablet 0  . PRESCRIPTION MEDICATION Apply 1 application topically 2 (two) times daily. Calcitriol ointment 23mcg/g. [facility] [place].    Marland Kitchen testosterone cypionate (DEPOTESTOSTERONE CYPIONATE) 200 MG/ML injection Inject 200 mg into the muscle every 30 (thirty) days.  5   No current facility-administered medications on file prior to visit.     Allergies  Allergen Reactions  .  Bromocriptine Other (See Comments)    GI issues  . Ciprofloxacin Other (See Comments)    "Tendon issues"  . Jardiance [Empagliflozin] Other (See Comments)    Frequent urination  . Trulicity [Dulaglutide] Other (See Comments)    GI issues  . Morphine And Related Other (See Comments)    Pt states does not work at relieving pain - states can not tell it works at all     Family History  Problem Relation Age of Onset  . Lung cancer Mother   . Lung cancer Father   . Diabetes Neg Hx     BP 140/80 (BP Location: Left Arm, Patient Position: Sitting, Cuff Size: Normal)   Pulse 88   Temp 98 F (36.7 C) (Oral)   Ht 5\' 11"  (1.803 m)   Wt 285 lb 6.4 oz (129.5 kg)   SpO2 98%   BMI 39.81 kg/m   Review of Systems He denies hypoglycemia    Objective:   Physical Exam VITAL SIGNS:  See vs page GENERAL: no distress Pulses: dorsalis pedis intact bilat.   MSK: no deformity of the feet CV: 1+ bilat leg edema, and bilat vv's.  Skin:  no ulcer on the feet.  normal color and temp on the feet. Neuro: sensation is intact to touch on the feet Ext: There is bilateral onychomycosis of the toenails  Lab Results  Component Value Date   HGBA1C 7.7 (A) 01/05/2019      Assessment & Plan:  Type 2 DM: worse Edema: This limits rx options occupational status: he should control DM without insulin HTN: is noted today  Patient Instructions  Your blood pressure is high today.  Please see your primary care provider soon, to have it rechecked Please change the glimepiride to repaglinide.  I have sent a prescription to your pharmacy, and: Please continue the same other diabetes medications.  Here is a new meter.  I have sent a prescription to your pharmacy, for strips.  check your blood sugar once a day.  vary the time of day when you check, between before the 3 meals, and at bedtime.  also check if you have symptoms of your blood sugar being too high or too low.  please keep a record of the readings and  bring it to your next appointment here (or you can bring the meter itself).  You can write it on any piece of paper.  please call us sooner if your blood sugar goes below 70, or if you have  a lot of readings over 200.  Please come back for a follow-up appointment in 2 months.

## 2019-03-03 ENCOUNTER — Other Ambulatory Visit: Payer: Self-pay

## 2019-03-05 ENCOUNTER — Ambulatory Visit (INDEPENDENT_AMBULATORY_CARE_PROVIDER_SITE_OTHER): Payer: 59 | Admitting: Endocrinology

## 2019-03-05 ENCOUNTER — Other Ambulatory Visit: Payer: Self-pay

## 2019-03-05 ENCOUNTER — Ambulatory Visit: Payer: 59 | Admitting: Endocrinology

## 2019-03-05 ENCOUNTER — Encounter: Payer: Self-pay | Admitting: Endocrinology

## 2019-03-05 VITALS — BP 150/82 | HR 85 | Ht 71.0 in | Wt 286.2 lb

## 2019-03-05 DIAGNOSIS — E1122 Type 2 diabetes mellitus with diabetic chronic kidney disease: Secondary | ICD-10-CM | POA: Diagnosis not present

## 2019-03-05 DIAGNOSIS — N183 Chronic kidney disease, stage 3 unspecified: Secondary | ICD-10-CM

## 2019-03-05 LAB — POCT GLYCOSYLATED HEMOGLOBIN (HGB A1C): Hemoglobin A1C: 6.8 % — AB (ref 4.0–5.6)

## 2019-03-05 MED ORDER — ATORVASTATIN CALCIUM 40 MG PO TABS: 40.0000 mg | ORAL_TABLET | Freq: Every day | ORAL | 3 refills | Status: DC

## 2019-03-05 MED ORDER — COLESEVELAM HCL 625 MG PO TABS: 1250.0000 mg | ORAL_TABLET | Freq: Every day | ORAL | 3 refills | Status: DC

## 2019-03-05 MED ORDER — METFORMIN HCL ER 500 MG PO TB24: 500.0000 mg | ORAL_TABLET | Freq: Every day | ORAL | 3 refills | Status: DC

## 2019-03-05 NOTE — Patient Instructions (Addendum)
Your blood pressure is high today.  Please see your primary care provider soon, to have it rechecked I have sent a prescription to your pharmacy, to add welchol, and: Reduce the metformin to 1 pill per day, and: Please continue the same other diabetes medications.  check your blood sugar once a day.  vary the time of day when you check, between before the 3 meals, and at bedtime.  also check if you have symptoms of your blood sugar being too high or too low.  please keep a record of the readings and bring it to your next appointment here (or you can bring the meter itself).  You can write it on any piece of paper.  please call us sooner if your blood sugar goes below 70, or if you have a lot of readings over 200.  Please come back for a follow-up appointment in 3 months.

## 2019-03-05 NOTE — Progress Notes (Signed)
Subjective:    Patient ID: Brent Duarte, male    DOB: 03/17/1956, 63 y.o.   MRN: [id number]  HPI Pt returns for f/u of diabetes mellitus: DM type: 2 Dx'ed: 2017 Complications: PN and renal insuff Therapy: 3 oral meds DKA: never Severe hypoglycemia: never Pancreatitis: once, in 2018 Pancreatic imaging: none available.  Other: he has never been on insulin; He had pancreatitis with trulicity; He needs to control DM without insulin, due to CDL; edema limits rx options; renal insuff limits rx options and dosages.   Interval history: He brings a record of his cbg's which I have reviewed today.  cbg's vary from 140-216.  pt states he feels well in general.   No recent steroids.   Past Medical History:  Diagnosis Date  . Arthritis   . Diabetes mellitus without complication (HCC)   . History of chicken pox   . History of kidney stones   . History of measles   . History of mumps   . Hyperlipidemia   . Hypertension   . Imbalance    secondary to back issues  . Multiple gastric ulcers   . Numbness and tingling of both legs   . Pneumonia   . Recurrent falls     Past Surgical History:  Procedure Laterality Date  . bicep tear     right lower arm related to overgrowth of bone as stated per pt   . KNEE ARTHROSCOPY     bilat  . lithrotripsy     for kidney stones  . LUMBAR LAMINECTOMY/DECOMPRESSION MICRODISCECTOMY N/A 09/07/2015   Procedure: MICRO LUMBAR DECOMPRESSION L4 - L5, REMOVAL OF SYNOVIAL CYST L4-L5;  Surgeon: Jene EveryJeffrey Beane, MD;  Location: [facility];  Service: Orthopedics;  Laterality: N/A;  . URETERAL STENT PLACEMENT     secondary to kidney stones/pt states had laser surgery prior     Social History   Socioeconomic History  . Marital status: Married    Spouse name: Not on file  . Number of children: Not on file  . Years of education: Not on file  . Highest education level: Not on file  Occupational History  . Not on file  Social Needs  . Financial resource strain: Not  on file  . Food insecurity:    Worry: Not on file    Inability: Not on file  . Transportation needs:    Medical: Not on file    Non-medical: Not on file  Tobacco Use  . Smoking status: Never Smoker  . Smokeless tobacco: Never Used  Substance and Sexual Activity  . Alcohol use: No  . Drug use: No  . Sexual activity: Not on file  Lifestyle  . Physical activity:    Days per week: Not on file    Minutes per session: Not on file  . Stress: Not on file  Relationships  . Social connections:    Talks on phone: Not on file    Gets together: Not on file    Attends religious service: Not on file    Active member of club or organization: Not on file    Attends meetings of clubs or organizations: Not on file    Relationship status: Not on file  . Intimate partner violence:    Fear of current or ex partner: Not on file    Emotionally abused: Not on file    Physically abused: Not on file    Forced sexual activity: Not on file  Other Topics Concern  .  Not on file  Social History Narrative  . Not on file    Current Outpatient Medications on File Prior to Visit  Medication Sig Dispense Refill  . allopurinol (ZYLOPRIM) 300 MG tablet Take 300 mg by mouth daily.     . Dupilumab (DUPIXENT) 300 MG/2ML SOSY Inject 300 mg into the skin every 14 (fourteen) days.    . empagliflozin (JARDIANCE) 10 MG TABS tablet Take 5 mg by mouth daily. 45 tablet 3  . gabapentin (NEURONTIN) 300 MG capsule Take 300 mg by mouth as needed.     Marland Kitchen glucose blood (ONETOUCH VERIO) test strip 1 each by Other route daily. Use as instructed 100 each 3  . oxyCODONE-acetaminophen (PERCOCET) 10-325 MG tablet Take 1 tablet by mouth every 4 (four) hours as needed for pain. 60 tablet 0  . PRESCRIPTION MEDICATION Apply 1 application topically 2 (two) times daily. Calcitriol ointment 41mcg/g. [facility] [place].    . repaglinide (PRANDIN) 2 MG tablet Take 1 tablet (2 mg total) by mouth 3 (three) times daily before meals.  270 tablet 3  . testosterone cypionate (DEPOTESTOSTERONE CYPIONATE) 200 MG/ML injection Inject 200 mg into the muscle every 30 (thirty) days.  5   No current facility-administered medications on file prior to visit.     Allergies  Allergen Reactions  . Bromocriptine Other (See Comments)    GI issues  . Ciprofloxacin Other (See Comments)    "Tendon issues"  . Jardiance [Empagliflozin] Other (See Comments)    Frequent urination  . Trulicity [Dulaglutide] Other (See Comments)    GI issues  . Morphine And Related Other (See Comments)    Pt states does not work at relieving pain - states can not tell it works at all     Family History  Problem Relation Age of Onset  . Lung cancer Mother   . Lung cancer Father   . Diabetes Neg Hx     BP (!) 150/82 (BP Location: Left Arm, Patient Position: Sitting, Cuff Size: Large)   Pulse 85   Ht  (1.803 m)   Wt 286 lb 3.2 oz (129.8 kg)   SpO2 94%   BMI 39.92 kg/m   Review of Systems He denies hypoglycemia    Objective:   Physical Exam VITAL SIGNS:  See vs page GENERAL: no distress Pulses: dorsalis pedis intact bilat.   MSK: no deformity of the feet CV: 1+ bilat leg edema, and bilat vv's. Skin:  no ulcer on the feet.  normal color and temp on the feet.  Neuro: sensation is intact to touch on the feet, but decreased from normal.   Ext: There is bilateral onychomycosis of the toenails.     Lab Results  Component Value Date   HGBA1C 6.8 (A) 03/05/2019   Lab Results  Component Value Date   CREATININE 1.25 (H) 08/31/2015   BUN 26 (H) 08/31/2015   NA 138 08/31/2015   K 4.3 08/31/2015   CL 105 08/31/2015   CO2 26 08/31/2015      Assessment & Plan:  Type 2 DM: well-controlled.  HTN: is noted today Renal insuff: This limits rx options and dosages  Patient Instructions  Your blood pressure is high today.  Please see your primary care provider soon, to have it rechecked I have sent a prescription to your pharmacy, to add  welchol, and: Reduce the metformin to 1 pill per day, and: Please continue the same other diabetes medications.  check your blood sugar once  a day.  vary the time of day when you check, between before the 3 meals, and at bedtime.  also check if you have symptoms of your blood sugar being too high or too low.  please keep a record of the readings and bring it to your next appointment here (or you can bring the meter itself).  You can write it on any piece of paper.  please call us sooner if your blood sugar goes below 70, or if you have a lot of readings over 200.  Please come back for a follow-up appointment in 3 months.

## 2019-05-12 ENCOUNTER — Other Ambulatory Visit: Payer: Self-pay | Admitting: Endocrinology

## 2019-06-02 ENCOUNTER — Other Ambulatory Visit: Payer: Self-pay

## 2019-06-04 ENCOUNTER — Ambulatory Visit: Payer: 59 | Admitting: Endocrinology

## 2019-06-04 ENCOUNTER — Other Ambulatory Visit: Payer: Self-pay

## 2019-06-04 ENCOUNTER — Encounter: Payer: Self-pay | Admitting: Endocrinology

## 2019-06-04 VITALS — BP 100/52 | HR 60 | Ht 71.0 in | Wt 281.6 lb

## 2019-06-04 DIAGNOSIS — N183 Chronic kidney disease, stage 3 unspecified: Secondary | ICD-10-CM

## 2019-06-04 DIAGNOSIS — E1122 Type 2 diabetes mellitus with diabetic chronic kidney disease: Secondary | ICD-10-CM

## 2019-06-04 LAB — POCT GLYCOSYLATED HEMOGLOBIN (HGB A1C): Hemoglobin A1C: 7.3 % — AB (ref 4.0–5.6)

## 2019-06-04 NOTE — Patient Instructions (Addendum)
Please continue the same diabetes medications.  Try getting 3 repaglinide pills in each day by taking 2 with a large meal.  check your blood sugar once a day.  vary the time of day when you check, between before the 3 meals, and at bedtime.  also check if you have symptoms of your blood sugar being too high or too low.  please keep a record of the readings and bring it to your next appointment here (or you can bring the meter itself).  You can write it on any piece of paper.  please call us sooner if your blood sugar goes below 70, or if you have a lot of readings over 200.  Please come back for a follow-up appointment in 3 months.

## 2019-06-04 NOTE — Progress Notes (Signed)
Subjective:    Patient ID: Brent Duarte, male    DOB: 01/03/1956, 63 y.o.   MRN: [id number]  HPI Pt returns for f/u of diabetes mellitus: DM type: 2 Dx'ed: 2017 Complications: PN and renal insuff Therapy: 4 oral meds DKA: never Severe hypoglycemia: never Pancreatitis: once, in 2018 Pancreatic imaging: none available.  Other: he has never been on insulin; He had pancreatitis with trulicity; He needs to control DM without insulin, due to CDL; edema limits rx options; renal insuff limits rx options and dosages.   Interval history: Meter is downloaded today, and the printout is scanned into the record.  cbg's vary from 150-227.  All are checked fasting.  pt states he feels well in general, except for chronic low back pain.   No recent steroids. He eats less than 3 meals per day, so he takes less than 3 doses of repaglinide per day.   Past Medical History:  Diagnosis Date  . Arthritis   . Diabetes mellitus without complication (HCC)   . History of chicken pox   . History of kidney stones   . History of measles   . History of mumps   . Hyperlipidemia   . Hypertension   . Imbalance    secondary to back issues  . Multiple gastric ulcers   . Numbness and tingling of both legs   . Pneumonia   . Recurrent falls     Past Surgical History:  Procedure Laterality Date  . bicep tear     right lower arm related to overgrowth of bone as stated per pt   . KNEE ARTHROSCOPY     bilat  . lithrotripsy     for kidney stones  . LUMBAR LAMINECTOMY/DECOMPRESSION MICRODISCECTOMY N/A 09/07/2015   Procedure: MICRO LUMBAR DECOMPRESSION L4 - L5, REMOVAL OF SYNOVIAL CYST L4-L5;  Surgeon: Jene EveryJeffrey Beane, MD;  Location: [facility];  Service: Orthopedics;  Laterality: N/A;  . URETERAL STENT PLACEMENT     secondary to kidney stones/pt states had laser surgery prior     Social History   Socioeconomic History  . Marital status: Married    Spouse name: Not on file  . Number of children: Not on file  .  Years of education: Not on file  . Highest education level: Not on file  Occupational History  . Not on file  Social Needs  . Financial resource strain: Not on file  . Food insecurity    Worry: Not on file    Inability: Not on file  . Transportation needs    Medical: Not on file    Non-medical: Not on file  Tobacco Use  . Smoking status: Never Smoker  . Smokeless tobacco: Never Used  Substance and Sexual Activity  . Alcohol use: No  . Drug use: No  . Sexual activity: Not on file  Lifestyle  . Physical activity    Days per week: Not on file    Minutes per session: Not on file  . Stress: Not on file  Relationships  . Social Musicianconnections    Talks on phone: Not on file    Gets together: Not on file    Attends religious service: Not on file    Active member of club or organization: Not on file    Attends meetings of clubs or organizations: Not on file    Relationship status: Not on file  . Intimate partner violence    Fear of current or ex partner: Not on file  Emotionally abused: Not on file    Physically abused: Not on file    Forced sexual activity: Not on file  Other Topics Concern  . Not on file  Social History Narrative  . Not on file    Current Outpatient Medications on File Prior to Visit  Medication Sig Dispense Refill  . allopurinol (ZYLOPRIM) 300 MG tablet Take 300 mg by mouth daily.     Marland Kitchen atorvastatin (LIPITOR) 40 MG tablet Take 1 tablet (40 mg total) by mouth daily. 90 tablet 3  . colesevelam (WELCHOL) 625 MG tablet Take 2 tablets (1,250 mg total) by mouth daily. 180 tablet 3  . Dupilumab (DUPIXENT) 300 MG/2ML SOSY Inject 300 mg into the skin every 14 (fourteen) days.    Marland Kitchen gabapentin (NEURONTIN) 300 MG capsule Take 300 mg by mouth as needed.     Marland Kitchen glucose blood (ONETOUCH VERIO) test strip 1 each by Other route daily. Use as instructed 100 each 3  . JARDIANCE 10 MG TABS tablet TAKE 1 TABLET BY MOUTH EVERY DAY 30 tablet 2  . metFORMIN (GLUCOPHAGE-XR) 500 MG  24 hr tablet Take 1 tablet (500 mg total) by mouth daily with breakfast. 90 tablet 3  . oxyCODONE-acetaminophen (PERCOCET) 10-325 MG tablet Take 1 tablet by mouth every 4 (four) hours as needed for pain. 60 tablet 0  . PRESCRIPTION MEDICATION Apply 1 application topically 2 (two) times daily. Calcitriol ointment 75mcg/g. [facility] [place].    . repaglinide (PRANDIN) 2 MG tablet Take 1 tablet (2 mg total) by mouth 3 (three) times daily before meals. 270 tablet 3  . testosterone cypionate (DEPOTESTOSTERONE CYPIONATE) 200 MG/ML injection Inject 200 mg into the muscle every 30 (thirty) days.  5   No current facility-administered medications on file prior to visit.     Allergies  Allergen Reactions  . Bromocriptine Other (See Comments)    GI issues  . Ciprofloxacin Other (See Comments)    "Tendon issues"  . Jardiance [Empagliflozin] Other (See Comments)    Frequent urination  . Trulicity [Dulaglutide] Other (See Comments)    GI issues  . Morphine And Related Other (See Comments)    Pt states does not work at relieving pain - states can not tell it works at all     Family History  Problem Relation Age of Onset  . Lung cancer Mother   . Lung cancer Father   . Diabetes Neg Hx     BP (!) 100/52 (BP Location: Left Arm, Patient Position: Sitting, Cuff Size: Large)   Pulse 60   Ht 5\' 11"  (1.803 m)   Wt 281 lb 9.6 oz (127.7 kg)   SpO2 98%   BMI 39.28 kg/m    Review of Systems He denies hypoglycemia.      Objective:   Physical Exam VITAL SIGNS:  See vs page GENERAL: no distress Pulses: dorsalis pedis intact bilat.   MSK: no deformity of the feet CV: 1+ bilat leg edema Skin:  no ulcer on the feet.  normal color and temp on the feet. Neuro: sensation is intact to touch on the feet, but decreased from normal Ext: there is bilateral onychomycosis of the toenails  Lab Results  Component Value Date   HGBA1C 7.3 (A) 06/04/2019       Assessment & Plan:  Type 2 DM, with  PN: worse Edema: This limits rx options   Patient Instructions  Please continue the same diabetes medications.  Try getting 3 repaglinide pills in  each day by taking 2 with a large meal.  check your blood sugar once a day.  vary the time of day when you check, between before the 3 meals, and at bedtime.  also check if you have symptoms of your blood sugar being too high or too low.  please keep a record of the readings and bring it to your next appointment here (or you can bring the meter itself).  You can write it on any piece of paper.  please call us sooner if your blood sugar goes below 70, or if you have a lot of readings over 200.  Please come back for a follow-up appointment in 3 months.

## 2019-08-11 ENCOUNTER — Other Ambulatory Visit: Payer: Self-pay | Admitting: Endocrinology

## 2019-09-03 ENCOUNTER — Other Ambulatory Visit: Payer: Self-pay

## 2019-09-07 ENCOUNTER — Other Ambulatory Visit: Payer: Self-pay

## 2019-09-07 ENCOUNTER — Ambulatory Visit (INDEPENDENT_AMBULATORY_CARE_PROVIDER_SITE_OTHER): Payer: 59 | Admitting: Endocrinology

## 2019-09-07 ENCOUNTER — Encounter: Payer: Self-pay | Admitting: Endocrinology

## 2019-09-07 VITALS — BP 110/62 | HR 105 | Ht 71.0 in | Wt 283.8 lb

## 2019-09-07 DIAGNOSIS — R609 Edema, unspecified: Secondary | ICD-10-CM | POA: Diagnosis not present

## 2019-09-07 DIAGNOSIS — E1121 Type 2 diabetes mellitus with diabetic nephropathy: Secondary | ICD-10-CM | POA: Diagnosis not present

## 2019-09-07 DIAGNOSIS — N1831 Chronic kidney disease, stage 3a: Secondary | ICD-10-CM | POA: Diagnosis not present

## 2019-09-07 DIAGNOSIS — E119 Type 2 diabetes mellitus without complications: Secondary | ICD-10-CM

## 2019-09-07 LAB — BASIC METABOLIC PANEL
BUN: 20 mg/dL (ref 6–23)
CO2: 24 mEq/L (ref 19–32)
Calcium: 9.5 mg/dL (ref 8.4–10.5)
Chloride: 103 mEq/L (ref 96–112)
Creatinine, Ser: 1.16 mg/dL (ref 0.40–1.50)
GFR: 63.4 mL/min (ref >60.00)
Glucose, Bld: 189 mg/dL — ABNORMAL HIGH (ref 70–99)
Potassium: 4.4 mEq/L (ref 3.5–5.1)
Sodium: 137 mEq/L (ref 135–145)

## 2019-09-07 LAB — POCT GLYCOSYLATED HEMOGLOBIN (HGB A1C): Hemoglobin A1C: 7.6 % — AB (ref 4.0–5.6)

## 2019-09-07 LAB — TSH: TSH: 1.89 u[IU]/mL (ref 0.35–4.50)

## 2019-09-07 MED ORDER — JARDIANCE 25 MG PO TABS: 25.0000 mg | ORAL_TABLET | Freq: Every day | ORAL | 3 refills | Status: DC

## 2019-09-07 MED ORDER — COLESEVELAM HCL 625 MG PO TABS: 1875.0000 mg | ORAL_TABLET | Freq: Every day | ORAL | 3 refills | Status: DC

## 2019-09-07 NOTE — Patient Instructions (Addendum)
I have sent prescriptions to your pharmacy, to increase the Jardiance and colesevelam. Please continue the same other diabetes medications.   check your blood sugar once a day.  vary the time of day when you check, between before the 3 meals, and at bedtime.  also check if you have symptoms of your blood sugar being too high or too low.  please keep a record of the readings and bring it to your next appointment here (or you can bring the meter itself).  You can write it on any piece of paper.  please call us sooner if your blood sugar goes below 70, or if you have a lot of readings over 200.  Please come back for a follow-up appointment in 3 months.

## 2019-09-07 NOTE — Progress Notes (Signed)
Subjective:    Patient ID: Brent Duarte, male    DOB: 09/22/1956, 63 y.o.   MRN: [id number]  HPI Pt returns for f/u of diabetes mellitus: DM type: 2 Dx'ed: 2017 Complications: PN and renal insuff Therapy: 4 oral meds DKA: never Severe hypoglycemia: never Pancreatitis: once, in 2018 Pancreatic imaging: none available.  Other: he has never been on insulin; He had pancreatitis with trulicity; He needs to control DM without insulin, due to CDL; edema limits rx options; renal insuff limits rx options and dosages; he did not tolerate bromocriptine (n/v).  Interval history: He brings his meter with his cbg's which I have reviewed today.  cbg's vary from 164-379.  All are checked fasting.  pt states he feels well in general.  No recent steroids.  Past Medical History:  Diagnosis Date  . Arthritis   . Diabetes mellitus without complication (HCC)   . History of chicken pox   . History of kidney stones   . History of measles   . History of mumps   . Hyperlipidemia   . Hypertension   . Imbalance    secondary to back issues  . Multiple gastric ulcers   . Numbness and tingling of both legs   . Pneumonia   . Recurrent falls     Past Surgical History:  Procedure Laterality Date  . bicep tear     right lower arm related to overgrowth of bone as stated per pt   . KNEE ARTHROSCOPY     bilat  . lithrotripsy     for kidney stones  . LUMBAR LAMINECTOMY/DECOMPRESSION MICRODISCECTOMY N/A 09/07/2015   Procedure: MICRO LUMBAR DECOMPRESSION L4 - L5, REMOVAL OF SYNOVIAL CYST L4-L5;  Surgeon: Jene EveryJeffrey Beane, MD;  Location: [facility];  Service: Orthopedics;  Laterality: N/A;  . URETERAL STENT PLACEMENT     secondary to kidney stones/pt states had laser surgery prior     Social History   Socioeconomic History  . Marital status: Married    Spouse name: Not on file  . Number of children: Not on file  . Years of education: Not on file  . Highest education level: Not on file  Occupational History   . Not on file  Social Needs  . Financial resource strain: Not on file  . Food insecurity    Worry: Not on file    Inability: Not on file  . Transportation needs    Medical: Not on file    Non-medical: Not on file  Tobacco Use  . Smoking status: Never Smoker  . Smokeless tobacco: Never Used  Substance and Sexual Activity  . Alcohol use: No  . Drug use: No  . Sexual activity: Not on file  Lifestyle  . Physical activity    Days per week: Not on file    Minutes per session: Not on file  . Stress: Not on file  Relationships  . Social Musicianconnections    Talks on phone: Not on file    Gets together: Not on file    Attends religious service: Not on file    Active member of club or organization: Not on file    Attends meetings of clubs or organizations: Not on file    Relationship status: Not on file  . Intimate partner violence    Fear of current or ex partner: Not on file    Emotionally abused: Not on file    Physically abused: Not on file    Forced sexual activity: Not  on file  Other Topics Concern  . Not on file  Social History Narrative  . Not on file    Current Outpatient Medications on File Prior to Visit  Medication Sig Dispense Refill  . allopurinol (ZYLOPRIM) 300 MG tablet Take 300 mg by mouth daily.     Marland Kitchen atorvastatin (LIPITOR) 40 MG tablet Take 1 tablet (40 mg total) by mouth daily. 90 tablet 3  . Dupilumab (DUPIXENT) 300 MG/2ML SOSY Inject 300 mg into the skin every 14 (fourteen) days.    Marland Kitchen gabapentin (NEURONTIN) 300 MG capsule Take 300 mg by mouth as needed.     Marland Kitchen glucose blood (ONETOUCH VERIO) test strip 1 each by Other route daily. Use as instructed 100 each 3  . metFORMIN (GLUCOPHAGE-XR) 500 MG 24 hr tablet Take 1 tablet (500 mg total) by mouth daily with breakfast. 90 tablet 3  . oxyCODONE-acetaminophen (PERCOCET) 10-325 MG tablet Take 1 tablet by mouth every 4 (four) hours as needed for pain. 60 tablet 0  . PRESCRIPTION MEDICATION Apply 1 application topically  2 (two) times daily. Calcitriol ointment 66mcg/g. [facility] [place].    . repaglinide (PRANDIN) 2 MG tablet Take 1 tablet (2 mg total) by mouth 3 (three) times daily before meals. 270 tablet 3  . testosterone cypionate (DEPOTESTOSTERONE CYPIONATE) 200 MG/ML injection Inject 200 mg into the muscle every 30 (thirty) days.  5   No current facility-administered medications on file prior to visit.     Allergies  Allergen Reactions  . Bromocriptine Other (See Comments)    GI issues  . Ciprofloxacin Other (See Comments)    "Tendon issues"  . Jardiance [Empagliflozin] Other (See Comments)    Frequent urination  . Trulicity [Dulaglutide] Other (See Comments)    GI issues  . Morphine And Related Other (See Comments)    Pt states does not work at relieving pain - states can not tell it works at all     Family History  Problem Relation Age of Onset  . Lung cancer Mother   . Lung cancer Father   . Diabetes Neg Hx     BP 110/62 (BP Location: Right Arm, Patient Position: Sitting, Cuff Size: Large)   Pulse (!) 105   Ht 5\' 11"  (1.803 m)   Wt 283 lb 12.8 oz (128.7 kg)   SpO2 95%   BMI 39.58 kg/m    Review of Systems He denies hypoglycemia.      Objective:   Physical Exam VITAL SIGNS:  See vs page GENERAL: no distress Pulses: dorsalis pedis intact bilat.   MSK: no deformity of the feet CV: 1+ bilat leg edema, and bilat vv's Skin:  no ulcer on the feet.  normal color and temp on the feet. Neuro: sensation is intact to touch on the feet Ext: there is bilateral onychomycosis of the toenails.    A1c=7.6%  Lab Results  Component Value Date   CREATININE 1.25 (H) 08/31/2015   BUN 26 (H) 08/31/2015   NA 138 08/31/2015   K 4.3 08/31/2015   CL 105 08/31/2015   CO2 26 08/31/2015       Assessment & Plan:  Type 2 DM, with PN: worse Edema: This limits rx options Renal insuff: This also limits rx options   Patient Instructions  I have sent prescriptions to your  pharmacy, to increase the Jardiance and colesevelam. Please continue the same other diabetes medications.   check your blood sugar once a day.  vary the time  of day when you check, between before the 3 meals, and at bedtime.  also check if you have symptoms of your blood sugar being too high or too low.  please keep a record of the readings and bring it to your next appointment here (or you can bring the meter itself).  You can write it on any piece of paper.  please call us sooner if your blood sugar goes below 70, or if you have a lot of readings over 200.  Please come back for a follow-up appointment in 3 months.

## 2019-09-08 ENCOUNTER — Telehealth: Payer: Self-pay

## 2019-09-08 NOTE — Telephone Encounter (Signed)
Lab results reviewed by Dr. Ellison. Letter has been mailed. For future reference, letter can be found in Epic. 

## 2019-09-08 NOTE — Telephone Encounter (Signed)
-----   Message from Renato Shin, MD sent at 09/07/2019  6:24 PM EST ----- please contact patient: Good results.  In particular, kidneys are better. I'll see you next time.

## 2019-11-08 ENCOUNTER — Other Ambulatory Visit: Payer: Self-pay | Admitting: Endocrinology

## 2019-12-08 ENCOUNTER — Other Ambulatory Visit: Payer: Self-pay

## 2019-12-10 ENCOUNTER — Ambulatory Visit: Payer: 59 | Admitting: Endocrinology

## 2019-12-10 ENCOUNTER — Ambulatory Visit (INDEPENDENT_AMBULATORY_CARE_PROVIDER_SITE_OTHER): Payer: 59

## 2019-12-10 ENCOUNTER — Encounter: Payer: Self-pay | Admitting: Endocrinology

## 2019-12-10 ENCOUNTER — Other Ambulatory Visit: Payer: Self-pay

## 2019-12-10 VITALS — BP 116/60 | HR 80 | Ht 71.0 in | Wt 277.4 lb

## 2019-12-10 DIAGNOSIS — N1831 Chronic kidney disease, stage 3a: Secondary | ICD-10-CM | POA: Diagnosis not present

## 2019-12-10 DIAGNOSIS — E1121 Type 2 diabetes mellitus with diabetic nephropathy: Secondary | ICD-10-CM

## 2019-12-10 DIAGNOSIS — E291 Testicular hypofunction: Secondary | ICD-10-CM | POA: Diagnosis not present

## 2019-12-10 DIAGNOSIS — E1122 Type 2 diabetes mellitus with diabetic chronic kidney disease: Secondary | ICD-10-CM

## 2019-12-10 LAB — POCT GLYCOSYLATED HEMOGLOBIN (HGB A1C): Hemoglobin A1C: 7.7 % — AB (ref 4.0–5.6)

## 2019-12-10 MED ORDER — ACARBOSE 25 MG PO TABS: 12.5000 mg | ORAL_TABLET | Freq: Three times a day (TID) | ORAL | 3 refills | Status: DC

## 2019-12-10 MED ORDER — TESTOSTERONE CYPIONATE 200 MG/ML IM SOLN
200.0000 mg | INTRAMUSCULAR | Status: DC
  Administered 2019-12-10 – 2020-01-07 (×2): 200 mg via INTRAMUSCULAR

## 2019-12-10 NOTE — Progress Notes (Signed)
Subjective:    Patient ID: Brent Duarte, male    DOB: 05-06-1956, 64 y.o.   MRN: [id number]  HPI Pt returns for f/u of diabetes mellitus: DM type: 2 Dx'ed: 2017 Complications: PN and renal insuff Therapy: 4 oral meds DKA: never Severe hypoglycemia: never Pancreatitis: once, in 2018 Pancreatic imaging: none available.  SDOH: He needs to control DM without insulin, due to CDL Other: he has never been on insulin; He had pancreatitis with trulicity; edema limits rx options; renal insuff limits rx options and dosages; he did not tolerate bromocriptine (n/v).  Interval history: He brings his meter with his cbg's which I have reviewed today.  cbg's vary from 150-294.  All are checked fasting.  pt states he feels well in general.  No recent steroids.  Past Medical History:  Diagnosis Date  . Arthritis   . Diabetes mellitus without complication (HCC)   . History of chicken pox   . History of kidney stones   . History of measles   . History of mumps   . Hyperlipidemia   . Hypertension   . Imbalance    secondary to back issues  . Multiple gastric ulcers   . Numbness and tingling of both legs   . Pneumonia   . Recurrent falls     Past Surgical History:  Procedure Laterality Date  . bicep tear     right lower arm related to overgrowth of bone as stated per pt   . KNEE ARTHROSCOPY     bilat  . lithrotripsy     for kidney stones  . LUMBAR LAMINECTOMY/DECOMPRESSION MICRODISCECTOMY N/A 09/07/2015   Procedure: MICRO LUMBAR DECOMPRESSION L4 - L5, REMOVAL OF SYNOVIAL CYST L4-L5;  Surgeon: Jene Every, MD;  Location: [facility];  Service: Orthopedics;  Laterality: N/A;  . URETERAL STENT PLACEMENT     secondary to kidney stones/pt states had laser surgery prior     Social History   Socioeconomic History  . Marital status: Married    Spouse name: Not on file  . Number of children: Not on file  . Years of education: Not on file  . Highest education level: Not on file  Occupational  History  . Not on file  Tobacco Use  . Smoking status: Never Smoker  . Smokeless tobacco: Never Used  Substance and Sexual Activity  . Alcohol use: No  . Drug use: No  . Sexual activity: Not on file  Other Topics Concern  . Not on file  Social History Narrative  . Not on file   Social Determinants of Health   Financial Resource Strain:   . Difficulty of Paying Living Expenses: Not on file  Food Insecurity:   . Worried About Programme researcher, broadcasting/film/video in the Last Year: Not on file  . Ran Out of Food in the Last Year: Not on file  Transportation Needs:   . Lack of Transportation (Medical): Not on file  . Lack of Transportation (Non-Medical): Not on file  Physical Activity:   . Days of Exercise per Week: Not on file  . Minutes of Exercise per Session: Not on file  Stress:   . Feeling of Stress : Not on file  Social Connections:   . Frequency of Communication with Friends and Family: Not on file  . Frequency of Social Gatherings with Friends and Family: Not on file  . Attends Religious Services: Not on file  . Active Member of Clubs or Organizations: Not on file  .  Attends Archivist Meetings: Not on file  . Marital Status: Not on file  Intimate Partner Violence:   . Fear of Current or Ex-Partner: Not on file  . Emotionally Abused: Not on file  . Physically Abused: Not on file  . Sexually Abused: Not on file    Current Outpatient Medications on File Prior to Visit  Medication Sig Dispense Refill  . allopurinol (ZYLOPRIM) 300 MG tablet Take 300 mg by mouth daily.     Marland Kitchen atorvastatin (LIPITOR) 40 MG tablet Take 1 tablet (40 mg total) by mouth daily. 90 tablet 3  . colesevelam (WELCHOL) 625 MG tablet Take 3 tablets (1,875 mg total) by mouth daily. 180 tablet 3  . Dupilumab (DUPIXENT) 300 MG/2ML SOSY Inject 300 mg into the skin every 14 (fourteen) days.    . empagliflozin (JARDIANCE) 25 MG TABS tablet Take 25 mg by mouth daily before breakfast. 90 tablet 3  . gabapentin  (NEURONTIN) 300 MG capsule Take 300 mg by mouth as needed.     Marland Kitchen glucose blood (ONETOUCH VERIO) test strip 1 each by Other route daily. Use as instructed 100 each 3  . metFORMIN (GLUCOPHAGE-XR) 500 MG 24 hr tablet TAKE 2 TABLETS BY MOUTH EVERY DAY WITH BREAKFAST 180 tablet 3  . oxyCODONE-acetaminophen (PERCOCET) 10-325 MG tablet Take 1 tablet by mouth every 4 (four) hours as needed for pain. 60 tablet 0  . PRESCRIPTION MEDICATION Apply 1 application topically 2 (two) times daily. Calcitriol ointment 43mcg/g. Verified with [facility] in [place].    . repaglinide (PRANDIN) 2 MG tablet Take 1 tablet (2 mg total) by mouth 3 (three) times daily before meals. 270 tablet 3  . testosterone cypionate (DEPOTESTOSTERONE CYPIONATE) 200 MG/ML injection Inject 200 mg into the muscle every 30 (thirty) days.  5   No current facility-administered medications on file prior to visit.    Allergies  Allergen Reactions  . Bromocriptine Other (See Comments)    GI issues  . Ciprofloxacin Other (See Comments)    "Tendon issues"  . Jardiance [Empagliflozin] Other (See Comments)    Frequent urination  . Trulicity [Dulaglutide] Other (See Comments)    GI issues  . Morphine And Related Other (See Comments)    Pt states does not work at relieving pain - states can not tell it works at all     Family History  Problem Relation Age of Onset  . Lung cancer Mother   . Lung cancer Father   . Diabetes Neg Hx     BP 116/60 (BP Location: Left Arm, Patient Position: Sitting, Cuff Size: Large)   Pulse 80   Ht 5\' 11"  (1.803 m)   Wt 277 lb 6.4 oz (125.8 kg)   SpO2 94%   BMI 38.69 kg/m    Review of Systems He denies hypoglycemia.      Objective:   Physical Exam VITAL SIGNS:  See vs page GENERAL: no distress Pulses: dorsalis pedis intact bilat.   MSK: no deformity of the feet CV: 1+ bilat leg edema, and bilat vv's Skin:  no ulcer on the feet.  normal color and temp on the feet. Neuro: sensation is intact to touch on  the feet Ext: there is bilateral onychomycosis of the toenails   Lab Results  Component Value Date   CREATININE 1.16 09/07/2019   BUN 20 09/07/2019   NA 137 09/07/2019   K 4.4 09/07/2019   CL 103 09/07/2019   CO2 24 09/07/2019   Lab Results  Component  Value Date   HGBA1C 7.7 (A) 12/10/2019       Assessment & Plan:  Type 2 DM, with renal insuff: worse Occupational status: he needs to control DM without insulin Edema: This limits rx options  Patient Instructions  I have sent a prescription to your pharmacy, to add "acarbose." Please continue the same other medications.   check your blood sugar once a day.  vary the time of day when you check, between before the 3 meals, and at bedtime.  also check if you have symptoms of your blood sugar being too high or too low.  please keep a record of the readings and bring it to your next appointment here (or you can bring the meter itself).  You can write it on any piece of paper.  please call us sooner if your blood sugar goes below 70, or if you have a lot of readings over 200. Please come back for a follow-up appointment in 2-3 months.

## 2019-12-10 NOTE — Patient Instructions (Addendum)
I have sent a prescription to your pharmacy, to add "acarbose." Please continue the same other medications.   check your blood sugar once a day.  vary the time of day when you check, between before the 3 meals, and at bedtime.  also check if you have symptoms of your blood sugar being too high or too low.  please keep a record of the readings and bring it to your next appointment here (or you can bring the meter itself).  You can write it on any piece of paper.  please call us sooner if your blood sugar goes below 70, or if you have a lot of readings over 200. Please come back for a follow-up appointment in 2-3 months.

## 2019-12-10 NOTE — Progress Notes (Signed)
Testosterone injection given per order, patient tolerated well. 

## 2020-01-07 ENCOUNTER — Other Ambulatory Visit: Payer: Self-pay

## 2020-01-07 ENCOUNTER — Ambulatory Visit (INDEPENDENT_AMBULATORY_CARE_PROVIDER_SITE_OTHER): Payer: 59

## 2020-01-07 DIAGNOSIS — E291 Testicular hypofunction: Secondary | ICD-10-CM | POA: Diagnosis not present

## 2020-01-21 ENCOUNTER — Other Ambulatory Visit: Payer: Self-pay | Admitting: Legal Medicine

## 2020-01-24 ENCOUNTER — Other Ambulatory Visit: Payer: Self-pay | Admitting: Endocrinology

## 2020-01-24 ENCOUNTER — Other Ambulatory Visit: Payer: Self-pay | Admitting: Legal Medicine

## 2020-01-25 ENCOUNTER — Other Ambulatory Visit: Payer: Self-pay | Admitting: Legal Medicine

## 2020-01-25 DIAGNOSIS — E782 Mixed hyperlipidemia: Secondary | ICD-10-CM

## 2020-02-01 ENCOUNTER — Ambulatory Visit: Payer: 59 | Admitting: Legal Medicine

## 2020-02-01 ENCOUNTER — Encounter: Payer: Self-pay | Admitting: Legal Medicine

## 2020-02-01 ENCOUNTER — Other Ambulatory Visit: Payer: Self-pay

## 2020-02-01 DIAGNOSIS — L400 Psoriasis vulgaris: Secondary | ICD-10-CM

## 2020-02-01 DIAGNOSIS — L03116 Cellulitis of left lower limb: Secondary | ICD-10-CM

## 2020-02-01 DIAGNOSIS — M10061 Idiopathic gout, right knee: Secondary | ICD-10-CM

## 2020-02-01 DIAGNOSIS — N400 Enlarged prostate without lower urinary tract symptoms: Secondary | ICD-10-CM

## 2020-02-01 HISTORY — DX: Benign prostatic hyperplasia without lower urinary tract symptoms: N40.0

## 2020-02-01 HISTORY — DX: Idiopathic gout, right knee: M10.061

## 2020-02-01 HISTORY — DX: Psoriasis vulgaris: L40.0

## 2020-02-01 MED ORDER — CEPHALEXIN 500 MG PO CAPS: 500.0000 mg | ORAL_CAPSULE | Freq: Four times a day (QID) | ORAL | 0 refills | Status: DC

## 2020-02-01 MED ORDER — PREDNISONE 50 MG PO TABS: ORAL_TABLET | ORAL | 0 refills | Status: DC

## 2020-02-01 NOTE — Progress Notes (Signed)
Acute Office Visit  Subjective:    Patient ID: Brent Duarte, male    DOB: August 29, 1956, 64 y.o.   MRN: [id number]  Chief Complaint  Patient presents with  . Ankle Pain    Sharp pain on left ankle since 4 days ago.    HPI Patient is in today for left foot pain.  It has redness and swelling for 2 weeks. He has a history of gout but using gout medicines and used colchicine without help.  It is tender medially and some redness in dorsum of foot medially. Patient does  Have a history of gout and has remained on allopurinol regularly.  Past Medical History:  Diagnosis Date  . Arthritis   . BPH without obstruction/lower urinary tract symptoms 02/01/2020  . Diabetes mellitus without complication (HCC)   . History of chicken pox   . History of kidney stones   . History of measles   . History of mumps   . Hyperlipidemia   . Hypertension   . Idiopathic gout of right knee 02/01/2020  . Imbalance    secondary to back issues  . Multiple gastric ulcers   . Numbness and tingling of both legs   . Pneumonia   . Psoriasis vulgaris 02/01/2020  . Recurrent falls     Past Surgical History:  Procedure Laterality Date  . bicep tear     right lower arm related to overgrowth of bone as stated per pt   . KNEE ARTHROSCOPY     bilat  . lithrotripsy     for kidney stones  . LUMBAR LAMINECTOMY/DECOMPRESSION MICRODISCECTOMY N/A 09/07/2015   Procedure: MICRO LUMBAR DECOMPRESSION L4 - L5, REMOVAL OF SYNOVIAL CYST L4-L5;  Surgeon: Jene Every, MD;  Location: [facility];  Service: Orthopedics;  Laterality: N/A;  . URETERAL STENT PLACEMENT     secondary to kidney stones/pt states had laser surgery prior     Family History  Problem Relation Age of Onset  . Lung cancer Mother   . Lung cancer Father   . Diabetes Neg Hx     Social History   Socioeconomic History  . Marital status: Married    Spouse name: Not on file  . Number of children: Not on file  . Years of education: Not on file  . Highest  education level: Not on file  Occupational History  . Not on file  Tobacco Use  . Smoking status: Never Smoker  . Smokeless tobacco: Never Used  Substance and Sexual Activity  . Alcohol use: No  . Drug use: No  . Sexual activity: Yes    Partners: Female  Other Topics Concern  . Not on file  Social History Narrative  . Not on file   Social Determinants of Health   Financial Resource Strain:   . Difficulty of Paying Living Expenses:   Food Insecurity:   . Worried About Programme researcher, broadcasting/film/video in the Last Year:   . Barista in the Last Year:   Transportation Needs:   . Freight forwarder (Medical):   Marland Kitchen Lack of Transportation (Non-Medical):   Physical Activity:   . Days of Exercise per Week:   . Minutes of Exercise per Session:   Stress:   . Feeling of Stress :   Social Connections:   . Frequency of Communication with Friends and Family:   . Frequency of Social Gatherings with Friends and Family:   . Attends Religious Services:   . Active Member of  Clubs or Organizations:   . Attends Archivist Meetings:   Marland Kitchen Marital Status:   Intimate Partner Violence:   . Fear of Current or Ex-Partner:   . Emotionally Abused:   Marland Kitchen Physically Abused:   . Sexually Abused:     Outpatient Medications Prior to Visit  Medication Sig Dispense Refill  . acarbose (PRECOSE) 25 MG tablet Take 0.5 tablets (12.5 mg total) by mouth 3 (three) times daily with meals. 45 tablet 3  . allopurinol (ZYLOPRIM) 300 MG tablet TAKE 1 TABLET BY MOUTH AT BEDTIME 90 tablet 2  . atorvastatin (LIPITOR) 40 MG tablet Take 1 tablet (40 mg total) by mouth daily. 90 tablet 3  . colesevelam (WELCHOL) 625 MG tablet Take 3 tablets (1,875 mg total) by mouth daily. 180 tablet 3  . Dupilumab (DUPIXENT) 300 MG/2ML SOSY Inject 300 mg into the skin every 14 (fourteen) days.    . empagliflozin (JARDIANCE) 25 MG TABS tablet Take 25 mg by mouth daily before breakfast. 90 tablet 3  . gabapentin (NEURONTIN) 300 MG  capsule Take 300 mg by mouth as needed.     Marland Kitchen glucose blood (ONETOUCH VERIO) test strip 1 each by Other route daily. Use as instructed 100 each 3  . metFORMIN (GLUCOPHAGE-XR) 500 MG 24 hr tablet TAKE 2 TABLETS BY MOUTH EVERY DAY WITH BREAKFAST 180 tablet 3  . omega-3 acid ethyl esters (LOVAZA) 1 g capsule TAKE 2 CAPSULES BY MOUTH TWICE A DAY 360 capsule 2  . oxyCODONE-acetaminophen (PERCOCET) 10-325 MG tablet Take 1 tablet by mouth every 4 (four) hours as needed for pain. 60 tablet 0  . PRESCRIPTION MEDICATION Apply 1 application topically 2 (two) times daily. Calcitriol ointment 67mcg/g. [facility] [place].    . repaglinide (PRANDIN) 2 MG tablet TAKE 1 TABLET (2 MG TOTAL) BY MOUTH 3 (THREE) TIMES DAILY BEFORE MEALS. 270 tablet 3  . testosterone cypionate (DEPOTESTOSTERONE CYPIONATE) 200 MG/ML injection INJECT 1ML INTRAMUSCULARLY ONCE A MONTH 3 mL 3  . testosterone cypionate (DEPOTESTOSTERONE CYPIONATE) injection 200 mg      No facility-administered medications prior to visit.    Allergies  Allergen Reactions  . Bromocriptine Other (See Comments)    GI issues  . Ciprofloxacin Other (See Comments)    "Tendon issues"  . Jardiance [Empagliflozin] Other (See Comments)    Frequent urination  . Trulicity [Dulaglutide] Other (See Comments)    GI issues  . Morphine And Related Other (See Comments)    Pt states does not work at relieving pain - states can not tell it works at all     Review of Systems  Constitutional: Negative.   HENT: Negative.   Eyes: Negative.   Respiratory: Negative.   Cardiovascular: Negative.   Musculoskeletal: Positive for arthralgias.       Left ankle pain       Objective:    Physical Exam Vitals reviewed.  Constitutional:      Appearance: Normal appearance.  HENT:     Head: Normocephalic and atraumatic.  Cardiovascular:     Rate and Rhythm: Normal rate and regular rhythm.     Heart sounds: Normal heart sounds.  Pulmonary:     Effort:  Pulmonary effort is normal.  Musculoskeletal:        General: Tenderness present.     Comments: Pain medial aspect left ankle radiating to forefoot.  There is redness and warmth.  Neurological:     Mental Status: He is alert.     BP  120/70 (BP Location: Right Arm, Patient Position: Sitting)   Pulse 78   Temp (!) 97.3 F (36.3 C) (Temporal)   Resp 17   Ht 5\' 11"  (1.803 m)   Wt 277 lb (125.6 kg)   BMI 38.63 kg/m  Wt Readings from Last 3 Encounters:  02/01/20 277 lb (125.6 kg)  12/10/19 277 lb 6.4 oz (125.8 kg)  09/07/19 283 lb 12.8 oz (128.7 kg)    Health Maintenance Due  Topic Date Due  . Hepatitis C Screening  Never done  . OPHTHALMOLOGY EXAM  Never done  . URINE MICROALBUMIN  Never done  . HIV Screening  Never done  . COVID-19 Vaccine (1) Never done  . TETANUS/TDAP  Never done  . COLONOSCOPY  Never done  . FOOT EXAM  11/05/2019    There are no preventive care reminders to display for this patient.   Lab Results  Component Value Date   TSH 1.89 09/07/2019   Lab Results  Component Value Date   WBC 6.1 08/31/2015   HGB 14.3 08/31/2015   HCT 41.4 08/31/2015   MCV 90.6 08/31/2015   PLT 172 08/31/2015   Lab Results  Component Value Date   NA 137 09/07/2019   K 4.4 09/07/2019   CO2 24 09/07/2019   GLUCOSE 189 (H) 09/07/2019   BUN 20 09/07/2019   CREATININE 1.16 09/07/2019   CALCIUM 9.5 09/07/2019   ANIONGAP 7 08/31/2015   GFR 63.40 09/07/2019   No results found for: CHOL No results found for: HDL No results found for: LDLCALC No results found for: TRIG No results found for: CHOLHDL Lab Results  Component Value Date   HGBA1C 7.7 (A) 12/10/2019       Assessment & Plan:   Problem List Items Addressed This Visit      Other   Cellulitis of left ankle    Patient has tender left medial malleolus and to forefoot with redness.  It has not responded to colchicine.  We will get X-ray to rule out injury.  Keflex for cellulitis and prednisone for possible  gout.  Follow up one week.  ACE bandage left ankle.      Relevant Medications   cephALEXin (KEFLEX) 500 MG capsule   Other Relevant Orders   DG Ankle Complete Left     X-ray ankle shows on mild osteoartitis.  Meds ordered this encounter  Medications  . cephALEXin (KEFLEX) 500 MG capsule    Sig: Take 1 capsule (500 mg total) by mouth 4 (four) times daily.    Dispense:  40 capsule    Refill:  0  . predniSONE (DELTASONE) 50 MG tablet    Sig: One pail a day for 5 days    Dispense:  5 tablet    Refill:  0     12/12/2019, MD

## 2020-02-01 NOTE — Assessment & Plan Note (Signed)
Patient has tender left medial malleolus and to forefoot with redness.  It has not responded to colchicine.  We will get X-ray to rule out injury.  Keflex for cellulitis and prednisone for possible gout.  Follow up one week.  ACE bandage left ankle.

## 2020-02-04 ENCOUNTER — Other Ambulatory Visit: Payer: Self-pay | Admitting: Legal Medicine

## 2020-02-04 ENCOUNTER — Telehealth: Payer: Self-pay

## 2020-02-04 MED ORDER — CLINDAMYCIN HCL 150 MG PO CAPS: 150.0000 mg | ORAL_CAPSULE | Freq: Three times a day (TID) | ORAL | 0 refills | Status: DC

## 2020-02-04 NOTE — Telephone Encounter (Signed)
Patient called in and states he cant tolorate cephalexan, it is causing diarrhea and bloating and gas, He was taking for his ankle. Do you want to try another medication?

## 2020-02-04 NOTE — Telephone Encounter (Signed)
Stop keflex, I changed to clindamycin- sent in lp

## 2020-02-08 ENCOUNTER — Ambulatory Visit: Payer: 59 | Admitting: Legal Medicine

## 2020-02-08 ENCOUNTER — Encounter: Payer: Self-pay | Admitting: Legal Medicine

## 2020-02-08 ENCOUNTER — Other Ambulatory Visit: Payer: Self-pay

## 2020-02-08 VITALS — BP 106/60 | HR 76 | Temp 97.5°F | Resp 18 | Ht 71.0 in | Wt 274.8 lb

## 2020-02-08 DIAGNOSIS — M65972 Unspecified synovitis and tenosynovitis, left ankle and foot: Secondary | ICD-10-CM | POA: Insufficient documentation

## 2020-02-08 DIAGNOSIS — E291 Testicular hypofunction: Secondary | ICD-10-CM | POA: Insufficient documentation

## 2020-02-08 DIAGNOSIS — M659 Synovitis and tenosynovitis, unspecified: Secondary | ICD-10-CM | POA: Diagnosis not present

## 2020-02-08 MED ORDER — PREDNISONE 10 MG (21) PO TBPK: ORAL_TABLET | ORAL | 0 refills | Status: DC

## 2020-02-08 MED ORDER — TESTOSTERONE CYPIONATE 200 MG/ML IM SOLN
200.0000 mg | Freq: Once | INTRAMUSCULAR | Status: AC
  Administered 2020-02-08: 200 mg via INTRAMUSCULAR

## 2020-02-08 NOTE — Progress Notes (Signed)
Established Patient Office Visit  Subjective:  Patient ID: Brent Duarte, male    DOB: 1956/07/17  Age: 64 y.o. MRN: [id number]  CC:  Chief Complaint  Patient presents with  . Cellulitis    Left Ankle    HPI TYCEN DOCKTER presents for left ankle remains sore.  X-ray showed some OA medially and swelling on tissue.  Past Medical History:  Diagnosis Date  . Arthritis   . BPH without obstruction/lower urinary tract symptoms 02/01/2020  . Diabetes mellitus without complication ([facility])   . History of chicken pox   . History of kidney stones   . History of measles   . History of mumps   . Hyperlipidemia   . Hypertension   . Idiopathic gout of right knee 02/01/2020  . Imbalance    secondary to back issues  . Multiple gastric ulcers   . Numbness and tingling of both legs   . Pneumonia   . Psoriasis vulgaris 02/01/2020  . Recurrent falls     Past Surgical History:  Procedure Laterality Date  . bicep tear     right lower arm related to overgrowth of bone as stated per pt   . KNEE ARTHROSCOPY     bilat  . lithrotripsy     for kidney stones  . LUMBAR LAMINECTOMY/DECOMPRESSION MICRODISCECTOMY N/A 09/07/2015   Procedure: MICRO LUMBAR DECOMPRESSION L4 - L5, REMOVAL OF SYNOVIAL CYST L4-L5;  Surgeon: Susa Day, MD;  Location: [facility];  Service: Orthopedics;  Laterality: N/A;  . URETERAL STENT PLACEMENT     secondary to kidney stones/pt states had laser surgery prior     Family History  Problem Relation Age of Onset  . Lung cancer Mother   . Lung cancer Father   . Diabetes Neg Hx     Social History   Socioeconomic History  . Marital status: Married    Spouse name: Not on file  . Number of children: Not on file  . Years of education: Not on file  . Highest education level: Not on file  Occupational History  . Not on file  Tobacco Use  . Smoking status: Never Smoker  . Smokeless tobacco: Never Used  Substance and Sexual Activity  . Alcohol use: No  . Drug use: No    . Sexual activity: Yes    Partners: Female  Other Topics Concern  . Not on file  Social History Narrative  . Not on file   Social Determinants of Health   Financial Resource Strain:   . Difficulty of Paying Living Expenses:   Food Insecurity:   . Worried About Charity fundraiser in the Last Year:   . Arboriculturist in the Last Year:   Transportation Needs:   . Film/video editor (Medical):   Marland Kitchen Lack of Transportation (Non-Medical):   Physical Activity:   . Days of Exercise per Week:   . Minutes of Exercise per Session:   Stress:   . Feeling of Stress :   Social Connections:   . Frequency of Communication with Friends and Family:   . Frequency of Social Gatherings with Friends and Family:   . Attends Religious Services:   . Active Member of Clubs or Organizations:   . Attends Archivist Meetings:   Marland Kitchen Marital Status:   Intimate Partner Violence:   . Fear of Current or Ex-Partner:   . Emotionally Abused:   Marland Kitchen Physically Abused:   . Sexually Abused:  Outpatient Medications Prior to Visit  Medication Sig Dispense Refill  . acarbose (PRECOSE) 25 MG tablet Take 0.5 tablets (12.5 mg total) by mouth 3 (three) times daily with meals. 45 tablet 3  . allopurinol (ZYLOPRIM) 300 MG tablet TAKE 1 TABLET BY MOUTH AT BEDTIME 90 tablet 2  . atorvastatin (LIPITOR) 40 MG tablet Take 1 tablet (40 mg total) by mouth daily. 90 tablet 3  . cephALEXin (KEFLEX) 500 MG capsule Take 1 capsule (500 mg total) by mouth 4 (four) times daily. 40 capsule 0  . clindamycin (CLEOCIN) 150 MG capsule Take 1 capsule (150 mg total) by mouth 3 (three) times daily. 30 capsule 0  . colesevelam (WELCHOL) 625 MG tablet Take 3 tablets (1,875 mg total) by mouth daily. 180 tablet 3  . Dupilumab (DUPIXENT) 300 MG/2ML SOSY Inject 300 mg into the skin every 14 (fourteen) days.    . empagliflozin (JARDIANCE) 25 MG TABS tablet Take 25 mg by mouth daily before breakfast. 90 tablet 3  . gabapentin  (NEURONTIN) 300 MG capsule Take 300 mg by mouth as needed.     Marland Kitchen glucose blood (ONETOUCH VERIO) test strip 1 each by Other route daily. Use as instructed 100 each 3  . metFORMIN (GLUCOPHAGE-XR) 500 MG 24 hr tablet TAKE 2 TABLETS BY MOUTH EVERY DAY WITH BREAKFAST 180 tablet 3  . omega-3 acid ethyl esters (LOVAZA) 1 g capsule TAKE 2 CAPSULES BY MOUTH TWICE A DAY 360 capsule 2  . oxyCODONE-acetaminophen (PERCOCET) 10-325 MG tablet Take 1 tablet by mouth every 4 (four) hours as needed for pain. 60 tablet 0  . PRESCRIPTION MEDICATION Apply 1 application topically 2 (two) times daily. Calcitriol ointment 66mcg/g. Verified with [facility] in [place].    . repaglinide (PRANDIN) 2 MG tablet TAKE 1 TABLET (2 MG TOTAL) BY MOUTH 3 (THREE) TIMES DAILY BEFORE MEALS. 270 tablet 3  . testosterone cypionate (DEPOTESTOSTERONE CYPIONATE) 200 MG/ML injection INJECT INTRAMUSCULARLY ONCE A MONTH 3 mL 3  . predniSONE (DELTASONE) 50 MG tablet One pail a day for 5 days 5 tablet 0   No facility-administered medications prior to visit.    Allergies  Allergen Reactions  . Bromocriptine Other (See Comments)    GI issues  . Ciprofloxacin Other (See Comments)    "Tendon issues"  . Jardiance [Empagliflozin] Other (See Comments)    Frequent urination  . Trulicity [Dulaglutide] Other (See Comments)    GI issues  . Morphine And Related Other (See Comments)    Pt states does not work at relieving pain - states can not tell it works at all     ROS Review of Systems  Constitutional: Negative.   HENT: Negative.   Eyes: Negative.   Respiratory: Negative.   Cardiovascular: Negative.   Gastrointestinal: Negative.   Genitourinary: Negative.   Musculoskeletal: Positive for arthralgias.  Skin: Negative.   Neurological: Negative.   Psychiatric/Behavioral: Negative.       Objective:    Physical Exam  Constitutional: He appears well-developed and well-nourished.  Cardiovascular: Normal rate, regular rhythm, normal heart  sounds and intact distal pulses.  Pulmonary/Chest: Effort normal and breath sounds normal.  Musculoskeletal:        General: Normal range of motion.     Comments: Left ankle tender over synovia, no localization over tendons, full ROM ankle  Vitals reviewed.   BP 106/60   Pulse 76   Temp (!) 97.5 F (36.4 C)   Resp 18   Ht 5\' 11"  (1.803 m)  Wt 274 lb 12.8 oz (124.6 kg)   SpO2 98%   BMI 38.33 kg/m  Wt Readings from Last 3 Encounters:  02/08/20 274 lb 12.8 oz (124.6 kg)  02/01/20 277 lb (125.6 kg)  12/10/19 277 lb 6.4 oz (125.8 kg)     Health Maintenance Due  Topic Date Due  . Hepatitis C Screening  Never done  . OPHTHALMOLOGY EXAM  Never done  . URINE MICROALBUMIN  Never done  . HIV Screening  Never done  . COVID-19 Vaccine (1) Never done  . TETANUS/TDAP  Never done  . COLONOSCOPY  Never done  . FOOT EXAM  11/05/2019    There are no preventive care reminders to display for this patient.  Lab Results  Component Value Date   TSH 1.89 09/07/2019   Lab Results  Component Value Date   WBC 6.1 08/31/2015   HGB 14.3 08/31/2015   HCT 41.4 08/31/2015   MCV 90.6 08/31/2015   PLT 172 08/31/2015   Lab Results  Component Value Date   NA 137 09/07/2019   K 4.4 09/07/2019   CO2 24 09/07/2019   GLUCOSE 189 (H) 09/07/2019   BUN 20 09/07/2019   CREATININE 1.16 09/07/2019   CALCIUM 9.5 09/07/2019   ANIONGAP 7 08/31/2015   GFR 63.40 09/07/2019   No results found for: CHOL No results found for: HDL No results found for: LDLCALC No results found for: TRIG No results found for: CHOLHDL Lab Results  Component Value Date   HGBA1C 7.7 (A) 12/10/2019      Assessment & Plan:   Problem List Items Addressed This Visit      Endocrine   Hypogonadism in male     Musculoskeletal and Integument   Synovitis of left ankle - Primary    Patient still has pain that is improved with wrapping.  Mild osteoarthritis.  Try prednisone to help inflammation.  If not improving, refer  to orthopedics.      Relevant Medications   predniSONE (STERAPRED UNI-PAK 21 TAB) 10 MG (21) TBPK tablet      Meds ordered this encounter  Medications  . predniSONE (STERAPRED UNI-PAK 21 TAB) 10 MG (21) TBPK tablet    Sig: Prednisone 6 pills first day, then 5 pills second day, then taper by one a day until gone    Dispense:  21 tablet    Refill:  0  . testosterone cypionate (DEPOTESTOSTERONE CYPIONATE) injection 200 mg    Follow-up: Return in about 2 weeks (around 02/22/2020) for for left ankle.    Brent Bulla, MD

## 2020-02-08 NOTE — Patient Instructions (Signed)
Ankle Exercises Ask your health care provider which exercises are safe for you. Do exercises exactly as told by your health care provider and adjust them as directed. It is normal to feel mild stretching, pulling, tightness, or mild discomfort as you do these exercises. Stop right away if you feel sudden pain or your pain gets worse. Do not begin these exercises until told by your health care provider. Stretching and range-of-motion exercises These exercises warm up your muscles and joints and improve the movement and flexibility of your ankle. These exercises may also help to relieve pain. Dorsiflexion/plantar flexion  1. Sit with your __________ knee straight or bent. Do not rest your foot on anything. 2. Flex your __________ ankle to tilt the top of your foot toward your shin. This is called dorsiflexion. 3. Hold this position for __________ seconds. 4. Point your toes downward to tilt the top of your foot away from your shin. This is called plantar flexion. 5. Hold this position for __________ seconds. Repeat __________ times. Complete this exercise __________ times a day. Ankle alphabet  1. Sit with your __________ foot supported at your lower leg. ? Do not rest your foot on anything. ? Make sure your foot has room to move freely. 2. Think of your __________ foot as a paintbrush: ? Move your foot to trace each letter of the alphabet in the air. Keep your hip and knee still while you trace the letters. Trace every letter from A to Z. ? Make the letters as large as you can without causing or increasing any discomfort. Repeat __________ times. Complete this exercise __________ times a day. Passive ankle dorsiflexion This is an exercise in which something or someone moves your ankle for you. You do not move it yourself. 1. Sit on a chair that is placed on a non-carpeted surface. 2. Place your __________ foot on the floor, directly under your __________ knee. Extend your __________ leg for  support. 3. Keeping your heel down, slide your __________ foot back toward the chair until you feel a stretch at your ankle or calf. If you do not feel a stretch, slide your buttocks forward to the edge of the chair while keeping your heel down. 4. Hold this stretch for __________ seconds. Repeat __________ times. Complete this exercise __________ times a day. Strengthening exercises These exercises build strength and endurance in your ankle. Endurance is the ability to use your muscles for a long time, even after they get tired. Dorsiflexors These are muscles that lift your foot up. 1. Secure a rubber exercise band or tube to an object, such as a table leg, that will stay still when the band is pulled. Secure the other end around your __________ foot. 2. Sit on the floor, facing the object with your __________ leg extended. The band or tube should be slightly tense when your foot is relaxed. 3. Slowly flex your __________ ankle and toes to bring your foot toward your shin. 4. Hold this position for __________ seconds. 5. Slowly return your foot to the starting position, controlling the band as you do that. Repeat __________ times. Complete this exercise __________ times a day. Plantar flexors These are muscles that push your foot down. 1. Sit on the floor with your __________ leg extended. 2. Loop a rubber exercise band or tube around the ball of your __________ foot. The ball of your foot is on the walking surface, right under your toes. The band or tube should be slightly tense when your   foot is relaxed. 3. Slowly point your toes downward, pushing them away from you. 4. Hold this position for __________ seconds. 5. Slowly release the tension in the band or tube, controlling smoothly until your foot is back in the starting position. Repeat __________ times. Complete this exercise __________ times a day. Towel curls  1. Sit in a chair on a non-carpeted surface, and put your feet on the  floor. 2. Place a towel in front of your feet. If told by your health care provider, add a __________ pound weight to the end of the towel. 3. Keeping your heel on the floor, put your __________ foot on the towel. 4. Pull the towel toward you by grabbing the towel with your toes and curling them under. Keep your heel on the floor. 5. Let your toes relax. 6. Grab the towel again. Keep pulling the towel until it is completely underneath your foot. Repeat __________ times. Complete this exercise __________ times a day. Standing plantar flexion This is an exercise in which you use your toes to lift your body's weight while standing. 1. Stand with your feet shoulder-width apart. 2. Keep your weight spread evenly over the width of your feet while you rise up on your toes. Use a wall or table to steady yourself if needed, but try not to use it for support. 3. If this exercise is too easy, try these options: ? Shift your weight toward your __________ leg until you feel challenged. ? If told by your health care provider, lift your uninjured leg off the floor. 4. Hold this position for __________ seconds. Repeat __________ times. Complete this exercise __________ times a day. Tandem walking 1. Stand with one foot directly in front of the other. 2. Slowly raise your back foot up, lifting your heel before your toes, and place it directly in front of your other foot. 3. Continue to walk in this heel-to-toe way for __________ or for as long as told by your health care provider. Have a countertop or wall nearby to use if needed to keep your balance, but try not to hold onto anything for support. Repeat __________ times. Complete this exercise __________ times a day. This information is not intended to replace advice given to you by your health care provider. Make sure you discuss any questions you have with your health care provider. Document Revised: 06/28/2018 Document Reviewed: 06/30/2018 Elsevier Patient  Education  2020 [employer]  

## 2020-02-08 NOTE — Assessment & Plan Note (Signed)
Patient still has pain that is improved with wrapping.  Mild osteoarthritis.  Try prednisone to help inflammation.  If not improving, refer to orthopedics.

## 2020-02-12 ENCOUNTER — Other Ambulatory Visit: Payer: Self-pay

## 2020-02-16 ENCOUNTER — Ambulatory Visit: Payer: 59 | Admitting: Endocrinology

## 2020-02-16 ENCOUNTER — Encounter: Payer: Self-pay | Admitting: Endocrinology

## 2020-02-16 ENCOUNTER — Other Ambulatory Visit: Payer: Self-pay

## 2020-02-16 VITALS — BP 110/70 | HR 85 | Ht 71.0 in | Wt 274.0 lb

## 2020-02-16 DIAGNOSIS — E1121 Type 2 diabetes mellitus with diabetic nephropathy: Secondary | ICD-10-CM | POA: Diagnosis not present

## 2020-02-16 DIAGNOSIS — N1831 Chronic kidney disease, stage 3a: Secondary | ICD-10-CM

## 2020-02-16 LAB — POCT GLYCOSYLATED HEMOGLOBIN (HGB A1C): Hemoglobin A1C: 8 % — AB (ref 4.0–5.6)

## 2020-02-16 MED ORDER — ACARBOSE 25 MG PO TABS: 25.0000 mg | ORAL_TABLET | Freq: Three times a day (TID) | ORAL | 3 refills | Status: DC

## 2020-02-16 NOTE — Progress Notes (Signed)
Subjective:    Patient ID: Brent Duarte, male    DOB: 1956-04-13, 64 y.o.   MRN: [id number]  HPI Pt returns for f/u of diabetes mellitus: DM type: 2 Dx'ed: 2017 Complications: PN and renal insuff Therapy: 4 oral meds DKA: never Severe hypoglycemia: never Pancreatitis: once, in 2018 Pancreatic imaging: none available.  SDOH: He needs to control DM without insulin, due to CDL Other: he has never been on insulin; He had pancreatitis with trulicity; edema limits rx options; CRI limits rx options and dosages; he did not tolerate bromocriptine (n/v).  Interval history: no cbg record, but states cbg's vary from 128-250.  It is in general higher as the day goes on  pt states he feels well in general.  No recent steroids.  Past Medical History:  Diagnosis Date  . Arthritis   . BPH without obstruction/lower urinary tract symptoms 02/01/2020  . Diabetes mellitus without complication ([facility])   . History of chicken pox   . History of kidney stones   . History of measles   . History of mumps   . Hyperlipidemia   . Hypertension   . Idiopathic gout of right knee 02/01/2020  . Imbalance    secondary to back issues  . Multiple gastric ulcers   . Numbness and tingling of both legs   . Pneumonia   . Psoriasis vulgaris 02/01/2020  . Recurrent falls     Past Surgical History:  Procedure Laterality Date  . bicep tear     right lower arm related to overgrowth of bone as stated per pt   . KNEE ARTHROSCOPY     bilat  . lithrotripsy     for kidney stones  . LUMBAR LAMINECTOMY/DECOMPRESSION MICRODISCECTOMY N/A 09/07/2015   Procedure: MICRO LUMBAR DECOMPRESSION L4 - L5, REMOVAL OF SYNOVIAL CYST L4-L5;  Surgeon: Jene Every, MD;  Location: [facility];  Service: Orthopedics;  Laterality: N/A;  . URETERAL STENT PLACEMENT     secondary to kidney stones/pt states had laser surgery prior     Social History   Socioeconomic History  . Marital status: Married    Spouse name: Not on file  . Number of  children: Not on file  . Years of education: Not on file  . Highest education level: Not on file  Occupational History  . Not on file  Tobacco Use  . Smoking status: Never Smoker  . Smokeless tobacco: Never Used  Substance and Sexual Activity  . Alcohol use: No  . Drug use: No  . Sexual activity: Yes    Partners: Female  Other Topics Concern  . Not on file  Social History Narrative  . Not on file   Social Determinants of Health   Financial Resource Strain:   . Difficulty of Paying Living Expenses:   Food Insecurity:   . Worried About Programme researcher, broadcasting/film/video in the Last Year:   . Barista in the Last Year:   Transportation Needs:   . Freight forwarder (Medical):   Marland Kitchen Lack of Transportation (Non-Medical):   Physical Activity:   . Days of Exercise per Week:   . Minutes of Exercise per Session:   Stress:   . Feeling of Stress :   Social Connections:   . Frequency of Communication with Friends and Family:   . Frequency of Social Gatherings with Friends and Family:   . Attends Religious Services:   . Active Member of Clubs or Organizations:   . Attends Club  or Organization Meetings:   Marland Kitchen Marital Status:   Intimate Partner Violence:   . Fear of Current or Ex-Partner:   . Emotionally Abused:   Marland Kitchen Physically Abused:   . Sexually Abused:     Current Outpatient Medications on File Prior to Visit  Medication Sig Dispense Refill  . allopurinol (ZYLOPRIM) 300 MG tablet TAKE 1 TABLET BY MOUTH AT BEDTIME 90 tablet 2  . atorvastatin (LIPITOR) 40 MG tablet Take 1 tablet (40 mg total) by mouth daily. 90 tablet 3  . colesevelam (WELCHOL) 625 MG tablet Take 3 tablets (1,875 mg total) by mouth daily. 180 tablet 3  . Dupilumab (DUPIXENT) 300 MG/2ML SOSY Inject 300 mg into the skin every 14 (fourteen) days.    . empagliflozin (JARDIANCE) 25 MG TABS tablet Take 25 mg by mouth daily before breakfast. 90 tablet 3  . gabapentin (NEURONTIN) 300 MG capsule Take 300 mg by mouth as needed.      Marland Kitchen glucose blood (ONETOUCH VERIO) test strip 1 each by Other route daily. Use as instructed 100 each 3  . metFORMIN (GLUCOPHAGE-XR) 500 MG 24 hr tablet TAKE 2 TABLETS BY MOUTH EVERY DAY WITH BREAKFAST 180 tablet 3  . omega-3 acid ethyl esters (LOVAZA) 1 g capsule TAKE 2 CAPSULES BY MOUTH TWICE A DAY 360 capsule 2  . oxyCODONE-acetaminophen (PERCOCET) 10-325 MG tablet Take 1 tablet by mouth every 4 (four) hours as needed for pain. 60 tablet 0  . PRESCRIPTION MEDICATION Apply 1 application topically 2 (two) times daily. Calcitriol ointment 107mcg/g. [facility] [place].    . repaglinide (PRANDIN) 2 MG tablet TAKE 1 TABLET (2 MG TOTAL) BY MOUTH 3 (THREE) TIMES DAILY BEFORE MEALS. 270 tablet 3  . testosterone cypionate (DEPOTESTOSTERONE CYPIONATE) 200 MG/ML injection INJECT 1ML INTRAMUSCULARLY ONCE A MONTH 3 mL 3   No current facility-administered medications on file prior to visit.    Allergies  Allergen Reactions  . Bromocriptine Other (See Comments)    GI issues  . Ciprofloxacin Other (See Comments)    "Tendon issues"  . Jardiance [Empagliflozin] Other (See Comments)    Frequent urination  . Trulicity [Dulaglutide] Other (See Comments)    GI issues  . Morphine And Related Other (See Comments)    Pt states does not work at relieving pain - states can not tell it works at all     Family History  Problem Relation Age of Onset  . Lung cancer Mother   . Lung cancer Father   . Diabetes Neg Hx     BP 110/70   Pulse 85   Ht 5\' 11"  (1.803 m)   Wt 274 lb (124.3 kg)   SpO2 98%   BMI 38.22 kg/m    Review of Systems He denies hypoglycemia and diarrhea.     Objective:   Physical Exam VITAL SIGNS:  See vs page GENERAL: no distress Pulses: dorsalis pedis intact bilat.   MSK: no deformity of the feet CV: 1+ bilat leg edema, and bilat vv's Skin:  no ulcer on the feet.  normal color and temp on the feet. Neuro: sensation is intact to touch on the feet, but decreased from  normal Ext: there is bilateral onychomycosis of the toenails.   Lab Results  Component Value Date   HGBA1C 8.0 (A) 02/16/2020       Assessment & Plan:  Type 2 DM, with PN: worse   Patient Instructions  I have sent a prescription to your pharmacy, to double the  acarbose. Please continue the same other medications.   check your blood sugar once a day.  vary the time of day when you check, between before the 3 meals, and at bedtime.  also check if you have symptoms of your blood sugar being too high or too low.  please keep a record of the readings and bring it to your next appointment here (or you can bring the meter itself).  You can write it on any piece of paper.  please call us sooner if your blood sugar goes below 70, or if you have a lot of readings over 200. Please come back for a follow-up appointment in 2 months.

## 2020-02-16 NOTE — Patient Instructions (Addendum)
I have sent a prescription to your pharmacy, to double the acarbose. Please continue the same other medications.   check your blood sugar once a day.  vary the time of day when you check, between before the 3 meals, and at bedtime.  also check if you have symptoms of your blood sugar being too high or too low.  please keep a record of the readings and bring it to your next appointment here (or you can bring the meter itself).  You can write it on any piece of paper.  please call us sooner if your blood sugar goes below 70, or if you have a lot of readings over 200. Please come back for a follow-up appointment in 2 months.

## 2020-02-22 ENCOUNTER — Ambulatory Visit: Payer: 59 | Admitting: Legal Medicine

## 2020-02-22 ENCOUNTER — Encounter: Payer: Self-pay | Admitting: Legal Medicine

## 2020-02-22 ENCOUNTER — Other Ambulatory Visit: Payer: Self-pay

## 2020-02-22 DIAGNOSIS — M659 Synovitis and tenosynovitis, unspecified: Secondary | ICD-10-CM | POA: Diagnosis not present

## 2020-02-22 NOTE — Assessment & Plan Note (Signed)
The left ankle is improved but still some pain over osteophye.  May need orthopedics if not helping.

## 2020-02-22 NOTE — Progress Notes (Signed)
Established Patient Office Visit  Subjective:  Patient ID: Brent Duarte, male    DOB: 1956-08-25  Age: 64 y.o. MRN: [id number]  CC:  Chief Complaint  Patient presents with  . Synovitis of left ankle    HPI Brent Duarte presents for Synovitis left ankle. Still has some pain but using brace.Full ROM ankle.  Some pain anterior to left ankle medial malleolus.  No pain over joint now.  Past Medical History:  Diagnosis Date  . Arthritis   . BPH without obstruction/lower urinary tract symptoms 02/01/2020  . Diabetes mellitus without complication ([place])   . History of chicken pox   . History of kidney stones   . History of measles   . History of mumps   . Hyperlipidemia   . Hypertension   . Idiopathic gout of right knee 02/01/2020  . Imbalance    secondary to back issues  . Multiple gastric ulcers   . Numbness and tingling of both legs   . Pneumonia   . Psoriasis vulgaris 02/01/2020  . Recurrent falls     Past Surgical History:  Procedure Laterality Date  . bicep tear     right lower arm related to overgrowth of bone as stated per pt   . KNEE ARTHROSCOPY     bilat  . lithrotripsy     for kidney stones  . LUMBAR LAMINECTOMY/DECOMPRESSION MICRODISCECTOMY N/A 09/07/2015   Procedure: MICRO LUMBAR DECOMPRESSION L4 - L5, REMOVAL OF SYNOVIAL CYST L4-L5;  Surgeon: Susa Day, MD;  Location: [facility];  Service: Orthopedics;  Laterality: N/A;  . URETERAL STENT PLACEMENT     secondary to kidney stones/pt states had laser surgery prior     Family History  Problem Relation Age of Onset  . Lung cancer Mother   . Lung cancer Father   . Diabetes Neg Hx     Social History   Socioeconomic History  . Marital status: Married    Spouse name: Not on file  . Number of children: Not on file  . Years of education: Not on file  . Highest education level: Not on file  Occupational History    Employer: [employer]   Tobacco Use  . Smoking status: Never Smoker  . Smokeless  tobacco: Never Used  Substance and Sexual Activity  . Alcohol use: No  . Drug use: No  . Sexual activity: Yes    Partners: Female  Other Topics Concern  . Not on file  Social History Narrative  . Not on file   Social Determinants of Health   Financial Resource Strain:   . Difficulty of Paying Living Expenses:   Food Insecurity:   . Worried About Charity fundraiser in the Last Year:   . Arboriculturist in the Last Year:   Transportation Needs:   . Film/video editor (Medical):   Marland Kitchen Lack of Transportation (Non-Medical):   Physical Activity:   . Days of Exercise per Week:   . Minutes of Exercise per Session:   Stress:   . Feeling of Stress :   Social Connections:   . Frequency of Communication with Friends and Family:   . Frequency of Social Gatherings with Friends and Family:   . Attends Religious Services:   . Active Member of Clubs or Organizations:   . Attends Archivist Meetings:   Marland Kitchen Marital Status:   Intimate Partner Violence:   . Fear of Current or Ex-Partner:   . Emotionally  Abused:   Marland Kitchen Physically Abused:   . Sexually Abused:     Outpatient Medications Prior to Visit  Medication Sig Dispense Refill  . acarbose (PRECOSE) 25 MG tablet Take 1 tablet (25 mg total) by mouth 3 (three) times daily with meals. 90 tablet 3  . allopurinol (ZYLOPRIM) 300 MG tablet TAKE 1 TABLET BY MOUTH AT BEDTIME 90 tablet 2  . atorvastatin (LIPITOR) 40 MG tablet Take 1 tablet (40 mg total) by mouth daily. 90 tablet 3  . colesevelam (WELCHOL) 625 MG tablet Take 3 tablets (1,875 mg total) by mouth daily. 180 tablet 3  . Dupilumab (DUPIXENT) 300 MG/2ML SOSY Inject 300 mg into the skin every 14 (fourteen) days.    . empagliflozin (JARDIANCE) 25 MG TABS tablet Take 25 mg by mouth daily before breakfast. 90 tablet 3  . gabapentin (NEURONTIN) 300 MG capsule Take 300 mg by mouth as needed.     Marland Kitchen glucose blood (ONETOUCH VERIO) test strip 1 each by Other route daily. Use as instructed  100 each 3  . metFORMIN (GLUCOPHAGE-XR) 500 MG 24 hr tablet TAKE 2 TABLETS BY MOUTH EVERY DAY WITH BREAKFAST 180 tablet 3  . omega-3 acid ethyl esters (LOVAZA) 1 g capsule TAKE 2 CAPSULES BY MOUTH TWICE A DAY 360 capsule 2  . oxyCODONE-acetaminophen (PERCOCET) 10-325 MG tablet Take 1 tablet by mouth every 4 (four) hours as needed for pain. 60 tablet 0  . PRESCRIPTION MEDICATION Apply 1 application topically 2 (two) times daily. Calcitriol ointment 63mcg/g. [facility] [place].    . repaglinide (PRANDIN) 2 MG tablet TAKE 1 TABLET (2 MG TOTAL) BY MOUTH 3 (THREE) TIMES DAILY BEFORE MEALS. 270 tablet 3  . testosterone cypionate (DEPOTESTOSTERONE CYPIONATE) 200 MG/ML injection INJECT INTRAMUSCULARLY ONCE A MONTH 3 mL 3   No facility-administered medications prior to visit.    Allergies  Allergen Reactions  . Bromocriptine Other (See Comments)    GI issues  . Ciprofloxacin Other (See Comments)    "Tendon issues"  . Jardiance [Empagliflozin] Other (See Comments)    Frequent urination  . Trulicity [Dulaglutide] Other (See Comments)    GI issues  . Morphine And Related Other (See Comments)    Pt states does not work at relieving pain - states can not tell it works at all     ROS Review of Systems  Constitutional: Negative.   HENT: Negative.   Eyes: Negative.   Respiratory: Negative.   Cardiovascular: Negative.   Gastrointestinal: Negative.   Endocrine: Negative.   Genitourinary: Negative.   Musculoskeletal: Negative.   Skin: Negative.   Neurological: Negative.   Hematological: Negative.   Psychiatric/Behavioral: Negative.       Objective:    Physical Exam  Constitutional: He appears well-developed and well-nourished.  Cardiovascular: Normal rate, normal heart sounds and intact distal pulses.  Pulmonary/Chest: Effort normal and breath sounds normal.  Musculoskeletal:     Left ankle: Tenderness present.       Feet:     Comments: Tenderness only anterior ot medial  malleolus.  Negative drawer, no instability of medial ligament complex, no synovitis at present.  Full ROM ankle.  Vitals reviewed.   BP (!) 108/52   Pulse 81   Temp 97.8 F (36.6 C)   Ht 5\' 11"  (1.803 m)   Wt 274 lb (124.3 kg)   SpO2 98%   BMI 38.22 kg/m  Wt Readings from Last 3 Encounters:  02/22/20 274 lb (124.3 kg)  02/16/20 274 lb (  124.3 kg)  02/08/20 274 lb 12.8 oz (124.6 kg)     Health Maintenance Due  Topic Date Due  . Hepatitis C Screening  Never done  . OPHTHALMOLOGY EXAM  Never done  . URINE MICROALBUMIN  Never done  . HIV Screening  Never done  . COVID-19 Vaccine (1) Never done  . TETANUS/TDAP  Never done  . COLONOSCOPY  Never done  . FOOT EXAM  11/05/2019    There are no preventive care reminders to display for this patient.  Lab Results  Component Value Date   TSH 1.89 09/07/2019   Lab Results  Component Value Date   WBC 6.1 08/31/2015   HGB 14.3 08/31/2015   HCT 41.4 08/31/2015   MCV 90.6 08/31/2015   PLT 172 08/31/2015   Lab Results  Component Value Date   NA 137 09/07/2019   K 4.4 09/07/2019   CO2 24 09/07/2019   GLUCOSE 189 (H) 09/07/2019   BUN 20 09/07/2019   CREATININE 1.16 09/07/2019   CALCIUM 9.5 09/07/2019   ANIONGAP 7 08/31/2015   GFR 63.40 09/07/2019   No results found for: CHOL No results found for: HDL No results found for: LDLCALC No results found for: TRIG No results found for: CHOLHDL Lab Results  Component Value Date   HGBA1C 8.0 (A) 02/16/2020      Assessment & Plan:   Problem List Items Addressed This Visit      Musculoskeletal and Integument   Synovitis of left ankle    The left ankle is improved but still some pain over osteophye.  May need orthopedics if not helping.         No orders of the defined types were placed in this encounter.   Follow-up: No follow-ups on file.    Brent Bulla, MD

## 2020-02-25 ENCOUNTER — Other Ambulatory Visit: Payer: Self-pay | Admitting: Legal Medicine

## 2020-03-01 ENCOUNTER — Ambulatory Visit: Payer: 59 | Admitting: Legal Medicine

## 2020-03-01 ENCOUNTER — Other Ambulatory Visit: Payer: Self-pay

## 2020-03-01 ENCOUNTER — Encounter: Payer: Self-pay | Admitting: Legal Medicine

## 2020-03-01 VITALS — BP 110/58 | HR 80 | Temp 97.4°F | Resp 18 | Ht 71.0 in | Wt 275.0 lb

## 2020-03-01 DIAGNOSIS — L03115 Cellulitis of right lower limb: Secondary | ICD-10-CM | POA: Diagnosis not present

## 2020-03-01 DIAGNOSIS — L02415 Cutaneous abscess of right lower limb: Secondary | ICD-10-CM | POA: Diagnosis not present

## 2020-03-01 MED ORDER — SULFAMETHOXAZOLE-TRIMETHOPRIM 800-160 MG PO TABS: 1.0000 | ORAL_TABLET | Freq: Two times a day (BID) | ORAL | 0 refills | Status: DC

## 2020-03-01 NOTE — Assessment & Plan Note (Signed)
patient has post traumatic cellulitis right lower leg.  Keep clean call in bactrim DS for 10 days, he has had MRSA in past.  Follow up one week.

## 2020-03-01 NOTE — Progress Notes (Signed)
Acute Office Visit  Subjective:    Patient ID: Brent Duarte, male    DOB: Feb 19, 1956, 64 y.o.   MRN: [id number]  Chief Complaint  Patient presents with  . Cellulitis    Right leg    HPI Patient is in today for Patient fell one week ago and now he has redness and pain in right pretibial area of lower leg.  No fever or chills.  Past Medical History:  Diagnosis Date  . Arthritis   . BPH without obstruction/lower urinary tract symptoms 02/01/2020  . Diabetes mellitus without complication ([place])   . History of chicken pox   . History of kidney stones   . History of measles   . History of mumps   . Hyperlipidemia   . Hypertension   . Idiopathic gout of right knee 02/01/2020  . Imbalance    secondary to back issues  . Multiple gastric ulcers   . Numbness and tingling of both legs   . Pneumonia   . Psoriasis vulgaris 02/01/2020  . Recurrent falls     Past Surgical History:  Procedure Laterality Date  . bicep tear     right lower arm related to overgrowth of bone as stated per pt   . KNEE ARTHROSCOPY     bilat  . lithrotripsy     for kidney stones  . LUMBAR LAMINECTOMY/DECOMPRESSION MICRODISCECTOMY N/A 09/07/2015   Procedure: MICRO LUMBAR DECOMPRESSION L4 - L5, REMOVAL OF SYNOVIAL CYST L4-L5;  Surgeon: Susa Day, MD;  Location: [facility];  Service: Orthopedics;  Laterality: N/A;  . URETERAL STENT PLACEMENT     secondary to kidney stones/pt states had laser surgery prior     Family History  Problem Relation Age of Onset  . Lung cancer Mother   . Lung cancer Father   . Diabetes Neg Hx     Social History   Socioeconomic History  . Marital status: Married    Spouse name: Not on file  . Number of children: Not on file  . Years of education: Not on file  . Highest education level: Not on file  Occupational History    Employer: [employer]   Tobacco Use  . Smoking status: Never Smoker  . Smokeless tobacco: Never Used  Substance and Sexual Activity  .  Alcohol use: No  . Drug use: No  . Sexual activity: Yes    Partners: Female  Other Topics Concern  . Not on file  Social History Narrative  . Not on file   Social Determinants of Health   Financial Resource Strain:   . Difficulty of Paying Living Expenses:   Food Insecurity:   . Worried About Charity fundraiser in the Last Year:   . Arboriculturist in the Last Year:   Transportation Needs:   . Film/video editor (Medical):   Marland Kitchen Lack of Transportation (Non-Medical):   Physical Activity:   . Days of Exercise per Week:   . Minutes of Exercise per Session:   Stress:   . Feeling of Stress :   Social Connections:   . Frequency of Communication with Friends and Family:   . Frequency of Social Gatherings with Friends and Family:   . Attends Religious Services:   . Active Member of Clubs or Organizations:   . Attends Archivist Meetings:   Marland Kitchen Marital Status:   Intimate Partner Violence:   . Fear of Current or Ex-Partner:   . Emotionally Abused:   .  Physically Abused:   . Sexually Abused:     Outpatient Medications Prior to Visit  Medication Sig Dispense Refill  . acarbose (PRECOSE) 25 MG tablet Take 1 tablet (25 mg total) by mouth 3 (three) times daily with meals. 90 tablet 3  . allopurinol (ZYLOPRIM) 300 MG tablet TAKE 1 TABLET BY MOUTH AT BEDTIME 90 tablet 2  . atorvastatin (LIPITOR) 40 MG tablet Take 1 tablet (40 mg total) by mouth daily. 90 tablet 3  . colesevelam (WELCHOL) 625 MG tablet Take 3 tablets (1,875 mg total) by mouth daily. 180 tablet 3  . Dupilumab (DUPIXENT) 300 MG/2ML SOSY Inject 300 mg into the skin every 14 (fourteen) days.    . empagliflozin (JARDIANCE) 25 MG TABS tablet Take 25 mg by mouth daily before breakfast. 90 tablet 3  . gabapentin (NEURONTIN) 300 MG capsule Take 300 mg by mouth as needed.     Marland Kitchen glucose blood (ONETOUCH VERIO) test strip 1 each by Other route daily. Use as instructed 100 each 3  . lisinopril (ZESTRIL) 20 MG tablet TAKE 1  TABLET BY MOUTH EVERY DAY AS DIRECTED 90 tablet 2  . metFORMIN (GLUCOPHAGE-XR) 500 MG 24 hr tablet TAKE 2 TABLETS BY MOUTH EVERY DAY WITH BREAKFAST 180 tablet 3  . omega-3 acid ethyl esters (LOVAZA) 1 g capsule TAKE 2 CAPSULES BY MOUTH TWICE A DAY 360 capsule 2  . oxyCODONE-acetaminophen (PERCOCET) 10-325 MG tablet Take 1 tablet by mouth every 4 (four) hours as needed for pain. 60 tablet 0  . PRESCRIPTION MEDICATION Apply 1 application topically 2 (two) times daily. Calcitriol ointment 73mcg/g. [facility] [place].    . repaglinide (PRANDIN) 2 MG tablet TAKE 1 TABLET (2 MG TOTAL) BY MOUTH 3 (THREE) TIMES DAILY BEFORE MEALS. 270 tablet 3  . testosterone cypionate (DEPOTESTOSTERONE CYPIONATE) 200 MG/ML injection INJECT INTRAMUSCULARLY ONCE A MONTH 3 mL 3   No facility-administered medications prior to visit.    Allergies  Allergen Reactions  . Bromocriptine Other (See Comments)    GI issues  . Ciprofloxacin Other (See Comments)    "Tendon issues"  . Jardiance [Empagliflozin] Other (See Comments)    Frequent urination  . Trulicity [Dulaglutide] Other (See Comments)    GI issues  . Morphine And Related Other (See Comments)    Pt states does not work at relieving pain - states can not tell it works at all     Review of Systems  HENT: Negative.   Respiratory: Negative.   Cardiovascular: Negative.   Genitourinary: Negative.   Musculoskeletal: Negative.   Skin: Positive for rash.  Psychiatric/Behavioral: Negative.        Objective:    Physical Exam Vitals reviewed.  Cardiovascular:     Rate and Rhythm: Normal rate and regular rhythm.     Pulses: Normal pulses.     Heart sounds: Normal heart sounds.  Pulmonary:     Breath sounds: Normal breath sounds.  Skin:    Findings: Bruising and rash present.          Comments: Rash right lower leg, tender to touch 11 by 5cm     BP (!) 110/58   Pulse 80   Temp (!) 97.4 F (36.3 C)   Resp 18   Ht 5\' 11"  (1.803 m)    Wt 275 lb (124.7 kg)   SpO2 98%   BMI 38.35 kg/m  Wt Readings from Last 3 Encounters:  03/01/20 275 lb (124.7 kg)  02/22/20 274 lb (124.3 kg)  02/16/20 274 lb (124.3 kg)    Health Maintenance Due  Topic Date Due  . Hepatitis C Screening  Never done  . OPHTHALMOLOGY EXAM  Never done  . HIV Screening  Never done  . COVID-19 Vaccine (1) Never done  . TETANUS/TDAP  Never done  . COLONOSCOPY  Never done  . FOOT EXAM  11/05/2019    There are no preventive care reminders to display for this patient.   Lab Results  Component Value Date   TSH 1.89 09/07/2019   Lab Results  Component Value Date   WBC 6.1 08/31/2015   HGB 14.3 08/31/2015   HCT 41.4 08/31/2015   MCV 90.6 08/31/2015   PLT 172 08/31/2015   Lab Results  Component Value Date   NA 137 09/07/2019   K 4.4 09/07/2019   CO2 24 09/07/2019   GLUCOSE 189 (H) 09/07/2019   BUN 20 09/07/2019   CREATININE 1.16 09/07/2019   CALCIUM 9.5 09/07/2019   ANIONGAP 7 08/31/2015   GFR 63.40 09/07/2019   No results found for: CHOL No results found for: HDL No results found for: LDLCALC No results found for: TRIG No results found for: CHOLHDL Lab Results  Component Value Date   HGBA1C 8.0 (A) 02/16/2020       Assessment & Plan:   Problem List Items Addressed This Visit      Other   Cellulitis and abscess of right leg - Primary    patient has post traumatic cellulitis right lower leg.  Keep clean call in bactrim DS for 10 days, he has had MRSA in past.  Follow up one week.      Relevant Medications   sulfamethoxazole-trimethoprim (BACTRIM DS) 800-160 MG tablet       Meds ordered this encounter  Medications  . sulfamethoxazole-trimethoprim (BACTRIM DS) 800-160 MG tablet    Sig: Take 1 tablet by mouth 2 (two) times daily.    Dispense:  20 tablet    Refill:  0     Brent Bulla, MD

## 2020-03-11 ENCOUNTER — Encounter: Payer: Self-pay | Admitting: Legal Medicine

## 2020-03-11 ENCOUNTER — Other Ambulatory Visit: Payer: Self-pay

## 2020-03-11 ENCOUNTER — Ambulatory Visit: Payer: 59 | Admitting: Legal Medicine

## 2020-03-11 VITALS — BP 110/50 | HR 76 | Temp 97.8°F | Resp 20 | Wt 270.0 lb

## 2020-03-11 DIAGNOSIS — A09 Infectious gastroenteritis and colitis, unspecified: Secondary | ICD-10-CM

## 2020-03-11 DIAGNOSIS — I10 Essential (primary) hypertension: Secondary | ICD-10-CM

## 2020-03-11 DIAGNOSIS — M5136 Other intervertebral disc degeneration, lumbar region: Secondary | ICD-10-CM

## 2020-03-11 DIAGNOSIS — E1121 Type 2 diabetes mellitus with diabetic nephropathy: Secondary | ICD-10-CM

## 2020-03-11 DIAGNOSIS — E291 Testicular hypofunction: Secondary | ICD-10-CM

## 2020-03-11 DIAGNOSIS — E669 Obesity, unspecified: Secondary | ICD-10-CM

## 2020-03-11 DIAGNOSIS — E7800 Pure hypercholesterolemia, unspecified: Secondary | ICD-10-CM

## 2020-03-11 DIAGNOSIS — E1122 Type 2 diabetes mellitus with diabetic chronic kidney disease: Secondary | ICD-10-CM | POA: Insufficient documentation

## 2020-03-11 DIAGNOSIS — E1169 Type 2 diabetes mellitus with other specified complication: Secondary | ICD-10-CM

## 2020-03-11 DIAGNOSIS — N1831 Chronic kidney disease, stage 3a: Secondary | ICD-10-CM

## 2020-03-11 DIAGNOSIS — M51369 Other intervertebral disc degeneration, lumbar region without mention of lumbar back pain or lower extremity pain: Secondary | ICD-10-CM

## 2020-03-11 MED ORDER — TESTOSTERONE CYPIONATE 200 MG/ML IM SOLN
200.0000 mg | Freq: Once | INTRAMUSCULAR | Status: AC
  Administered 2020-03-11: 200 mg via INTRAMUSCULAR

## 2020-03-11 MED ORDER — METRONIDAZOLE 500 MG PO TABS: 500.0000 mg | ORAL_TABLET | Freq: Three times a day (TID) | ORAL | 0 refills | Status: DC

## 2020-03-11 NOTE — Progress Notes (Signed)
Established Patient Office Visit  Subjective:  Patient ID: Brent Duarte, male    DOB: [birth year]  Age: 64 y.o. MRN: [id number]  CC:  Chief Complaint  Patient presents with  . Diabetes  . Hyperlipidemia  . Hypertension    HPI Brent Duarte presents for Chronic visit  Patient present with type 2 diabetes.  Specifically, this is type 2, non insulin requiring diabetes, complicated by Hypercholesterolemia and hypertension.  Compliance with treatment has been good; patient take medicines as directed, maintains diet and exercise regimen, follows up as directed, and is keeping glucose diary.  Date of  diagnosis 2015.  Depression screen has been performed.Tobacco screen nonsmoker. Current medicines for diabetes prandin, metformin, precose.  Patient is on lisinopri for renal protection and atorvastatin for cholesterol control.  Patient performs foot exams daily and last ophthalmologic exam was juy 2020.  Patient presents for follow up of hypertension.  Patient tolerating lisinopril well with side effects.  Patient was diagnosed with hypertension 10 so has been treated for hypertension for 10 years.Patient is working on maintaining diet and exercise regimen and follows up as directed. Complication include none.  Patient presents with hyperlipidemia.  Compliance with treatment has been good; patient takes medicines as directed, maintains low cholesterol diet, follows up as directed, and maintains exercise regimen.  Patient is using TORVASTATIN without problems.  Past Medical History:  Diagnosis Date  . Arthritis   . BPH without obstruction/lower urinary tract symptoms 02/01/2020  . Diabetes mellitus without complication ([place])   . History of chicken pox   . History of kidney stones   . History of measles   . History of mumps   . Hyperlipidemia   . Hypertension   . Idiopathic gout of right knee 02/01/2020  . Imbalance    secondary to back issues  . Multiple gastric ulcers   . Numbness and  tingling of both legs   . Pneumonia   . Psoriasis vulgaris 02/01/2020  . Recurrent falls     Past Surgical History:  Procedure Laterality Date  . bicep tear     right lower arm related to overgrowth of bone as stated per pt   . KNEE ARTHROSCOPY     bilat  . lithrotripsy     for kidney stones  . LUMBAR LAMINECTOMY/DECOMPRESSION MICRODISCECTOMY N/A 09/07/2015   Procedure: MICRO LUMBAR DECOMPRESSION L4 - L5, REMOVAL OF SYNOVIAL CYST L4-L5;  Surgeon: Susa Day, MD;  Location: [facility];  Service: Orthopedics;  Laterality: N/A;  . URETERAL STENT PLACEMENT     secondary to kidney stones/pt states had laser surgery prior     Family History  Problem Relation Age of Onset  . Lung cancer Mother   . Lung cancer Father   . Diabetes Neg Hx     Social History   Socioeconomic History  . Marital status: Married    Spouse name: Not on file  . Number of children: Not on file  . Years of education: Not on file  . Highest education level: Not on file  Occupational History    Employer: [employer]   Tobacco Use  . Smoking status: Never Smoker  . Smokeless tobacco: Never Used  Substance and Sexual Activity  . Alcohol use: No  . Drug use: No  . Sexual activity: Yes    Partners: Female  Other Topics Concern  . Not on file  Social History Narrative  . Not on file   Social Determinants of Health  Financial Resource Strain:   . Difficulty of Paying Living Expenses:   Food Insecurity:   . Worried About Programme researcher, broadcasting/film/video in the Last Year:   . Barista in the Last Year:   Transportation Needs:   . Freight forwarder (Medical):   Marland Kitchen Lack of Transportation (Non-Medical):   Physical Activity:   . Days of Exercise per Week:   . Minutes of Exercise per Session:   Stress:   . Feeling of Stress :   Social Connections:   . Frequency of Communication with Friends and Family:   . Frequency of Social Gatherings with Friends and Family:   . Attends Religious Services:   .  Active Member of Clubs or Organizations:   . Attends Banker Meetings:   Marland Kitchen Marital Status:   Intimate Partner Violence:   . Fear of Current or Ex-Partner:   . Emotionally Abused:   Marland Kitchen Physically Abused:   . Sexually Abused:     Outpatient Medications Prior to Visit  Medication Sig Dispense Refill  . acarbose (PRECOSE) 25 MG tablet Take 1 tablet (25 mg total) by mouth 3 (three) times daily with meals. 90 tablet 3  . allopurinol (ZYLOPRIM) 300 MG tablet TAKE 1 TABLET BY MOUTH AT BEDTIME 90 tablet 2  . atorvastatin (LIPITOR) 40 MG tablet Take 1 tablet (40 mg total) by mouth daily. 90 tablet 3  . colesevelam (WELCHOL) 625 MG tablet Take 3 tablets (1,875 mg total) by mouth daily. 180 tablet 3  . Dupilumab (DUPIXENT) 300 MG/2ML SOSY Inject 300 mg into the skin every 14 (fourteen) days.    . empagliflozin (JARDIANCE) 25 MG TABS tablet Take 25 mg by mouth daily before breakfast. 90 tablet 3  . gabapentin (NEURONTIN) 300 MG capsule Take 300 mg by mouth as needed.     Marland Kitchen glucose blood (ONETOUCH VERIO) test strip 1 each by Other route daily. Use as instructed 100 each 3  . lisinopril (ZESTRIL) 20 MG tablet TAKE 1 TABLET BY MOUTH EVERY DAY AS DIRECTED 90 tablet 2  . metFORMIN (GLUCOPHAGE-XR) 500 MG 24 hr tablet TAKE 2 TABLETS BY MOUTH EVERY DAY WITH BREAKFAST 180 tablet 3  . omega-3 acid ethyl esters (LOVAZA) 1 g capsule TAKE 2 CAPSULES BY MOUTH TWICE A DAY 360 capsule 2  . oxyCODONE-acetaminophen (PERCOCET) 10-325 MG tablet Take 1 tablet by mouth every 4 (four) hours as needed for pain. 60 tablet 0  . PRESCRIPTION MEDICATION Apply 1 application topically 2 (two) times daily. Calcitriol ointment 55mcg/g. [facility] [place].    . repaglinide (PRANDIN) 2 MG tablet TAKE 1 TABLET (2 MG TOTAL) BY MOUTH 3 (THREE) TIMES DAILY BEFORE MEALS. 270 tablet 3  . sulfamethoxazole-trimethoprim (BACTRIM DS) 800-160 MG tablet Take 1 tablet by mouth 2 (two) times daily. 20 tablet 0  . testosterone  cypionate (DEPOTESTOSTERONE CYPIONATE) 200 MG/ML injection INJECT INTRAMUSCULARLY ONCE A MONTH 3 mL 3   No facility-administered medications prior to visit.    Allergies  Allergen Reactions  . Bromocriptine Other (See Comments)    GI issues  . Ciprofloxacin Other (See Comments)    "Tendon issues"  . Jardiance [Empagliflozin] Other (See Comments)    Frequent urination  . Trulicity [Dulaglutide] Other (See Comments)    GI issues  . Morphine And Related Other (See Comments)    Pt states does not work at relieving pain - states can not tell it works at all     ROS  Review of Systems  Constitutional: Negative.   HENT: Negative.   Eyes: Negative.   Respiratory: Negative.   Cardiovascular: Negative.   Gastrointestinal: Positive for diarrhea.  Endocrine: Negative.   Musculoskeletal: Negative.   Neurological: Negative.   Psychiatric/Behavioral: Negative.       Objective:    Physical Exam  Constitutional: He is oriented to person, place, and time. He appears well-developed and well-nourished.  HENT:  Head: Normocephalic and atraumatic.  Right Ear: External ear normal.  Left Ear: External ear normal.  Nose: Nose normal.  Mouth/Throat: Oropharynx is clear and moist.  Eyes: Pupils are equal, round, and reactive to light. Conjunctivae and EOM are normal.  Cardiovascular: Normal rate, regular rhythm, normal heart sounds and intact distal pulses.  Pulmonary/Chest: Effort normal and breath sounds normal.  Abdominal: Soft. Bowel sounds are normal.  Musculoskeletal:        General: Normal range of motion.     Cervical back: Normal range of motion and neck supple.  Neurological: He is alert and oriented to person, place, and time. He has normal reflexes.  numbess tips of toes  Skin: Skin is warm and dry.  Psychiatric: He has a normal mood and affect.  Vitals reviewed.   BP (!) 110/50   Pulse 76   Temp 97.8 F (36.6 C)   Resp 20   Wt 270 lb (122.5 kg)   SpO2 98%   BMI  37.66 kg/m  Wt Readings from Last 3 Encounters:  03/11/20 270 lb (122.5 kg)  03/01/20 275 lb (124.7 kg)  02/22/20 274 lb (124.3 kg)     Health Maintenance Due  Topic Date Due  . Hepatitis C Screening  Never done  . OPHTHALMOLOGY EXAM  Never done  . HIV Screening  Never done  . TETANUS/TDAP  Never done  . COLONOSCOPY  Never done  . FOOT EXAM  11/05/2019    There are no preventive care reminders to display for this patient.  Lab Results  Component Value Date   TSH 1.89 09/07/2019   Lab Results  Component Value Date   WBC 6.1 08/31/2015   HGB 14.3 08/31/2015   HCT 41.4 08/31/2015   MCV 90.6 08/31/2015   PLT 172 08/31/2015   Lab Results  Component Value Date   NA 137 09/07/2019   K 4.4 09/07/2019   CO2 24 09/07/2019   GLUCOSE 189 (H) 09/07/2019   BUN 20 09/07/2019   CREATININE 1.16 09/07/2019   CALCIUM 9.5 09/07/2019   ANIONGAP 7 08/31/2015   GFR 63.40 09/07/2019   No results found for: CHOL No results found for: HDL No results found for: LDLCALC No results found for: TRIG No results found for: CHOLHDL Lab Results  Component Value Date   HGBA1C 8.0 (A) 02/16/2020      Assessment & Plan:   Hypertension An individual hypertension care plan was established and reinforced today.  The patient's status was assessed using clinical findings on exam and labs or diagnostic tests. The patient's success at meeting treatment goals on disease specific evidence-based guidelines and found to be well controlled. SELF MANAGEMENT: The patient and I together assessed ways to personally work towards obtaining the recommended goals. RECOMMENDATIONS: avoid decongestants found in common cold remedies, decrease consumption of alcohol, perform routine monitoring of BP with home BP cuff, exercise, reduction of dietary salt, take medicines as prescribed, try not to miss doses and quit smoking.  Regular exercise and maintaining a healthy weight is needed.  Stress reduction may help.  A  CLINICAL SUMMARY including written plan identify barriers to care unique to individual due to social or financial issues.  We attempt to mutually creat solutions for individual and family understanding.  Hypogonadism AN INDIVIDUAL CARE PLAN was established and reinforced today.  The patient's status was assessed using clinical findings on exam, labs, and other diagnostic testing. Patient's success at meeting treatment goals based on disease specific evidence-bassed guidelines and found to be in good control. RECOMMENDATIONS include maintaining present medicines and treatment.  Diabetes An individual care plan for diabetes was established and reinforced today.  The patient's status was assessed using clinical findings on exam, labs and diagnostic testing. Patient success at meeting goals based on disease specific evidence-based guidelines and found to be good controlled. Medications were assessed and patient's understanding of the medical issues , including barriers were assessed. Recommend adherence to a diabetic diet, a graduated exercise program, HgbA1c level is checked quarterly, and urine microalbumin performed yearly .  Annual mono-filament sensation testing performed. Lower blood pressure and control hyperlipidemia is important. Get annual eye exams and annual flu shots and smoking cessation discussed.  Self management goals were discussed.  Degenerative lumbar disc disease AN INDIVIDUAL CARE PLAN for back pain was established and reinforced today.  The patient's status was assessed using clinical findings on exam, labs, and other diagnostic testing. Patient's success at meeting treatment goals based on disease specific evidence-bassed guidelines and found to be in fair control. RECOMMENDATIONS include maintaining present medicines and treatment.  Long term opioids AN INDIVIDUAL CARE PLAN was established and reinforced today.  The patient's status was assessed using clinical findings on exam,  labs, and other diagnostic testing. Patient's success at meeting treatment goals based on disease specific evidence-bassed guidelines and found to be in fair control. RECOMMENDATIONS include maintining present medicines and treatment. He is on chronicoxycodone with no abuse.  Negative REMS.  No orders of the defined types were placed in this encounter.   Follow-up: No follow-ups on file.  Return in about 4 months (around 07/12/2020) for fasting.   Brent Bulla, MD

## 2020-03-12 LAB — CBC WITH DIFFERENTIAL/PLATELET
Basophils Absolute: 0 10*3/uL (ref 0.0–0.2)
Basos: 0 %
EOS (ABSOLUTE): 0.1 10*3/uL (ref 0.0–0.4)
Eos: 2 %
Hematocrit: 42 % (ref 37.5–51.0)
Hemoglobin: 13.9 g/dL (ref 13.0–17.7)
Immature Grans (Abs): 0.1 10*3/uL (ref 0.0–0.1)
Immature Granulocytes: 2 %
Lymphocytes Absolute: 1.3 10*3/uL (ref 0.7–3.1)
Lymphs: 24 %
MCH: 31.4 pg (ref 26.6–33.0)
MCHC: 33.1 g/dL (ref 31.5–35.7)
MCV: 95 fL (ref 79–97)
Monocytes Absolute: 0.4 10*3/uL (ref 0.1–0.9)
Monocytes: 8 %
Neutrophils Absolute: 3.4 10*3/uL (ref 1.4–7.0)
Neutrophils: 64 %
Platelets: 198 10*3/uL (ref 150–450)
RBC: 4.42 x10E6/uL (ref 4.14–5.80)
RDW: 15 % (ref 11.6–15.4)
WBC: 5.3 10*3/uL (ref 3.4–10.8)

## 2020-03-12 LAB — COMPREHENSIVE METABOLIC PANEL
ALT: 27 IU/L (ref 0–44)
AST: 21 IU/L (ref 0–40)
Albumin/Globulin Ratio: 2.8 — ABNORMAL HIGH (ref 1.2–2.2)
Albumin: 4.7 g/dL (ref 3.8–4.8)
Alkaline Phosphatase: 110 IU/L (ref 48–121)
BUN/Creatinine Ratio: 21 (ref 10–24)
BUN: 36 mg/dL — ABNORMAL HIGH (ref 8–27)
Bilirubin Total: 0.5 mg/dL (ref 0.0–1.2)
CO2: 13 mmol/L — ABNORMAL LOW (ref 20–29)
Calcium: 9.4 mg/dL (ref 8.6–10.2)
Chloride: 112 mmol/L — ABNORMAL HIGH (ref 96–106)
Creatinine, Ser: 1.68 mg/dL — ABNORMAL HIGH (ref 0.76–1.27)
GFR calc Af Amer: 49 mL/min/{1.73_m2} — ABNORMAL LOW (ref >59)
GFR calc non Af Amer: 42 mL/min/{1.73_m2} — ABNORMAL LOW (ref >59)
Globulin, Total: 1.7 g/dL (ref 1.5–4.5)
Glucose: 114 mg/dL — ABNORMAL HIGH (ref 65–99)
Potassium: 5.4 mmol/L — ABNORMAL HIGH (ref 3.5–5.2)
Sodium: 138 mmol/L (ref 134–144)
Total Protein: 6.4 g/dL (ref 6.0–8.5)

## 2020-03-12 LAB — HEMOGLOBIN A1C
Est. average glucose Bld gHb Est-mCnc: 157 mg/dL
Hgb A1c MFr Bld: 7.1 % — ABNORMAL HIGH (ref 4.8–5.6)

## 2020-03-12 LAB — LIPID PANEL
Chol/HDL Ratio: 3.6 ratio (ref 0.0–5.0)
Cholesterol, Total: 108 mg/dL (ref 100–199)
HDL: 30 mg/dL — ABNORMAL LOW (ref >39)
LDL Chol Calc (NIH): 47 mg/dL (ref 0–99)
Triglycerides: 191 mg/dL — ABNORMAL HIGH (ref 0–149)
VLDL Cholesterol Cal: 31 mg/dL (ref 5–40)

## 2020-03-12 LAB — CARDIOVASCULAR RISK ASSESSMENT

## 2020-03-13 NOTE — Progress Notes (Signed)
CBC normal, glucose 114, kidney tests stage 3, potassium high at 5,4, liver tests normal, Triglycerideshigh at 191, A1c 7.1 lp

## 2020-04-06 ENCOUNTER — Other Ambulatory Visit: Payer: Self-pay

## 2020-04-06 ENCOUNTER — Ambulatory Visit (INDEPENDENT_AMBULATORY_CARE_PROVIDER_SITE_OTHER): Payer: 59

## 2020-04-06 DIAGNOSIS — E291 Testicular hypofunction: Secondary | ICD-10-CM

## 2020-04-06 MED ORDER — TESTOSTERONE CYPIONATE 200 MG/ML IM SOLN
200.0000 mg | Freq: Once | INTRAMUSCULAR | Status: AC
  Administered 2020-04-06: 200 mg via INTRAMUSCULAR

## 2020-04-06 NOTE — Progress Notes (Signed)
Patient came in today for testosterone injection. 

## 2020-04-19 ENCOUNTER — Other Ambulatory Visit: Payer: Self-pay

## 2020-04-19 ENCOUNTER — Ambulatory Visit: Payer: 59 | Admitting: Endocrinology

## 2020-04-19 ENCOUNTER — Encounter: Payer: Self-pay | Admitting: Endocrinology

## 2020-04-19 VITALS — BP 120/74 | HR 94 | Ht 71.0 in | Wt 274.8 lb

## 2020-04-19 DIAGNOSIS — E1122 Type 2 diabetes mellitus with diabetic chronic kidney disease: Secondary | ICD-10-CM

## 2020-04-19 DIAGNOSIS — E1121 Type 2 diabetes mellitus with diabetic nephropathy: Secondary | ICD-10-CM | POA: Diagnosis not present

## 2020-04-19 DIAGNOSIS — N1831 Chronic kidney disease, stage 3a: Secondary | ICD-10-CM | POA: Diagnosis not present

## 2020-04-19 LAB — POCT GLYCOSYLATED HEMOGLOBIN (HGB A1C): Hemoglobin A1C: 6.2 % — AB (ref 4.0–5.6)

## 2020-04-19 NOTE — Patient Instructions (Addendum)
Please stay off the acarbose. Please continue the same other medications.   check your blood sugar once a day.  vary the time of day when you check, between before the 3 meals, and at bedtime.  also check if you have symptoms of your blood sugar being too high or too low.  please keep a record of the readings and bring it to your next appointment here (or you can bring the meter itself).  You can write it on any piece of paper.  please call us sooner if your blood sugar goes below 70, or if you have a lot of readings over 200. Please come back for a follow-up appointment in 3-4 months.

## 2020-04-19 NOTE — Progress Notes (Signed)
Subjective:    Patient ID: Brent Duarte, male    DOB: 11-29-1955, 64 y.o.   MRN: [id number]  HPI Pt returns for f/u of diabetes mellitus: DM type: 2 Dx'ed: 2017 Complications: PN and stage 3b CRI Therapy: 4 oral meds DKA: never Severe hypoglycemia: never Pancreatitis: once, in 2018 Pancreatic imaging: none available.  SDOH: He needs to control DM without insulin, due to CDL Other: he has never been on insulin; He had pancreatitis with trulicity; edema limits rx options; CRI limits rx options and dosages; he did not tolerate bromocriptine (n/v).  Interval history: He says cbg's are well-controlled.  He stopped the acarbose, due to diarrhea.  He had a steroid injection into the lower back last week.   Past Medical History:  Diagnosis Date  . Arthritis   . BPH without obstruction/lower urinary tract symptoms 02/01/2020  . History of chicken pox   . History of kidney stones   . History of measles   . History of mumps   . Hyperlipidemia   . Idiopathic gout of right knee 02/01/2020  . Imbalance    secondary to back issues  . Multiple gastric ulcers   . Numbness and tingling of both legs   . Pneumonia   . Psoriasis vulgaris 02/01/2020  . Recurrent falls     Past Surgical History:  Procedure Laterality Date  . bicep tear     right lower arm related to overgrowth of bone as stated per pt   . KNEE ARTHROSCOPY     bilat  . lithrotripsy     for kidney stones  . LUMBAR LAMINECTOMY/DECOMPRESSION MICRODISCECTOMY N/A 09/07/2015   Procedure: MICRO LUMBAR DECOMPRESSION L4 - L5, REMOVAL OF SYNOVIAL CYST L4-L5;  Surgeon: Jene Every, MD;  Location: [facility];  Service: Orthopedics;  Laterality: N/A;  . URETERAL STENT PLACEMENT     secondary to kidney stones/pt states had laser surgery prior     Social History   Socioeconomic History  . Marital status: Married    Spouse name: Not on file  . Number of children: Not on file  . Years of education: Not on file  . Highest education  level: Not on file  Occupational History    Employer: [employer]   Tobacco Use  . Smoking status: Never Smoker  . Smokeless tobacco: Never Used  Substance and Sexual Activity  . Alcohol use: No  . Drug use: No  . Sexual activity: Yes    Partners: Female  Other Topics Concern  . Not on file  Social History Narrative  . Not on file   Social Determinants of Health   Financial Resource Strain:   . Difficulty of Paying Living Expenses:   Food Insecurity:   . Worried About Programme researcher, broadcasting/film/video in the Last Year:   . Barista in the Last Year:   Transportation Needs:   . Freight forwarder (Medical):   Marland Kitchen Lack of Transportation (Non-Medical):   Physical Activity:   . Days of Exercise per Week:   . Minutes of Exercise per Session:   Stress:   . Feeling of Stress :   Social Connections:   . Frequency of Communication with Friends and Family:   . Frequency of Social Gatherings with Friends and Family:   . Attends Religious Services:   . Active Member of Clubs or Organizations:   . Attends Banker Meetings:   Marland Kitchen Marital Status:   Intimate Partner Violence:   .  Fear of Current or Ex-Partner:   . Emotionally Abused:   Marland Kitchen Physically Abused:   . Sexually Abused:     Current Outpatient Medications on File Prior to Visit  Medication Sig Dispense Refill  . allopurinol (ZYLOPRIM) 300 MG tablet TAKE 1 TABLET BY MOUTH AT BEDTIME 90 tablet 2  . atorvastatin (LIPITOR) 40 MG tablet Take 1 tablet (40 mg total) by mouth daily. 90 tablet 3  . colesevelam (WELCHOL) 625 MG tablet Take 3 tablets (1,875 mg total) by mouth daily. 180 tablet 3  . Dupilumab (DUPIXENT) 300 MG/2ML SOSY Inject 300 mg into the skin every 14 (fourteen) days.    . empagliflozin (JARDIANCE) 25 MG TABS tablet Take 25 mg by mouth daily before breakfast. 90 tablet 3  . gabapentin (NEURONTIN) 300 MG capsule Take 300 mg by mouth as needed.     Marland Kitchen glucose blood (ONETOUCH VERIO) test strip 1 each by  Other route daily. Use as instructed 100 each 3  . lisinopril (ZESTRIL) 20 MG tablet TAKE 1 TABLET BY MOUTH EVERY DAY AS DIRECTED 90 tablet 2  . metFORMIN (GLUCOPHAGE-XR) 500 MG 24 hr tablet TAKE 2 TABLETS BY MOUTH EVERY DAY WITH BREAKFAST 180 tablet 3  . metroNIDAZOLE (FLAGYL) 500 MG tablet Take 1 tablet (500 mg total) by mouth 3 (three) times daily. 30 tablet 0  . omega-3 acid ethyl esters (LOVAZA) 1 g capsule TAKE 2 CAPSULES BY MOUTH TWICE A DAY 360 capsule 2  . oxyCODONE-acetaminophen (PERCOCET) 10-325 MG tablet Take 1 tablet by mouth every 4 (four) hours as needed for pain. 60 tablet 0  . PRESCRIPTION MEDICATION Apply 1 application topically 2 (two) times daily. Calcitriol ointment 36mcg/g. [facility] [place].    . repaglinide (PRANDIN) 2 MG tablet TAKE 1 TABLET (2 MG TOTAL) BY MOUTH 3 (THREE) TIMES DAILY BEFORE MEALS. 270 tablet 3  . testosterone cypionate (DEPOTESTOSTERONE CYPIONATE) 200 MG/ML injection INJECT INTRAMUSCULARLY ONCE A MONTH 3 mL 3   No current facility-administered medications on file prior to visit.    Allergies  Allergen Reactions  . Bromocriptine Other (See Comments)    GI issues  . Ciprofloxacin Other (See Comments)    "Tendon issues"  . Jardiance [Empagliflozin] Other (See Comments)    Frequent urination  . Trulicity [Dulaglutide] Other (See Comments)    GI issues  . Morphine And Related Other (See Comments)    Pt states does not work at relieving pain - states can not tell it works at all     Family History  Problem Relation Age of Onset  . Lung cancer Mother   . Lung cancer Father   . Diabetes Neg Hx     BP 120/74   Pulse 94   Ht 5\' 11"  (1.803 m)   Wt 274 lb 12.8 oz (124.6 kg)   SpO2 95%   BMI 38.33 kg/m    Review of Systems He denies hypoglycemia    Objective:   Physical Exam VITAL SIGNS:  See vs page GENERAL: no distress Pulses: dorsalis pedis intact bilat.   MSK: no deformity of the feet CV: 1+ bilat leg edema, and bilat  vv's Skin:  no ulcer on the feet.  normal color and temp on the feet. Neuro: sensation is intact to touch on the feet, but decreased from normal Ext: there is bilateral onychomycosis of the toenails.    Lab Results  Component Value Date   CREATININE 1.68 (H) 03/11/2020   BUN 36 (H) 03/11/2020  NA 138 03/11/2020   K 5.4 (H) 03/11/2020   CL 112 (H) 03/11/2020   CO2 13 (L) 03/11/2020   A1c=6.2%     Assessment & Plan:  Type 2 DM: well-controlled.  Diarrhea, due to acarbose.  Patient Instructions  Please stay off the acarbose. Please continue the same other medications.   check your blood sugar once a day.  vary the time of day when you check, between before the 3 meals, and at bedtime.  also check if you have symptoms of your blood sugar being too high or too low.  please keep a record of the readings and bring it to your next appointment here (or you can bring the meter itself).  You can write it on any piece of paper.  please call us sooner if your blood sugar goes below 70, or if you have a lot of readings over 200. Please come back for a follow-up appointment in 3-4 months.

## 2020-04-22 ENCOUNTER — Other Ambulatory Visit: Payer: Self-pay | Admitting: Legal Medicine

## 2020-04-25 ENCOUNTER — Encounter: Payer: Self-pay | Admitting: Legal Medicine

## 2020-05-04 ENCOUNTER — Other Ambulatory Visit: Payer: Self-pay | Admitting: Endocrinology

## 2020-05-12 ENCOUNTER — Ambulatory Visit (INDEPENDENT_AMBULATORY_CARE_PROVIDER_SITE_OTHER): Payer: 59

## 2020-05-12 ENCOUNTER — Other Ambulatory Visit: Payer: Self-pay

## 2020-05-12 DIAGNOSIS — E291 Testicular hypofunction: Secondary | ICD-10-CM | POA: Diagnosis not present

## 2020-05-12 DIAGNOSIS — M961 Postlaminectomy syndrome, not elsewhere classified: Secondary | ICD-10-CM | POA: Insufficient documentation

## 2020-05-12 MED ORDER — TESTOSTERONE CYPIONATE 200 MG/ML IM SOLN
200.0000 mg | Freq: Once | INTRAMUSCULAR | Status: AC
  Administered 2020-05-12: 200 mg via INTRAMUSCULAR

## 2020-05-12 NOTE — Progress Notes (Signed)
Patient came in today for his testosterone shot.

## 2020-05-17 ENCOUNTER — Ambulatory Visit (INDEPENDENT_AMBULATORY_CARE_PROVIDER_SITE_OTHER): Payer: Self-pay | Admitting: Legal Medicine

## 2020-05-17 ENCOUNTER — Other Ambulatory Visit: Payer: Self-pay

## 2020-05-17 ENCOUNTER — Encounter: Payer: Self-pay | Admitting: Legal Medicine

## 2020-05-17 VITALS — BP 100/52 | HR 89 | Temp 98.6°F | Resp 16 | Ht 71.0 in | Wt 278.0 lb

## 2020-05-17 DIAGNOSIS — Z0289 Encounter for other administrative examinations: Secondary | ICD-10-CM

## 2020-05-17 DIAGNOSIS — Z0000 Encounter for general adult medical examination without abnormal findings: Secondary | ICD-10-CM | POA: Insufficient documentation

## 2020-05-17 LAB — POCT URINALYSIS DIPSTICK
Bilirubin, UA: NEGATIVE
Blood, UA: NEGATIVE
Glucose, UA: POSITIVE — AB
Ketones, UA: NEGATIVE
Leukocytes, UA: NEGATIVE
Nitrite, UA: NEGATIVE
Protein, UA: NEGATIVE
Spec Grav, UA: 1.02 (ref 1.010–1.025)
Urobilinogen, UA: 0.2 E.U./dL
pH, UA: 5.5 (ref 5.0–8.0)

## 2020-05-17 NOTE — Progress Notes (Signed)
Subjective:  Patient ID: Brent Duarte, male    DOB: [birth year]  Age: 64 y.o. MRN: [id number]  Chief Complaint  Patient presents with  . Annual Exam    HPI: DOT physical  Patient here for DOT physical .  He drives only short distances for work.   Current Outpatient Medications on File Prior to Visit  Medication Sig Dispense Refill  . allopurinol (ZYLOPRIM) 300 MG tablet TAKE 1 TABLET BY MOUTH AT BEDTIME 90 tablet 2  . atorvastatin (LIPITOR) 40 MG tablet Take 1 tablet (40 mg total) by mouth daily. 90 tablet 3  . colesevelam (WELCHOL) 625 MG tablet TAKE 3 TABLETS (1,875 MG TOTAL) BY MOUTH DAILY. 540 tablet 1  . Dupilumab (DUPIXENT) 300 MG/2ML SOSY Inject 300 mg into the skin every 14 (fourteen) days.    . empagliflozin (JARDIANCE) 25 MG TABS tablet Take 25 mg by mouth daily before breakfast. 90 tablet 3  . gabapentin (NEURONTIN) 300 MG capsule Take 300 mg by mouth as needed.     Marland Kitchen glucose blood (ONETOUCH VERIO) test strip 1 each by Other route daily. Use as instructed 100 each 3  . lisinopril (ZESTRIL) 20 MG tablet TAKE 1 TABLET BY MOUTH EVERY DAY AS DIRECTED 90 tablet 2  . metFORMIN (GLUCOPHAGE-XR) 500 MG 24 hr tablet TAKE 2 TABLETS BY MOUTH EVERY DAY WITH BREAKFAST 180 tablet 3  . metroNIDAZOLE (FLAGYL) 500 MG tablet Take 1 tablet (500 mg total) by mouth 3 (three) times daily. 30 tablet 0  . omega-3 acid ethyl esters (LOVAZA) 1 g capsule TAKE 2 CAPSULES BY MOUTH TWICE A DAY 360 capsule 2  . oxyCODONE-acetaminophen (PERCOCET) 10-325 MG tablet Take 1 tablet by mouth every 4 (four) hours as needed for pain. 60 tablet 0  . PRESCRIPTION MEDICATION Apply 1 application topically 2 (two) times daily. Calcitriol ointment 89mcg/g. Verified with [facility] [place].    . repaglinide (PRANDIN) 2 MG tablet TAKE 1 TABLET (2 MG TOTAL) BY MOUTH 3 (THREE) TIMES DAILY BEFORE MEALS. 270 tablet 3  . testosterone cypionate (DEPOTESTOSTERONE CYPIONATE) 200 MG/ML injection INJECT INTRAMUSCULARLY ONCE A MONTH 3  mL 3   No current facility-administered medications on file prior to visit.   Past Medical History:  Diagnosis Date  . Arthritis   . BPH without obstruction/lower urinary tract symptoms 02/01/2020  . History of chicken pox   . History of kidney stones   . History of measles   . History of mumps   . Hyperlipidemia   . Idiopathic gout of right knee 02/01/2020  . Imbalance    secondary to back issues  . Multiple gastric ulcers   . Numbness and tingling of both legs   . Pneumonia   . Psoriasis vulgaris 02/01/2020  . Recurrent falls    Past Surgical History:  Procedure Laterality Date  . bicep tear     right lower arm related to overgrowth of bone as stated per pt   . KNEE ARTHROSCOPY     bilat  . lithrotripsy     for kidney stones  . LUMBAR LAMINECTOMY/DECOMPRESSION MICRODISCECTOMY N/A 09/07/2015   Procedure: MICRO LUMBAR DECOMPRESSION L4 - L5, REMOVAL OF SYNOVIAL CYST L4-L5;  Surgeon: Jene Every, MD;  Location: [facility];  Service: Orthopedics;  Laterality: N/A;  . URETERAL STENT PLACEMENT     secondary to kidney stones/pt states had laser surgery prior     Family History  Problem Relation Age of Onset  . Lung cancer Mother   .  Lung cancer Father   . Diabetes Neg Hx    Social History   Socioeconomic History  . Marital status: Married    Spouse name: Not on file  . Number of children: Not on file  . Years of education: Not on file  . Highest education level: Not on file  Occupational History    Employer: [employer]   Tobacco Use  . Smoking status: Never Smoker  . Smokeless tobacco: Never Used  Substance and Sexual Activity  . Alcohol use: No  . Drug use: No  . Sexual activity: Yes    Partners: Female  Other Topics Concern  . Not on file  Social History Narrative  . Not on file   Social Determinants of Health   Financial Resource Strain:   . Difficulty of Paying Living Expenses:   Food Insecurity:   . Worried About Programme researcher, broadcasting/film/video in the Last  Year:   . Barista in the Last Year:   Transportation Needs:   . Freight forwarder (Medical):   Marland Kitchen Lack of Transportation (Non-Medical):   Physical Activity:   . Days of Exercise per Week:   . Minutes of Exercise per Session:   Stress:   . Feeling of Stress :   Social Connections:   . Frequency of Communication with Friends and Family:   . Frequency of Social Gatherings with Friends and Family:   . Attends Religious Services:   . Active Member of Clubs or Organizations:   . Attends Banker Meetings:   Marland Kitchen Marital Status:     Review of Systems  Constitutional: Negative.   HENT: Negative.   Eyes: Negative.   Respiratory: Negative.   Cardiovascular: Negative.   Gastrointestinal: Negative.   Genitourinary: Negative.   Musculoskeletal: Positive for back pain.  Neurological: Negative.   Psychiatric/Behavioral: Negative.      Objective:  BP (!) 100/52   Pulse 89   Temp 98.6 F (37 C)   Resp 16   Ht 5\' 11"  (1.803 m)   Wt 278 lb (126.1 kg)   SpO2 97%   BMI 38.77 kg/m   BP/Weight 05/17/2020 04/19/2020 03/11/2020  Systolic BP 100 120 110  Diastolic BP 52 74 50  Wt. (Lbs) 278 274.8 270  BMI 38.77 38.33 37.66    Physical Exam Vitals reviewed.  Constitutional:      Appearance: Normal appearance. He is obese.  HENT:     Head: Normocephalic.     Right Ear: Tympanic membrane, ear canal and external ear normal.     Left Ear: Tympanic membrane, ear canal and external ear normal.     Mouth/Throat:     Mouth: Mucous membranes are moist.  Eyes:     Extraocular Movements: Extraocular movements intact.     Conjunctiva/sclera: Conjunctivae normal.     Pupils: Pupils are equal, round, and reactive to light.  Cardiovascular:     Rate and Rhythm: Normal rate and regular rhythm.     Pulses: Normal pulses.     Heart sounds: Normal heart sounds.  Pulmonary:     Effort: Pulmonary effort is normal.     Breath sounds: Normal breath sounds.  Abdominal:      General: Abdomen is flat. Bowel sounds are normal.     Palpations: Abdomen is soft.  Musculoskeletal:        General: Normal range of motion.     Cervical back: Normal range of motion and neck supple.  Skin:    General: Skin is warm.     Capillary Refill: Capillary refill takes less than 2 seconds.  Neurological:     General: No focal deficit present.     Mental Status: He is alert. Mental status is at baseline.       Lab Results  Component Value Date   WBC 5.3 03/11/2020   HGB 13.9 03/11/2020   HCT 42.0 03/11/2020   PLT 198 03/11/2020   GLUCOSE 114 (H) 03/11/2020   CHOL 108 03/11/2020   TRIG 191 (H) 03/11/2020   HDL 30 (L) 03/11/2020   LDLCALC 47 03/11/2020   ALT 27 03/11/2020   AST 21 03/11/2020   NA 138 03/11/2020   K 5.4 (H) 03/11/2020   CL 112 (H) 03/11/2020   CREATININE 1.68 (H) 03/11/2020   BUN 36 (H) 03/11/2020   CO2 13 (L) 03/11/2020   TSH 1.89 09/07/2019   HGBA1C 6.2 (A) 04/19/2020      Assessment & Plan:   1. Encounter for examination required by Department of Transportation (DOT)  DOT exam performed and forms completed for one year       Follow-up: No follow-ups on file.  An After Visit Summary was printed and given to the patient.  [facility] Bull[facility] [phone]

## 2020-05-31 ENCOUNTER — Other Ambulatory Visit: Payer: Self-pay | Admitting: Specialist

## 2020-05-31 ENCOUNTER — Telehealth: Payer: Self-pay | Admitting: Nurse Practitioner

## 2020-05-31 DIAGNOSIS — M5417 Radiculopathy, lumbosacral region: Secondary | ICD-10-CM | POA: Insufficient documentation

## 2020-05-31 DIAGNOSIS — G8929 Other chronic pain: Secondary | ICD-10-CM

## 2020-05-31 NOTE — Telephone Encounter (Signed)
Phone call to patient to verify medication list and allergies for myelogram procedure. Pt aware he will not need to hold any medications for this procedure. Pre and post procedure instructions reviewed with pt. Pt verbalized understanding.  

## 2020-06-07 ENCOUNTER — Ambulatory Visit
Admission: RE | Admit: 2020-06-07 | Discharge: 2020-06-07 | Disposition: A | Discharge status: Home / Self Care | Payer: 59 | Source: Ambulatory Visit | Attending: Specialist | Admitting: Specialist

## 2020-06-07 DIAGNOSIS — M545 Low back pain, unspecified: Secondary | ICD-10-CM

## 2020-06-07 MED ORDER — DIAZEPAM 5 MG PO TABS
5.0000 mg | ORAL_TABLET | Freq: Once | ORAL | Status: AC
  Administered 2020-06-07: 5 mg via ORAL

## 2020-06-07 MED ORDER — ONDANSETRON HCL 4 MG/2ML IJ SOLN: 4.0000 mg | Freq: Four times a day (QID) | INTRAMUSCULAR | Status: DC | PRN

## 2020-06-07 MED ORDER — IOPAMIDOL (ISOVUE-M 200) INJECTION 41%
18.0000 mL | Freq: Once | INTRAMUSCULAR | Status: AC
  Administered 2020-06-07: 18 mL via INTRATHECAL

## 2020-06-07 NOTE — Discharge Instructions (Signed)

## 2020-06-10 ENCOUNTER — Other Ambulatory Visit: Payer: Self-pay | Admitting: Legal Medicine

## 2020-06-10 ENCOUNTER — Telehealth: Payer: Self-pay

## 2020-06-10 DIAGNOSIS — E291 Testicular hypofunction: Secondary | ICD-10-CM

## 2020-06-10 MED ORDER — "SYRINGE 25G X 1-1/2"" 3 ML MISC": 1.0000 mL | 0 refills | Status: DC

## 2020-06-10 NOTE — Telephone Encounter (Signed)
Patient would like syringe and needles to administer testosterone from home

## 2020-06-22 ENCOUNTER — Other Ambulatory Visit: Payer: Self-pay

## 2020-06-22 ENCOUNTER — Ambulatory Visit (INDEPENDENT_AMBULATORY_CARE_PROVIDER_SITE_OTHER): Payer: 59

## 2020-06-22 DIAGNOSIS — E291 Testicular hypofunction: Secondary | ICD-10-CM

## 2020-06-22 MED ORDER — TESTOSTERONE CYPIONATE 200 MG/ML IM SOLN
200.0000 mg | INTRAMUSCULAR | Status: DC
  Administered 2020-06-22: 200 mg via INTRAMUSCULAR

## 2020-06-22 NOTE — Progress Notes (Signed)
   Testosterone injection given per order, patient tolerated well.   [name], LPN 3:74 PM

## 2020-07-07 ENCOUNTER — Other Ambulatory Visit: Payer: Self-pay | Admitting: Legal Medicine

## 2020-07-12 ENCOUNTER — Ambulatory Visit: Payer: 59 | Admitting: Legal Medicine

## 2020-07-20 ENCOUNTER — Other Ambulatory Visit: Payer: Self-pay | Admitting: Legal Medicine

## 2020-07-20 ENCOUNTER — Ambulatory Visit: Payer: 59 | Admitting: Legal Medicine

## 2020-07-27 ENCOUNTER — Encounter: Payer: Self-pay | Admitting: Legal Medicine

## 2020-07-27 ENCOUNTER — Other Ambulatory Visit: Payer: Self-pay

## 2020-07-27 ENCOUNTER — Ambulatory Visit: Payer: 59 | Admitting: Legal Medicine

## 2020-07-27 VITALS — BP 120/60 | HR 76 | Temp 98.7°F | Resp 16 | Ht 71.0 in | Wt 276.0 lb

## 2020-07-27 DIAGNOSIS — E7800 Pure hypercholesterolemia, unspecified: Secondary | ICD-10-CM | POA: Diagnosis not present

## 2020-07-27 DIAGNOSIS — I10 Essential (primary) hypertension: Secondary | ICD-10-CM | POA: Diagnosis not present

## 2020-07-27 DIAGNOSIS — Z23 Encounter for immunization: Secondary | ICD-10-CM

## 2020-07-27 DIAGNOSIS — N400 Enlarged prostate without lower urinary tract symptoms: Secondary | ICD-10-CM

## 2020-07-27 DIAGNOSIS — E1169 Type 2 diabetes mellitus with other specified complication: Secondary | ICD-10-CM

## 2020-07-27 DIAGNOSIS — E669 Obesity, unspecified: Secondary | ICD-10-CM

## 2020-07-27 DIAGNOSIS — E291 Testicular hypofunction: Secondary | ICD-10-CM | POA: Diagnosis not present

## 2020-07-27 NOTE — Progress Notes (Signed)
Subjective:  Patient ID: Brent Duarte, male    DOB: August 25, 1956  Age: 64 y.o. MRN: [id number]  Chief Complaint  Patient presents with  . Hyperlipidemia  . Hypertension  . Diabetes    HPI: chronic visit  Patient present with type 2 diabetes.  Specifically, this is type 2, noninsulin requiring diabetes, complicated by hypertension and hyperlipidemia.  Compliance with treatment has been good; patient take medicines as directed, maintains diet and exercise regimen, follows up as directed, and is keeping glucose diary.  Date of  diagnosis 2010.  Depression screen has been performed.Tobacco screen nonsmoker. Current medicines for diabetes jardiance, prandin, metformin.  Patient is on lisionopril for renal protection and atorvastatin for cholesterol control.  Patient performs foot exams daily and last ophthalmologic exam was none.  Patient presents for follow up of hypertension.  Patient tolerating lisinopril well with side effects.  Patient was diagnosed with hypertension 2010 so has been treated for hypertension for 10 years.Patient is working on maintaining diet and exercise regimen and follows up as directed. Complication include none.  Patient presents with hyperlipidemia.  Compliance with treatment has been good; patient takes medicines as directed, maintains low cholesterol diet, follows up as directed, and maintains exercise regimen.  Patient is using atorvastatin without problems.   Current Outpatient Medications on File Prior to Visit  Medication Sig Dispense Refill  . allopurinol (ZYLOPRIM) 300 MG tablet TAKE 1 TABLET BY MOUTH AT BEDTIME 90 tablet 2  . atorvastatin (LIPITOR) 40 MG tablet TAKE 1 TABLET BY MOUTH EVERY DAY 90 tablet 2  . B-D 3CC LUER-LOK SYR 25GX1" 25G X 1" 3 ML MISC USE 1 ML BY DOES NOT APPLY ROUTE EVERY 14 (FOURTEEN) DAYS. ( 1 AND 1/2 INCH ON BACKORDER)    . colesevelam (WELCHOL) 625 MG tablet TAKE 3 TABLETS (1,875 MG TOTAL) BY MOUTH DAILY. 540 tablet 1  . Dupilumab  (DUPIXENT) 300 MG/2ML SOSY Inject 300 mg into the skin every 14 (fourteen) days.    . empagliflozin (JARDIANCE) 25 MG TABS tablet Take 25 mg by mouth daily before breakfast. 90 tablet 3  . gabapentin (NEURONTIN) 300 MG capsule Take 300 mg by mouth as needed.     Marland Kitchen glucose blood (ONETOUCH VERIO) test strip 1 each by Other route daily. Use as instructed 100 each 3  . lisinopril (ZESTRIL) 20 MG tablet TAKE 1 TABLET BY MOUTH EVERY DAY AS DIRECTED 90 tablet 2  . metFORMIN (GLUCOPHAGE-XR) 500 MG 24 hr tablet TAKE 2 TABLETS BY MOUTH EVERY DAY WITH BREAKFAST 180 tablet 3  . omega-3 acid ethyl esters (LOVAZA) 1 g capsule TAKE 2 CAPSULES BY MOUTH TWICE A DAY 360 capsule 2  . oxyCODONE-acetaminophen (PERCOCET) 10-325 MG tablet Take 1 tablet by mouth every 4 (four) hours as needed for pain. 60 tablet 0  . repaglinide (PRANDIN) 2 MG tablet TAKE 1 TABLET (2 MG TOTAL) BY MOUTH 3 (THREE) TIMES DAILY BEFORE MEALS. 270 tablet 3  . Syringe/Needle, Disp, (SYRINGE 3CC/25GX1-1/2") 25G X 1-1/2" 3 ML MISC 1 mL by Does not apply route every 14 (fourteen) days. 100 each 0  . testosterone cypionate (DEPOTESTOSTERONE CYPIONATE) 200 MG/ML injection INJECT INTRAMUSCULARLY ONCE A MONTH 3 mL 0  . PRESCRIPTION MEDICATION Apply 1 application topically 2 (two) times daily. Calcitriol ointment 45mcg/g. [facility] [place]. (Patient not taking: Reported on 07/27/2020)     No current facility-administered medications on file prior to visit.   Past Medical History:  Diagnosis Date  . Arthritis   .  BPH without obstruction/lower urinary tract symptoms 02/01/2020  . History of chicken pox   . History of kidney stones   . History of measles   . History of mumps   . Hyperlipidemia   . Idiopathic gout of right knee 02/01/2020  . Imbalance    secondary to back issues  . Multiple gastric ulcers   . Numbness and tingling of both legs   . Pneumonia   . Psoriasis vulgaris 02/01/2020  . Recurrent falls    Past Surgical  History:  Procedure Laterality Date  . bicep tear     right lower arm related to overgrowth of bone as stated per pt   . KNEE ARTHROSCOPY     bilat  . lithrotripsy     for kidney stones  . LUMBAR LAMINECTOMY/DECOMPRESSION MICRODISCECTOMY N/A 09/07/2015   Procedure: MICRO LUMBAR DECOMPRESSION L4 - L5, REMOVAL OF SYNOVIAL CYST L4-L5;  Surgeon: Jene EveryJeffrey Beane, MD;  Location: [facility];  Service: Orthopedics;  Laterality: N/A;  . URETERAL STENT PLACEMENT     secondary to kidney stones/pt states had laser surgery prior     Family History  Problem Relation Age of Onset  . Lung cancer Mother   . Lung cancer Father   . Diabetes Neg Hx    Social History   Socioeconomic History  . Marital status: Married    Spouse name: Not on file  . Number of children: Not on file  . Years of education: Not on file  . Highest education level: Not on file  Occupational History    Employer: [employer]   Tobacco Use  . Smoking status: Never Smoker  . Smokeless tobacco: Never Used  Substance and Sexual Activity  . Alcohol use: No  . Drug use: No  . Sexual activity: Yes    Partners: Female  Other Topics Concern  . Not on file  Social History Narrative  . Not on file   Social Determinants of Health   Financial Resource Strain:   . Difficulty of Paying Living Expenses: Not on file  Food Insecurity:   . Worried About Programme researcher, broadcasting/film/videounning Out of Food in the Last Year: Not on file  . Ran Out of Food in the Last Year: Not on file  Transportation Needs:   . Lack of Transportation (Medical): Not on file  . Lack of Transportation (Non-Medical): Not on file  Physical Activity:   . Days of Exercise per Week: Not on file  . Minutes of Exercise per Session: Not on file  Stress:   . Feeling of Stress : Not on file  Social Connections:   . Frequency of Communication with Friends and Family: Not on file  . Frequency of Social Gatherings with Friends and Family: Not on file  . Attends Religious Services: Not on  file  . Active Member of Clubs or Organizations: Not on file  . Attends BankerClub or Organization Meetings: Not on file  . Marital Status: Not on file    Review of Systems  Constitutional: Negative.   HENT: Negative.   Eyes: Negative.   Respiratory: Negative for cough, shortness of breath and stridor.   Cardiovascular: Negative for chest pain, palpitations and leg swelling.  Gastrointestinal: Negative.   Genitourinary: Negative.   Musculoskeletal: Negative.   Neurological: Negative.   Psychiatric/Behavioral: Negative.      Objective:  BP 120/60   Pulse 76   Temp 98.7 F (37.1 C)   Resp 16   Ht 5\' 11"  (1.803 m)  Wt 276 lb (125.2 kg)   SpO2 96%   BMI 38.49 kg/m   BP/Weight 07/27/2020 06/07/2020 05/17/2020  Systolic BP 120 132 100  Diastolic BP 60 79 52  Wt. (Lbs) 276 - 278  BMI 38.49 - 38.77    Physical Exam Vitals reviewed.  Constitutional:      Appearance: Normal appearance.  HENT:     Head: Normocephalic and atraumatic.     Right Ear: Tympanic membrane, ear canal and external ear normal.     Left Ear: Tympanic membrane, ear canal and external ear normal.     Mouth/Throat:     Mouth: Mucous membranes are moist.     Pharynx: Oropharynx is clear.  Eyes:     Extraocular Movements: Extraocular movements intact.     Conjunctiva/sclera: Conjunctivae normal.     Pupils: Pupils are equal, round, and reactive to light.  Cardiovascular:     Rate and Rhythm: Normal rate and regular rhythm.     Pulses: Normal pulses.     Heart sounds: Normal heart sounds.  Pulmonary:     Effort: Pulmonary effort is normal.     Breath sounds: Normal breath sounds.  Abdominal:     General: Abdomen is flat. Bowel sounds are normal.     Palpations: Abdomen is soft.  Musculoskeletal:        General: Normal range of motion.     Cervical back: Normal range of motion and neck supple.  Skin:    General: Skin is warm and dry.     Capillary Refill: Capillary refill takes less than 2 seconds.    Neurological:     General: No focal deficit present.     Mental Status: He is alert and oriented to person, place, and time. Mental status is at baseline.  Psychiatric:        Mood and Affect: Mood normal.     Diabetic Foot Exam - Simple   Simple Foot Form Diabetic Foot exam was performed with the following findings: Yes 07/27/2020  8:13 AM  Visual Inspection No deformities, no ulcerations, no other skin breakdown bilaterally: Yes Sensation Testing Intact to touch and monofilament testing bilaterally: Yes Pulse Check Posterior Tibialis and Dorsalis pulse intact bilaterally: Yes Comments      Lab Results  Component Value Date   WBC 5.3 03/11/2020   HGB 13.9 03/11/2020   HCT 42.0 03/11/2020   PLT 198 03/11/2020   GLUCOSE 114 (H) 03/11/2020   CHOL 108 03/11/2020   TRIG 191 (H) 03/11/2020   HDL 30 (L) 03/11/2020   LDLCALC 47 03/11/2020   ALT 27 03/11/2020   AST 21 03/11/2020   NA 138 03/11/2020   K 5.4 (H) 03/11/2020   CL 112 (H) 03/11/2020   CREATININE 1.68 (H) 03/11/2020   BUN 36 (H) 03/11/2020   CO2 13 (L) 03/11/2020   TSH 1.89 09/07/2019   HGBA1C 6.2 (A) 04/19/2020      Assessment & Plan:   1. Pure hypercholesterolemia AN INDIVIDUAL CARE PLAN for hyperlipidemia/ cholesterol was established and reinforced today.  The patient's status was assessed using clinical findings on exam, lab and other diagnostic tests. The patient's disease status was assessed based on evidence-based guidelines and found to be well controlled. MEDICATIONS were reviewed. SELF MANAGEMENT GOALS have been discussed and patient's success at attaining the goal of low cholesterol was assessed. RECOMMENDATION given include regular exercise 3 days a week and low cholesterol/low fat diet. CLINICAL SUMMARY including written plan to identify barriers unique  to the patient due to social or economic  reasons was discussed.  2. Essential hypertension Patient presents for follow up of hypertension.   Patient tolerating lisinopril well with side effects.  Patient was diagnosed with hypertension 2010 so has been treated for hypertension for 10 years.Patient is working on maintaining diet and exercise regimen and follows up as directed. Complication include none.  3. Diabetes mellitus type 2 in obese [facility]) An individual care plan for diabetes was established and reinforced today.  The patient's status was assessed using clinical findings on exam, labs and diagnostic testing. Patient success at meeting goals based on disease specific evidence-based guidelines and found to be good controlled. Medications were assessed and patient's understanding of the medical issues , including barriers were assessed. Recommend adherence to a diabetic diet, a graduated exercise program, HgbA1c level is checked quarterly, and urine microalbumin performed yearly .  Annual mono-filament sensation testing performed. Lower blood pressure and control hyperlipidemia is important. Get annual eye exams and annual flu shots and smoking cessation discussed.  Self management goals were discussed.  4. Hypogonadism in male AN INDIVIDUAL CARE PLAN for hyperglycemia was established and reinforced today.  The patient's status was assessed using clinical findings on exam, labs, and other diagnostic testing. Patient's success at meeting treatment goals based on disease specific evidence-bassed guidelines and found to be in good control. RECOMMENDATIONS include maintaining present medicines and treatment.  5. BPH without obstruction/lower urinary tract symptoms AN INDIVIDUAL CARE PLAN bph was established and reinforced today.  The patient's status was assessed using clinical findings on exam, labs, and other diagnostic testing. Patient's success at meeting treatment goals based on disease specific evidence-bassed guidelines and found to be in good control. RECOMMENDATIONS include maintaining present medicines and treatment.          Follow-up: Return in about 4 months (around 11/27/2020) for fasting.  An After Visit Summary was printed and given to the patient.  [facility] [phone]

## 2020-07-30 LAB — CBC WITH DIFFERENTIAL/PLATELET
Basophils Absolute: 0 10*3/uL (ref 0.0–0.2)
Basos: 0 %
EOS (ABSOLUTE): 0.1 10*3/uL (ref 0.0–0.4)
Eos: 2 %
Hematocrit: 46.9 % (ref 37.5–51.0)
Hemoglobin: 15.4 g/dL (ref 13.0–17.7)
Immature Grans (Abs): 0.1 10*3/uL (ref 0.0–0.1)
Immature Granulocytes: 2 %
Lymphocytes Absolute: 1 10*3/uL (ref 0.7–3.1)
Lymphs: 19 %
MCH: 31.6 pg (ref 26.6–33.0)
MCHC: 32.8 g/dL (ref 31.5–35.7)
MCV: 96 fL (ref 79–97)
Monocytes Absolute: 0.4 10*3/uL (ref 0.1–0.9)
Monocytes: 8 %
Neutrophils Absolute: 3.6 10*3/uL (ref 1.4–7.0)
Neutrophils: 69 %
Platelets: 136 10*3/uL — ABNORMAL LOW (ref 150–450)
RBC: 4.88 x10E6/uL (ref 4.14–5.80)
RDW: 14.7 % (ref 11.6–15.4)
WBC: 5.2 10*3/uL (ref 3.4–10.8)

## 2020-07-30 LAB — LIPID PANEL
Chol/HDL Ratio: 4.1 ratio (ref 0.0–5.0)
Cholesterol, Total: 119 mg/dL (ref 100–199)
HDL: 29 mg/dL — ABNORMAL LOW (ref >39)
LDL Chol Calc (NIH): 44 mg/dL (ref 0–99)
Triglycerides: 298 mg/dL — ABNORMAL HIGH (ref 0–149)
VLDL Cholesterol Cal: 46 mg/dL — ABNORMAL HIGH (ref 5–40)

## 2020-07-30 LAB — COMPREHENSIVE METABOLIC PANEL
ALT: 21 IU/L (ref 0–44)
AST: 15 IU/L (ref 0–40)
Albumin/Globulin Ratio: 2.2 (ref 1.2–2.2)
Albumin: 4.6 g/dL (ref 3.8–4.8)
Alkaline Phosphatase: 99 IU/L (ref 44–121)
BUN/Creatinine Ratio: 20 (ref 10–24)
BUN: 30 mg/dL — ABNORMAL HIGH (ref 8–27)
Bilirubin Total: 0.9 mg/dL (ref 0.0–1.2)
CO2: 19 mmol/L — ABNORMAL LOW (ref 20–29)
Calcium: 9.5 mg/dL (ref 8.6–10.2)
Chloride: 102 mmol/L (ref 96–106)
Creatinine, Ser: 1.52 mg/dL — ABNORMAL HIGH (ref 0.76–1.27)
GFR calc Af Amer: 55 mL/min/{1.73_m2} — ABNORMAL LOW (ref >59)
GFR calc non Af Amer: 48 mL/min/{1.73_m2} — ABNORMAL LOW (ref >59)
Globulin, Total: 2.1 g/dL (ref 1.5–4.5)
Glucose: 155 mg/dL — ABNORMAL HIGH (ref 65–99)
Potassium: 5 mmol/L (ref 3.5–5.2)
Sodium: 139 mmol/L (ref 134–144)
Total Protein: 6.7 g/dL (ref 6.0–8.5)

## 2020-07-30 LAB — TESTOSTERONE,FREE AND TOTAL
Testosterone, Free: 13.9 pg/mL (ref 6.6–18.1)
Testosterone: 514 ng/dL (ref 264–916)

## 2020-07-30 LAB — HEMOGLOBIN A1C
Est. average glucose Bld gHb Est-mCnc: 166 mg/dL
Hgb A1c MFr Bld: 7.4 % — ABNORMAL HIGH (ref 4.8–5.6)

## 2020-07-30 LAB — PSA: Prostate Specific Ag, Serum: 6.3 ng/mL — ABNORMAL HIGH (ref 0.0–4.0)

## 2020-07-30 LAB — TSH: TSH: 1.76 u[IU]/mL (ref 0.450–4.500)

## 2020-07-30 LAB — CARDIOVASCULAR RISK ASSESSMENT

## 2020-07-30 NOTE — Progress Notes (Signed)
Glucose 155, kidney tests stable, liver tests normal, A1c 7.4, Triglycerided high 298, Platelets low 136, rest of CBC normal, Testosterone 514 normal level, PSA 6.3 high lp

## 2020-08-24 ENCOUNTER — Other Ambulatory Visit: Payer: Self-pay

## 2020-08-24 ENCOUNTER — Ambulatory Visit: Payer: 59 | Admitting: Endocrinology

## 2020-08-24 ENCOUNTER — Encounter: Payer: Self-pay | Admitting: Endocrinology

## 2020-08-24 VITALS — BP 128/80 | HR 82 | Ht 71.0 in | Wt 284.4 lb

## 2020-08-24 DIAGNOSIS — E1122 Type 2 diabetes mellitus with diabetic chronic kidney disease: Secondary | ICD-10-CM | POA: Diagnosis not present

## 2020-08-24 DIAGNOSIS — N1831 Chronic kidney disease, stage 3a: Secondary | ICD-10-CM

## 2020-08-24 MED ORDER — REPAGLINIDE 1 MG PO TABS: 3.0000 mg | ORAL_TABLET | Freq: Three times a day (TID) | ORAL | 3 refills | Status: DC

## 2020-08-24 NOTE — Patient Instructions (Addendum)
I have sent a prescription to your pharmacy, to increase the repaglinide. Please continue the same other medications.   check your blood sugar once a day.  vary the time of day when you check, between before the 3 meals, and at bedtime.  also check if you have symptoms of your blood sugar being too high or too low.  please keep a record of the readings and bring it to your next appointment here (or you can bring the meter itself).  You can write it on any piece of paper.  please call us sooner if your blood sugar goes below 70, or if you have a lot of readings over 200.  Please come back for a follow-up appointment in 3-4 months.

## 2020-08-24 NOTE — Progress Notes (Signed)
Subjective:    Patient ID: Brent Duarte, male    DOB: [birth year], 64 y.o.   MRN: [id number]  HPI Pt returns for f/u of diabetes mellitus: DM type: 2 Dx'ed: 2017 Complications: PN and stage 3b CRI Therapy: 4 oral meds DKA: never Severe hypoglycemia: never Pancreatitis: once, in 2018 Pancreatic imaging: none available.  SDOH: He needs to control DM without insulin, due to CDL Other: he has never been on insulin; edema limits rx options; CRI limits rx options and dosages; he did not tolerate bromocriptine (n/v), trulicity (pancreatitis), or acarbose (diarrhea)  Interval history: Meter is downloaded today, and the printout is scanned into the record.  cbg varies from 181-497.  There is no trend throughout the day.  No recent steroids.   Past Medical History:  Diagnosis Date  . Arthritis   . BPH without obstruction/lower urinary tract symptoms 02/01/2020  . History of chicken pox   . History of kidney stones   . History of measles   . History of mumps   . Hyperlipidemia   . Idiopathic gout of right knee 02/01/2020  . Imbalance    secondary to back issues  . Multiple gastric ulcers   . Numbness and tingling of both legs   . Pneumonia   . Psoriasis vulgaris 02/01/2020  . Recurrent falls     Past Surgical History:  Procedure Laterality Date  . bicep tear     right lower arm related to overgrowth of bone as stated per pt   . KNEE ARTHROSCOPY     bilat  . lithrotripsy     for kidney stones  . LUMBAR LAMINECTOMY/DECOMPRESSION MICRODISCECTOMY N/A 09/07/2015   Procedure: MICRO LUMBAR DECOMPRESSION L4 - L5, REMOVAL OF SYNOVIAL CYST L4-L5;  Surgeon: Jene Every, MD;  Location: [facility];  Service: Orthopedics;  Laterality: N/A;  . URETERAL STENT PLACEMENT     secondary to kidney stones/pt states had laser surgery prior     Social History   Socioeconomic History  . Marital status: Married    Spouse name: Not on file  . Number of children: Not on file  . Years of education: Not  on file  . Highest education level: Not on file  Occupational History    Employer: [employer]   Tobacco Use  . Smoking status: Never Smoker  . Smokeless tobacco: Never Used  Substance and Sexual Activity  . Alcohol use: No  . Drug use: No  . Sexual activity: Yes    Partners: Female  Other Topics Concern  . Not on file  Social History Narrative  . Not on file   Social Determinants of Health   Financial Resource Strain:   . Difficulty of Paying Living Expenses: Not on file  Food Insecurity:   . Worried About Programme researcher, broadcasting/film/video in the Last Year: Not on file  . Ran Out of Food in the Last Year: Not on file  Transportation Needs:   . Lack of Transportation (Medical): Not on file  . Lack of Transportation (Non-Medical): Not on file  Physical Activity:   . Days of Exercise per Week: Not on file  . Minutes of Exercise per Session: Not on file  Stress:   . Feeling of Stress : Not on file  Social Connections:   . Frequency of Communication with Friends and Family: Not on file  . Frequency of Social Gatherings with Friends and Family: Not on file  . Attends Religious Services: Not on file  .  Active Member of Clubs or Organizations: Not on file  . Attends Banker Meetings: Not on file  . Marital Status: Not on file  Intimate Partner Violence:   . Fear of Current or Ex-Partner: Not on file  . Emotionally Abused: Not on file  . Physically Abused: Not on file  . Sexually Abused: Not on file    Current Outpatient Medications on File Prior to Visit  Medication Sig Dispense Refill  . allopurinol (ZYLOPRIM) 300 MG tablet TAKE 1 TABLET BY MOUTH AT BEDTIME 90 tablet 2  . atorvastatin (LIPITOR) 40 MG tablet TAKE 1 TABLET BY MOUTH EVERY DAY 90 tablet 2  . B-D 3CC LUER-LOK SYR 25GX1" 25G X 1" 3 ML MISC USE 1 ML BY DOES NOT APPLY ROUTE EVERY 14 (FOURTEEN) DAYS. ( 1 AND 1/2 INCH ON BACKORDER)    . colesevelam (WELCHOL) 625 MG tablet TAKE 3 TABLETS (1,875 MG TOTAL) BY  MOUTH DAILY. 540 tablet 1  . Dupilumab (DUPIXENT) 300 MG/2ML SOSY Inject 300 mg into the skin every 14 (fourteen) days.    . empagliflozin (JARDIANCE) 25 MG TABS tablet Take 25 mg by mouth daily before breakfast. 90 tablet 3  . gabapentin (NEURONTIN) 300 MG capsule Take 300 mg by mouth as needed.     Marland Kitchen glucose blood (ONETOUCH VERIO) test strip 1 each by Other route daily. Use as instructed 100 each 3  . lisinopril (ZESTRIL) 20 MG tablet TAKE 1 TABLET BY MOUTH EVERY DAY AS DIRECTED 90 tablet 2  . metFORMIN (GLUCOPHAGE-XR) 500 MG 24 hr tablet TAKE 2 TABLETS BY MOUTH EVERY DAY WITH BREAKFAST 180 tablet 3  . omega-3 acid ethyl esters (LOVAZA) 1 g capsule TAKE 2 CAPSULES BY MOUTH TWICE A DAY 360 capsule 2  . oxyCODONE-acetaminophen (PERCOCET) 10-325 MG tablet Take 1 tablet by mouth every 4 (four) hours as needed for pain. 60 tablet 0  . PRESCRIPTION MEDICATION Apply 1 application topically 2 (two) times daily. Calcitriol ointment 75mcg/g. [facility] with [facility] [place].     . Syringe/Needle, Disp, (SYRINGE 3CC/25GX1-1/2") 25G X 1-1/2" 3 ML MISC 1 mL by Does not apply route every 14 (fourteen) days. 100 each 0  . testosterone cypionate (DEPOTESTOSTERONE CYPIONATE) 200 MG/ML injection INJECT INTRAMUSCULARLY ONCE A MONTH 3 mL 0   No current facility-administered medications on file prior to visit.    Allergies  Allergen Reactions  . Bromocriptine Other (See Comments)    GI issues  . Ciprofloxacin Other (See Comments)    "Tendon issues"  . Trulicity [Dulaglutide] Other (See Comments)    GI issues    Family History  Problem Relation Age of Onset  . Lung cancer Mother   . Lung cancer Father   . Diabetes Neg Hx     BP 128/80   Pulse 82   Ht 5\' 11"  (1.803 m)   Wt 284 lb 6.4 oz (129 kg)   SpO2 97%   BMI 39.67 kg/m    Review of Systems He denies hypoglycemia.      Objective:   Physical Exam VITAL SIGNS:  See vs page GENERAL: no distress Pulses: dorsalis pedis intact bilat.   MSK:  no deformity of the feet CV: 2+ bilat leg edema, and bilat vv's Skin:  no ulcer on the feet.  normal color and temp on the feet. Neuro: sensation is intact to touch on the feet, but decreased from normal Ext: there is bilateral onychomycosis of the toenails.   Lab Results  Component Value  Date   HGBA1C 7.4 (H) 07/27/2020       Assessment & Plan:  Type 2 DM, with stage 3b CRI: uncontrolled.   Patient Instructions  I have sent a prescription to your pharmacy, to increase the repaglinide. Please continue the same other medications.   check your blood sugar once a day.  vary the time of day when you check, between before the 3 meals, and at bedtime.  also check if you have symptoms of your blood sugar being too high or too low.  please keep a record of the readings and bring it to your next appointment here (or you can bring the meter itself).  You can write it on any piece of paper.  please call us sooner if your blood sugar goes below 70, or if you have a lot of readings over 200.  Please come back for a follow-up appointment in 3-4 months.

## 2020-08-31 ENCOUNTER — Other Ambulatory Visit: Payer: Self-pay | Admitting: Endocrinology

## 2020-10-03 ENCOUNTER — Ambulatory Visit (INDEPENDENT_AMBULATORY_CARE_PROVIDER_SITE_OTHER): Payer: 59

## 2020-10-03 DIAGNOSIS — E291 Testicular hypofunction: Secondary | ICD-10-CM

## 2020-10-03 MED ORDER — TESTOSTERONE CYPIONATE 200 MG/ML IM SOLN
200.0000 mg | INTRAMUSCULAR | Status: DC
  Administered 2020-10-03 – 2021-11-14 (×8): 200 mg via INTRAMUSCULAR

## 2020-10-03 NOTE — Progress Notes (Signed)
   Testosterone injection given per order, patient tolerated well.   [name], LPN 3:88 AM

## 2020-10-05 ENCOUNTER — Other Ambulatory Visit: Payer: Self-pay

## 2020-10-05 ENCOUNTER — Ambulatory Visit (INDEPENDENT_AMBULATORY_CARE_PROVIDER_SITE_OTHER): Payer: 59

## 2020-10-05 DIAGNOSIS — Z23 Encounter for immunization: Secondary | ICD-10-CM

## 2020-10-05 NOTE — Progress Notes (Signed)
° °  Covid-19 Vaccination Clinic  Name:  Brent Duarte    MRN: [id number] DOB: 1956-06-30  10/05/2020  Mr. Brent Duarte was observed post Covid-19 immunization for 15 minutes without incident. He was provided with Vaccine Information Sheet and instruction to access the V-Safe system.   Mr. Brent Duarte was instructed to call 911 with any severe reactions post vaccine:  Difficulty breathing   Swelling of face and throat   A fast heartbeat   A bad rash all over body   Dizziness and weakness   Immunizations Administered    Name Date Dose VIS Date Route   Pfizer COVID-19 Vaccine 10/05/2020  8:30 AM 0.3 mL 08/03/2020 Intramuscular   Manufacturer: [employer], [employer]   Lot: [id number]   NDC: [address]-7

## 2020-10-11 ENCOUNTER — Telehealth: Payer: Self-pay

## 2020-10-11 NOTE — Telephone Encounter (Signed)
I reached patient to ask about to schedule a new colonoscopy because he is due since 2020. He mentioned that he will schedule one next year. I explained him that it is very important to schedule one. He understood.

## 2020-10-17 ENCOUNTER — Other Ambulatory Visit: Payer: Self-pay | Admitting: Legal Medicine

## 2020-10-17 DIAGNOSIS — E291 Testicular hypofunction: Secondary | ICD-10-CM

## 2020-10-20 ENCOUNTER — Other Ambulatory Visit: Payer: Self-pay | Admitting: Legal Medicine

## 2020-10-20 DIAGNOSIS — E782 Mixed hyperlipidemia: Secondary | ICD-10-CM

## 2020-11-01 ENCOUNTER — Other Ambulatory Visit: Payer: Self-pay | Admitting: Endocrinology

## 2020-11-10 ENCOUNTER — Other Ambulatory Visit: Payer: Self-pay

## 2020-11-10 ENCOUNTER — Ambulatory Visit (INDEPENDENT_AMBULATORY_CARE_PROVIDER_SITE_OTHER): Payer: 59

## 2020-11-10 DIAGNOSIS — E291 Testicular hypofunction: Secondary | ICD-10-CM

## 2020-11-10 NOTE — Progress Notes (Signed)
   Testosterone injection given per order, patient tolerated well.   [name], LPN 9:75 AM

## 2020-11-17 ENCOUNTER — Other Ambulatory Visit: Payer: Self-pay | Admitting: Legal Medicine

## 2020-11-28 ENCOUNTER — Encounter: Payer: Self-pay | Admitting: Legal Medicine

## 2020-11-28 ENCOUNTER — Other Ambulatory Visit: Payer: Self-pay

## 2020-11-28 ENCOUNTER — Ambulatory Visit: Payer: 59 | Admitting: Legal Medicine

## 2020-11-28 VITALS — BP 118/72 | HR 82 | Temp 96.2°F | Ht 71.0 in | Wt 281.2 lb

## 2020-11-28 DIAGNOSIS — N1831 Chronic kidney disease, stage 3a: Secondary | ICD-10-CM

## 2020-11-28 DIAGNOSIS — I10 Essential (primary) hypertension: Secondary | ICD-10-CM

## 2020-11-28 DIAGNOSIS — E782 Mixed hyperlipidemia: Secondary | ICD-10-CM | POA: Diagnosis not present

## 2020-11-28 DIAGNOSIS — N400 Enlarged prostate without lower urinary tract symptoms: Secondary | ICD-10-CM | POA: Diagnosis not present

## 2020-11-28 DIAGNOSIS — E1122 Type 2 diabetes mellitus with diabetic chronic kidney disease: Secondary | ICD-10-CM

## 2020-11-28 NOTE — Progress Notes (Signed)
Subjective:  Patient ID: Brent Duarte, male    DOB: Jun 29, 1956  Age: 65 y.o. MRN: [id number]  Chief Complaint  Patient presents with  . Diabetes    30M F/U    HPI: Chronic visit  Patient present with type 2 diabetes.  Specifically, this is type 2, noninsulin requiring diabetes, complicated by renal disease.  Compliance with treatment has been good; patient take medicines as directed, maintains diet and exercise regimen, follows up as directed, and is keeping glucose diary.  Date of  diagnosis 2010.  Depression screen has been performed.Tobacco screen nonsmoker. Current medicines for diabetes metformin, prandin, jardiance.  Patient is on lisinopril for renal protection and atorvastatin for cholesterol control.  Patient performs foot exams daily and last ophthalmologic exam was yes  Chronic LBP.  He saw 2 surgeons but is not a surgery candidate.. he did not sleep last night  Current Outpatient Medications on File Prior to Visit  Medication Sig Dispense Refill  . acetaminophen (TYLENOL) 500 MG tablet Tylenol Extra Strength    . allopurinol (ZYLOPRIM) 300 MG tablet TAKE 1 TABLET BY MOUTH EVERYDAY AT BEDTIME 90 tablet 2  . atorvastatin (LIPITOR) 40 MG tablet TAKE 1 TABLET BY MOUTH EVERY DAY 90 tablet 2  . B-D 3CC LUER-LOK SYR 25GX1" 25G X 1" 3 ML MISC USE 1 ML BY DOES NOT APPLY ROUTE EVERY 14 (FOURTEEN) DAYS. ( 1 AND 1/2 INCH ON BACKORDER)    . colesevelam (WELCHOL) 625 MG tablet TAKE 3 TABLETS (1,875 MG TOTAL) BY MOUTH DAILY. 540 tablet 1  . dupilumab (DUPIXENT) 300 MG/2ML prefilled syringe Inject 300 mg into the skin every 14 (fourteen) days.    Marland Kitchen gabapentin (NEURONTIN) 300 MG capsule Take 300 mg by mouth as needed.     Marland Kitchen glucose blood (ONETOUCH VERIO) test strip 1 each by Other route daily. Use as instructed 100 each 3  . HYDROcodone-acetaminophen (NORCO) 10-325 MG tablet hydrocodone 10 mg-acetaminophen 325 mg tablet  Take 1 tablet 3 times a day by oral route as needed.    Marland Kitchen JARDIANCE 25 MG  TABS tablet TAKE 1 TABLET BY MOUTH EVERY DAY BEFORE BREAKFAST 30 tablet 11  . lisinopril (ZESTRIL) 20 MG tablet TAKE 1 TABLET BY MOUTH EVERY DAY AS DIRECTED 90 tablet 2  . metFORMIN (GLUCOPHAGE-XR) 500 MG 24 hr tablet TAKE 2 TABLETS BY MOUTH EVERY DAY WITH BREAKFAST 180 tablet 1  . omega-3 acid ethyl esters (LOVAZA) 1 g capsule TAKE 2 CAPSULES BY MOUTH TWICE A DAY 360 capsule 2  . oxyCODONE-acetaminophen (PERCOCET) 10-325 MG tablet Take 1 tablet by mouth every 4 (four) hours as needed for pain. 60 tablet 0  . PRESCRIPTION MEDICATION Apply 1 application topically 2 (two) times daily. Calcitriol ointment 43mcg/g. [facility] [place].     . repaglinide (PRANDIN) 1 MG tablet Take 3 tablets (3 mg total) by mouth 3 (three) times daily before meals. 540 tablet 3  . Syringe/Needle, Disp, (SYRINGE 3CC/25GX1-1/2") 25G X 1-1/2" 3 ML MISC 1 mL by Does not apply route every 14 (fourteen) days. 100 each 0  . testosterone cypionate (DEPOTESTOSTERONE CYPIONATE) 200 MG/ML injection INJECT INTRAMUSCULARLY ONCE A MONTH 3 mL 0   Current Facility-Administered Medications on File Prior to Visit  Medication Dose Route Frequency Provider Last Rate Last Admin  . testosterone cypionate (DEPOTESTOSTERONE CYPIONATE) injection 200 mg  200 mg Intramuscular Q14 Days Abigail Miyamoto, MD   200 mg at 11/10/20 3299   Past Medical History:  Diagnosis  Date  . Arthritis   . BPH without obstruction/lower urinary tract symptoms 02/01/2020  . History of chicken pox   . History of kidney stones   . History of measles   . History of mumps   . Hyperlipidemia   . Idiopathic gout of right knee 02/01/2020  . Imbalance    secondary to back issues  . Multiple gastric ulcers   . Numbness and tingling of both legs   . Pneumonia   . Psoriasis vulgaris 02/01/2020  . Recurrent falls    Past Surgical History:  Procedure Laterality Date  . bicep tear     right lower arm related to overgrowth of bone as stated per pt   .  KNEE ARTHROSCOPY     bilat  . lithrotripsy     for kidney stones  . LUMBAR LAMINECTOMY/DECOMPRESSION MICRODISCECTOMY N/A 09/07/2015   Procedure: MICRO LUMBAR DECOMPRESSION L4 - L5, REMOVAL OF SYNOVIAL CYST L4-L5;  Surgeon: Jene Every, MD;  Location: [facility];  Service: Orthopedics;  Laterality: N/A;  . URETERAL STENT PLACEMENT     secondary to kidney stones/pt states had laser surgery prior     Family History  Problem Relation Age of Onset  . Lung cancer Mother   . Lung cancer Father   . Diabetes Neg Hx    Social History   Socioeconomic History  . Marital status: Married    Spouse name: Not on file  . Number of children: Not on file  . Years of education: Not on file  . Highest education level: Not on file  Occupational History    Employer: [employer]   Tobacco Use  . Smoking status: Never Smoker  . Smokeless tobacco: Never Used  Substance and Sexual Activity  . Alcohol use: No  . Drug use: No  . Sexual activity: Yes    Partners: Female  Other Topics Concern  . Not on file  Social History Narrative  . Not on file   Social Determinants of Health   Financial Resource Strain: Not on file  Food Insecurity: Not on file  Transportation Needs: Not on file  Physical Activity: Not on file  Stress: Not on file  Social Connections: Not on file    Review of Systems  Constitutional: Negative for activity change, appetite change and fatigue.  HENT: Negative for congestion, ear pain and sore throat.   Eyes: Negative for visual disturbance.  Respiratory: Negative for cough and shortness of breath.   Cardiovascular: Negative for chest pain.  Gastrointestinal: Negative for abdominal pain, constipation, diarrhea, nausea and vomiting.  Endocrine: Negative for polyuria.  Genitourinary: Negative for dysuria, frequency and urgency.  Musculoskeletal: Positive for back pain. Negative for arthralgias and myalgias.  Skin: Negative.   Neurological: Positive for weakness (legs).  Negative for dizziness and headaches.  Psychiatric/Behavioral: Negative for agitation and sleep disturbance. The patient is not nervous/anxious.      Objective:  BP 118/72 (BP Location: Left Arm, Patient Position: Sitting, Cuff Size: Large)   Pulse 82   Temp (!) 96.2 F (35.7 C) (Temporal)   Ht 5\' 11"  (1.803 m)   Wt 281 lb 3.2 oz (127.6 kg)   SpO2 96%   BMI 39.22 kg/m   BP/Weight 11/28/2020 08/24/2020 07/27/2020  Systolic BP 118 128 120  Diastolic BP 72 80 60  Wt. (Lbs) 281.2 284.4 276  BMI 39.22 39.67 38.49    Physical Exam Vitals reviewed.  Constitutional:      Appearance: Normal appearance.  HENT:  Head: Normocephalic and atraumatic.     Right Ear: Tympanic membrane, ear canal and external ear normal.     Left Ear: Tympanic membrane, ear canal and external ear normal.     Mouth/Throat:     Mouth: Mucous membranes are moist.     Pharynx: Oropharynx is clear.  Eyes:     Extraocular Movements: Extraocular movements intact.     Conjunctiva/sclera: Conjunctivae normal.     Pupils: Pupils are equal, round, and reactive to light.  Cardiovascular:     Rate and Rhythm: Normal rate and regular rhythm.     Pulses: Normal pulses.     Heart sounds: Normal heart sounds. No murmur heard. No friction rub. No gallop.   Pulmonary:     Effort: Pulmonary effort is normal. No respiratory distress.     Breath sounds: Normal breath sounds. No wheezing.  Abdominal:     General: Abdomen is flat. Bowel sounds are normal. There is no distension.     Palpations: Abdomen is soft.     Tenderness: There is no abdominal tenderness.  Musculoskeletal:        General: Tenderness present. Normal range of motion.     Cervical back: Normal range of motion.     Comments: Chronic LBP  Skin:    General: Skin is warm.     Capillary Refill: Capillary refill takes less than 2 seconds.  Neurological:     General: No focal deficit present.     Mental Status: He is alert and oriented to person,  place, and time. Mental status is at baseline.  Psychiatric:        Mood and Affect: Mood normal.        Behavior: Behavior normal.     Diabetic Foot Exam - Simple   Simple Foot Form Diabetic Foot exam was performed with the following findings: Yes 11/28/2020  8:09 AM  Visual Inspection No deformities, no ulcerations, no other skin breakdown bilaterally: Yes Sensation Testing Intact to touch and monofilament testing bilaterally: Yes Pulse Check Posterior Tibialis and Dorsalis pulse intact bilaterally: Yes Comments      Lab Results  Component Value Date   WBC 5.2 07/27/2020   HGB 15.4 07/27/2020   HCT 46.9 07/27/2020   PLT 136 (L) 07/27/2020   GLUCOSE 155 (H) 07/27/2020   CHOL 119 07/27/2020   TRIG 298 (H) 07/27/2020   HDL 29 (L) 07/27/2020   LDLCALC 44 07/27/2020   ALT 21 07/27/2020   AST 15 07/27/2020   NA 139 07/27/2020   K 5.0 07/27/2020   CL 102 07/27/2020   CREATININE 1.52 (H) 07/27/2020   BUN 30 (H) 07/27/2020   CO2 19 (L) 07/27/2020   TSH 1.760 07/27/2020   HGBA1C 7.4 (H) 07/27/2020      Assessment & Plan:   1. Type 2 diabetes mellitus with stage 3a chronic kidney disease, without long-term current use of insulin (HCC) - Hemoglobin A1c An individual care plan for diabetes was established and reinforced today.  The patient's status was assessed using clinical findings on exam, labs and diagnostic testing. Patient success at meeting goals based on disease specific evidence-based guidelines and found to be good controlled. Medications were assessed and patient's understanding of the medical issues , including barriers were assessed. Recommend adherence to a diabetic diet, a graduated exercise program, HgbA1c level is checked quarterly, and urine microalbumin performed yearly .  Annual mono-filament sensation testing performed. Lower blood pressure and control hyperlipidemia is important. Get annual eye  exams and annual flu shots and smoking cessation discussed.   Self management goals were discussed.  2. Essential hypertension - CBC with Differential/Platelet - Comprehensive metabolic panel An individual hypertension care plan was established and reinforced today.  The patient's status was assessed using clinical findings on exam and labs or diagnostic tests. The patient's success at meeting treatment goals on disease specific evidence-based guidelines and found to be well controlled. SELF MANAGEMENT: The patient and I together assessed ways to personally work towards obtaining the recommended goals. RECOMMENDATIONS: avoid decongestants found in common cold remedies, decrease consumption of alcohol, perform routine monitoring of BP with home BP cuff, exercise, reduction of dietary salt, take medicines as prescribed, try not to miss doses and quit smoking.  Regular exercise and maintaining a healthy weight is needed.  Stress reduction may help. A CLINICAL SUMMARY including written plan identify barriers to care unique to individual due to social or financial issues.  We attempt to mutually creat solutions for individual and family understanding.  3. BPH without obstruction/lower urinary tract symptoms AN INDIVIDUAL CARE PLAN BPH was established and reinforced today.  The patient's status was assessed using clinical findings on exam, labs, and other diagnostic testing. Patient's success at meeting treatment goals based on disease specific evidence-bassed guidelines and found to be in good control. RECOMMENDATIONS include maintaining present medicines and treatment.  4. Mixed hyperlipidemia - Lipid panel AN INDIVIDUAL CARE PLAN for hyperlipidemia/ cholesterol was established and reinforced today.  The patient's status was assessed using clinical findings on exam, lab and other diagnostic tests. The patient's disease status was assessed based on evidence-based guidelines and found to be well controlled. MEDICATIONS were reviewed. SELF MANAGEMENT GOALS have been  discussed and patient's success at attaining the goal of low cholesterol was assessed. RECOMMENDATION given include regular exercise 3 days a week and low cholesterol/low fat diet. CLINICAL SUMMARY including written plan to identify barriers unique to the patient due to social or economic  reasons was discussed.      Orders Placed This Encounter  Procedures  . CBC with Differential/Platelet  . Comprehensive metabolic panel  . Hemoglobin A1c  . Lipid panel      I spent 30 minutes dedicated to the care of this patient on the date of this encounter to include face-to-face time with the patient, as well as: review  Follow-up: Return in about 4 months (around 03/28/2021) for fasting.  An After Visit Summary was printed and given to the patient.  Brent BullaLawrence Cristle Jared, MD [facility] [phone]

## 2020-11-29 LAB — CBC WITH DIFFERENTIAL/PLATELET
Basophils Absolute: 0 10*3/uL (ref 0.0–0.2)
Basos: 0 %
EOS (ABSOLUTE): 0.1 10*3/uL (ref 0.0–0.4)
Eos: 2 %
Hematocrit: 50.3 % (ref 37.5–51.0)
Hemoglobin: 16.8 g/dL (ref 13.0–17.7)
Immature Grans (Abs): 0 10*3/uL (ref 0.0–0.1)
Immature Granulocytes: 1 %
Lymphocytes Absolute: 1.2 10*3/uL (ref 0.7–3.1)
Lymphs: 23 %
MCH: 31.8 pg (ref 26.6–33.0)
MCHC: 33.4 g/dL (ref 31.5–35.7)
MCV: 95 fL (ref 79–97)
Monocytes Absolute: 0.4 10*3/uL (ref 0.1–0.9)
Monocytes: 8 %
Neutrophils Absolute: 3.4 10*3/uL (ref 1.4–7.0)
Neutrophils: 66 %
Platelets: 126 10*3/uL — ABNORMAL LOW (ref 150–450)
RBC: 5.28 x10E6/uL (ref 4.14–5.80)
RDW: 15.6 % — ABNORMAL HIGH (ref 11.6–15.4)
WBC: 5.2 10*3/uL (ref 3.4–10.8)

## 2020-11-29 LAB — COMPREHENSIVE METABOLIC PANEL
ALT: 24 IU/L (ref 0–44)
AST: 17 IU/L (ref 0–40)
Albumin/Globulin Ratio: 2.7 — ABNORMAL HIGH (ref 1.2–2.2)
Albumin: 4.6 g/dL (ref 3.8–4.8)
Alkaline Phosphatase: 119 IU/L (ref 44–121)
BUN/Creatinine Ratio: 23 (ref 10–24)
BUN: 34 mg/dL — ABNORMAL HIGH (ref 8–27)
Bilirubin Total: 0.7 mg/dL (ref 0.0–1.2)
CO2: 19 mmol/L — ABNORMAL LOW (ref 20–29)
Calcium: 9.5 mg/dL (ref 8.6–10.2)
Chloride: 101 mmol/L (ref 96–106)
Creatinine, Ser: 1.51 mg/dL — ABNORMAL HIGH (ref 0.76–1.27)
GFR calc Af Amer: 55 mL/min/{1.73_m2} — ABNORMAL LOW (ref >59)
GFR calc non Af Amer: 48 mL/min/{1.73_m2} — ABNORMAL LOW (ref >59)
Globulin, Total: 1.7 g/dL (ref 1.5–4.5)
Glucose: 259 mg/dL — ABNORMAL HIGH (ref 65–99)
Potassium: 5.3 mmol/L — ABNORMAL HIGH (ref 3.5–5.2)
Sodium: 137 mmol/L (ref 134–144)
Total Protein: 6.3 g/dL (ref 6.0–8.5)

## 2020-11-29 LAB — LIPID PANEL
Chol/HDL Ratio: 7.8 ratio — ABNORMAL HIGH (ref 0.0–5.0)
Cholesterol, Total: 188 mg/dL (ref 100–199)
HDL: 24 mg/dL — ABNORMAL LOW (ref >39)
Triglycerides: 966 mg/dL (ref 0–149)

## 2020-11-29 LAB — HEMOGLOBIN A1C
Est. average glucose Bld gHb Est-mCnc: 235 mg/dL
Hgb A1c MFr Bld: 9.8 % — ABNORMAL HIGH (ref 4.8–5.6)

## 2020-11-29 LAB — CARDIOVASCULAR RISK ASSESSMENT

## 2020-11-29 NOTE — Progress Notes (Signed)
No anemia, platelets remain low, glucose 259, kidney tests stable potassium high 5.3, A1c 9.8 very high, triglycerides very high 966- we will need to see to start insulin lp

## 2020-12-01 ENCOUNTER — Other Ambulatory Visit: Payer: Self-pay

## 2020-12-01 ENCOUNTER — Telehealth (INDEPENDENT_AMBULATORY_CARE_PROVIDER_SITE_OTHER): Payer: 59 | Admitting: Endocrinology

## 2020-12-01 ENCOUNTER — Telehealth: Payer: Self-pay | Admitting: Endocrinology

## 2020-12-01 DIAGNOSIS — E1122 Type 2 diabetes mellitus with diabetic chronic kidney disease: Secondary | ICD-10-CM

## 2020-12-01 DIAGNOSIS — N1831 Chronic kidney disease, stage 3a: Secondary | ICD-10-CM | POA: Diagnosis not present

## 2020-12-01 NOTE — Patient Instructions (Addendum)
I have sent a prescription to your pharmacy, to increase the repaglinide. Please continue the same other medications.   check your blood sugar once a day.  vary the time of day when you check, between before the 3 meals, and at bedtime.  also check if you have symptoms of your blood sugar being too high or too low.  please keep a record of the readings and bring it to your next appointment here (or you can bring the meter itself).  You can write it on any piece of paper.  please call us sooner if your blood sugar goes below 70, or if you have a lot of readings over 200.  Please come back for a follow-up appointment in 5 weeks.

## 2020-12-01 NOTE — Progress Notes (Signed)
Subjective:    Patient ID: Brent Duarte, male    DOB: 1956-01-28, 65 y.o.   MRN: [id number]  HPI  telehealth visit today via telephone x 9 minutes.  Alternatives to telehealth are presented to this patient, and the patient agrees to the telehealth visit. Pt is advised of the cost of the visit, and agrees to this, also.   Patient is at home, and I am at the office.   Persons attending the telehealth visit: the patient and I Pt returns for f/u of diabetes mellitus: DM type: 2 Dx'ed: 2017 Complications: PN and stage 3b CRI Therapy: 4 oral meds DKA: never Severe hypoglycemia: never Pancreatitis: once, in 2018 Pancreatic imaging: none available.  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   Interval history: pt says cbg varies from 200-519.  There is no trend throughout the day.  No recent steroids. He takes meds as rx'ed.  Past Medical History:  Diagnosis Date  . Arthritis   . BPH without obstruction/lower urinary tract symptoms 02/01/2020  . History of chicken pox   . History of kidney stones   . History of measles   . History of mumps   . Hyperlipidemia   . Idiopathic gout of right knee 02/01/2020  . Imbalance    secondary to back issues  . Multiple gastric ulcers   . Numbness and tingling of both legs   . Pneumonia   . Psoriasis vulgaris 02/01/2020  . Recurrent falls     Past Surgical History:  Procedure Laterality Date  . bicep tear     right lower arm related to overgrowth of bone as stated per pt   . KNEE ARTHROSCOPY     bilat  . lithrotripsy     for kidney stones  . LUMBAR LAMINECTOMY/DECOMPRESSION MICRODISCECTOMY N/A 09/07/2015   Procedure: MICRO  LUMBAR DECOMPRESSION L4 - L5, REMOVAL OF SYNOVIAL CYST L4-L5;  Surgeon: Jene Every, MD;  Location: [facility];  Service: Orthopedics;  Laterality: N/A;  . URETERAL STENT PLACEMENT     secondary to kidney stones/pt states had laser surgery prior     Social History   Socioeconomic History  . Marital status: Married    Spouse name: Not on file  . Number of children: Not on file  . Years of education: Not on file  . Highest education level: Not on file  Occupational History    Employer: [employer]   Tobacco Use  . Smoking status: Never Smoker  . Smokeless tobacco: Never Used  Substance and Sexual Activity  . Alcohol use: No  . Drug use: No  . Sexual activity: Yes    Partners: Female  Other Topics Concern  . Not on file  Social History Narrative  . Not on file   Social Determinants of Health   Financial Resource Strain: Not on file  Food Insecurity: Not on file  Transportation Needs: Not on file  Physical Activity: Not on file  Stress: Not on file  Social Connections: Not on file  Intimate Partner Violence: Not on file    Current Outpatient Medications on File Prior to Visit  Medication Sig Dispense Refill  . acetaminophen (TYLENOL) 500 MG  tablet Tylenol Extra Strength    . allopurinol (ZYLOPRIM) 300 MG tablet TAKE 1 TABLET BY MOUTH EVERYDAY AT BEDTIME 90 tablet 2  . atorvastatin (LIPITOR) 40 MG tablet TAKE 1 TABLET BY MOUTH EVERY DAY 90 tablet 2  . B-D 3CC LUER-LOK SYR 25GX1" 25G X 1" 3 ML MISC USE 1 ML BY DOES NOT APPLY ROUTE EVERY 14 (FOURTEEN) DAYS. ( 1 AND 1/2 INCH ON BACKORDER)    . colesevelam (WELCHOL) 625 MG tablet TAKE 3 TABLETS (1,875 MG TOTAL) BY MOUTH DAILY. 540 tablet 1  . dupilumab (DUPIXENT) 300 MG/2ML prefilled syringe Inject 300 mg into the skin every 14 (fourteen) days.    Marland Kitchen gabapentin (NEURONTIN) 300 MG capsule Take 300 mg by mouth as needed.     Marland Kitchen glucose blood (ONETOUCH VERIO) test strip 1 each by Other route daily. Use as instructed 100 each 3   . HYDROcodone-acetaminophen (NORCO) 10-325 MG tablet hydrocodone 10 mg-acetaminophen 325 mg tablet  Take 1 tablet 3 times a day by oral route as needed.    Marland Kitchen JARDIANCE 25 MG TABS tablet TAKE 1 TABLET BY MOUTH EVERY DAY BEFORE BREAKFAST 30 tablet 11  . lisinopril (ZESTRIL) 20 MG tablet TAKE 1 TABLET BY MOUTH EVERY DAY AS DIRECTED 90 tablet 2  . metFORMIN (GLUCOPHAGE-XR) 500 MG 24 hr tablet TAKE 2 TABLETS BY MOUTH EVERY DAY WITH BREAKFAST 180 tablet 1  . omega-3 acid ethyl esters (LOVAZA) 1 g capsule TAKE 2 CAPSULES BY MOUTH TWICE A DAY 360 capsule 2  . oxyCODONE-acetaminophen (PERCOCET) 10-325 MG tablet Take 1 tablet by mouth every 4 (four) hours as needed for pain. 60 tablet 0  . PRESCRIPTION MEDICATION Apply 1 application topically 2 (two) times daily. Calcitriol ointment 86mcg/g. Verified with [facility] [place].     . repaglinide (PRANDIN) 1 MG tablet Take 3 tablets (3 mg total) by mouth 3 (three) times daily before meals. 540 tablet 3  . Syringe/Needle, Disp, (SYRINGE 3CC/25GX1-1/2") 25G X 1-1/2" 3 ML MISC 1 mL by Does not apply route every 14 (fourteen) days. 100 each 0  . testosterone cypionate (DEPOTESTOSTERONE CYPIONATE) 200 MG/ML injection INJECT INTRAMUSCULARLY ONCE A MONTH 3 mL 0   Current Facility-Administered Medications on File Prior to Visit  Medication Dose Route Frequency Provider Last Rate Last Admin  . testosterone cypionate (DEPOTESTOSTERONE CYPIONATE) injection 200 mg  200 mg Intramuscular Q14 Days Abigail Miyamoto, MD   200 mg at 11/10/20 6269    Allergies  Allergen Reactions  . Bromocriptine Other (See Comments)    GI issues  . Ciprofloxacin Other (See Comments)    "Tendon issues"  . Trulicity [Dulaglutide] Other (See Comments)    GI issues    Family History  Problem Relation Age of Onset  . Lung cancer Mother   . Lung cancer Father   . Diabetes Neg Hx     There were no vitals taken for this visit.   Review of Systems     Objective:   Physical  Exam    A1c=9.2%    Assessment & Plan:  Type 2 DM, with stage 3 CRI: uncontrolled. I advised insulin, but he declines, at least for now.   Patient Instructions  I have sent a prescription to your pharmacy, to increase the repaglinide. Please continue the same other medications.   check your blood sugar once a day.  vary the time of day when you check, between before the 3 meals, and at bedtime.  also check if you have  symptoms of your blood sugar being too high or too low.  please keep a record of the readings and bring it to your next appointment here (or you can bring the meter itself).  You can write it on any piece of paper.  please call us sooner if your blood sugar goes below 70, or if you have a lot of readings over 200.  Please come back for a follow-up appointment in 5 weeks.

## 2020-12-01 NOTE — Telephone Encounter (Signed)
Please advise 

## 2020-12-01 NOTE — Telephone Encounter (Signed)
Called pt and booked VV phone call at 1:15 today

## 2020-12-01 NOTE — Telephone Encounter (Signed)
Vv today please

## 2020-12-01 NOTE — Telephone Encounter (Signed)
Pt called because he wanted to let us know his A1c on Monday was 9.2. Last night when he checked his blood sugars they were 519 and this morning they are 331. Pt asks if he needs to do any changes to his meds?  Ph# [phone]

## 2020-12-08 ENCOUNTER — Other Ambulatory Visit: Payer: Self-pay

## 2020-12-08 ENCOUNTER — Ambulatory Visit (INDEPENDENT_AMBULATORY_CARE_PROVIDER_SITE_OTHER): Payer: 59

## 2020-12-08 DIAGNOSIS — E291 Testicular hypofunction: Secondary | ICD-10-CM | POA: Diagnosis not present

## 2020-12-12 ENCOUNTER — Ambulatory Visit: Payer: 59 | Admitting: Endocrinology

## 2021-01-03 ENCOUNTER — Other Ambulatory Visit: Payer: Self-pay

## 2021-01-03 ENCOUNTER — Ambulatory Visit (INDEPENDENT_AMBULATORY_CARE_PROVIDER_SITE_OTHER): Payer: 59

## 2021-01-03 DIAGNOSIS — E291 Testicular hypofunction: Secondary | ICD-10-CM

## 2021-01-03 NOTE — Progress Notes (Signed)
   Testosterone injection given per order, patient tolerated well.   Christasia Angeletti M Amaziah Ghosh, LPN 8:21 AM  

## 2021-01-04 ENCOUNTER — Ambulatory Visit: Payer: 59 | Admitting: Endocrinology

## 2021-01-04 ENCOUNTER — Other Ambulatory Visit: Payer: Self-pay

## 2021-01-04 VITALS — BP 110/60 | HR 86 | Ht 71.0 in | Wt 278.8 lb

## 2021-01-04 DIAGNOSIS — N1831 Chronic kidney disease, stage 3a: Secondary | ICD-10-CM

## 2021-01-04 DIAGNOSIS — E1122 Type 2 diabetes mellitus with diabetic chronic kidney disease: Secondary | ICD-10-CM

## 2021-01-04 LAB — POCT GLYCOSYLATED HEMOGLOBIN (HGB A1C): Hemoglobin A1C: 8.6 % — AB (ref 4.0–5.6)

## 2021-01-04 MED ORDER — REPAGLINIDE 2 MG PO TABS: 4.0000 mg | ORAL_TABLET | Freq: Three times a day (TID) | ORAL | 3 refills | Status: DC

## 2021-01-04 NOTE — Progress Notes (Signed)
Subjective:    Patient ID: Brent Duarte, male    DOB: [birth year], 65 y.o.   MRN: [id number]  HPI Pt returns for f/u of diabetes mellitus: DM type: 2 Dx'ed: 2017 Complications: PN and stage 3b CRI Therapy: 4 oral meds DKA: never Severe hypoglycemia: never Pancreatitis: once, in 2018 Pancreatic imaging: none available.  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   Interval history: He brings his meter with his cbg's which I have reviewed today.  cbg varies from 157-519.  There is no trend throughout the day.  No recent steroids. He seldom misses meds.  Past Medical History:  Diagnosis Date  . Arthritis   . BPH without obstruction/lower urinary tract symptoms 02/01/2020  . History of chicken pox   . History of kidney stones   . History of measles   . History of mumps   . Hyperlipidemia   . Idiopathic gout of right knee 02/01/2020  . Imbalance    secondary to back issues  . Multiple gastric ulcers   . Numbness and tingling of both legs   . Pneumonia   . Psoriasis vulgaris 02/01/2020  . Recurrent falls     Past Surgical History:  Procedure Laterality Date  . bicep tear     right lower arm related to overgrowth of bone as stated per pt   . KNEE ARTHROSCOPY     bilat  . lithrotripsy     for kidney stones  . LUMBAR LAMINECTOMY/DECOMPRESSION MICRODISCECTOMY N/A 09/07/2015   Procedure: MICRO LUMBAR DECOMPRESSION L4 - L5, REMOVAL OF SYNOVIAL CYST L4-L5;  Surgeon: Jene Every, MD;  Location: [facility];  Service: Orthopedics;  Laterality: N/A;  . URETERAL STENT PLACEMENT     secondary to kidney stones/pt states had laser surgery prior     Social History   Socioeconomic  History  . Marital status: Married    Spouse name: Not on file  . Number of children: Not on file  . Years of education: Not on file  . Highest education level: Not on file  Occupational History    Employer: [employer]   Tobacco Use  . Smoking status: Never Smoker  . Smokeless tobacco: Never Used  Substance and Sexual Activity  . Alcohol use: No  . Drug use: No  . Sexual activity: Yes    Partners: Female  Other Topics Concern  . Not on file  Social History Narrative  . Not on file   Social Determinants of Health   Financial Resource Strain: Not on file  Food Insecurity: Not on file  Transportation Needs: Not on file  Physical Activity: Not on file  Stress: Not on file  Social Connections: Not on file  Intimate Partner Violence: Not on file    Current Outpatient Medications on File Prior to Visit  Medication Sig Dispense Refill  . acetaminophen (TYLENOL) 500 MG tablet Tylenol Extra Strength    . allopurinol (ZYLOPRIM) 300 MG tablet TAKE 1 TABLET BY MOUTH EVERYDAY AT BEDTIME 90 tablet 2  . atorvastatin (LIPITOR) 40 MG tablet TAKE 1 TABLET BY MOUTH EVERY DAY 90 tablet 2  . B-D 3CC LUER-LOK SYR 25GX1" 25G X 1" 3 ML MISC USE 1  ML BY DOES NOT APPLY ROUTE EVERY 14 (FOURTEEN) DAYS. ( 1 AND 1/2 INCH ON BACKORDER)    . colesevelam (WELCHOL) 625 MG tablet TAKE 3 TABLETS (1,875 MG TOTAL) BY MOUTH DAILY. 540 tablet 1  . dupilumab (DUPIXENT) 300 MG/2ML prefilled syringe Inject 300 mg into the skin every 14 (fourteen) days.    Marland Kitchen gabapentin (NEURONTIN) 300 MG capsule Take 300 mg by mouth as needed.     Marland Kitchen glucose blood (ONETOUCH VERIO) test strip 1 each by Other route daily. Use as instructed 100 each 3  . HYDROcodone-acetaminophen (NORCO) 10-325 MG tablet hydrocodone 10 mg-acetaminophen 325 mg tablet  Take 1 tablet 3 times a day by oral route as needed.    Marland Kitchen JARDIANCE 25 MG TABS tablet TAKE 1 TABLET BY MOUTH EVERY DAY BEFORE BREAKFAST 30 tablet 11  . lisinopril (ZESTRIL) 20 MG  tablet TAKE 1 TABLET BY MOUTH EVERY DAY AS DIRECTED 90 tablet 2  . metFORMIN (GLUCOPHAGE-XR) 500 MG 24 hr tablet TAKE 2 TABLETS BY MOUTH EVERY DAY WITH BREAKFAST 180 tablet 1  . omega-3 acid ethyl esters (LOVAZA) 1 g capsule TAKE 2 CAPSULES BY MOUTH TWICE A DAY 360 capsule 2  . oxyCODONE-acetaminophen (PERCOCET) 10-325 MG tablet Take 1 tablet by mouth every 4 (four) hours as needed for pain. 60 tablet 0  . PRESCRIPTION MEDICATION Apply 1 application topically 2 (two) times daily. Calcitriol ointment 66mcg/g. [facility] [place].     . Syringe/Needle, Disp, (SYRINGE 3CC/25GX1-1/2") 25G X 1-1/2" 3 ML MISC 1 mL by Does not apply route every 14 (fourteen) days. 100 each 0  . testosterone cypionate (DEPOTESTOSTERONE CYPIONATE) 200 MG/ML injection INJECT INTRAMUSCULARLY ONCE A MONTH 3 mL 0   Current Facility-Administered Medications on File Prior to Visit  Medication Dose Route Frequency Provider Last Rate Last Admin  . testosterone cypionate (DEPOTESTOSTERONE CYPIONATE) injection 200 mg  200 mg Intramuscular Q14 Days Abigail Miyamoto, MD   200 mg at 01/03/21 6962    Allergies  Allergen Reactions  . Bromocriptine Other (See Comments)    GI issues  . Ciprofloxacin Other (See Comments)    "Tendon issues"  . Trulicity [Dulaglutide] Other (See Comments)    GI issues    Family History  Problem Relation Age of Onset  . Lung cancer Mother   . Lung cancer Father   . Diabetes Neg Hx     BP 110/60 (BP Location: Right Arm, Patient Position: Sitting, Cuff Size: Large)   Pulse 86   Ht 5\' 11"  (1.803 m)   Wt 278 lb 12.8 oz (126.5 kg)   SpO2 97%   BMI 38.88 kg/m    Review of Systems     Objective:   Physical Exam VITAL SIGNS:  See vs page GENERAL: no distress Pulses: dorsalis pedis intact bilat.   MSK: no deformity of the feet CV: 2+ bilat leg edema Skin:  no ulcer on the feet.  normal color and temp on the feet. Neuro: sensation is intact to touch on the feet Ext:  there is bilateral onychomycosis of the toenails   Lab Results  Component Value Date   HGBA1C 8.6 (A) 01/04/2021       Assessment & Plan:  Type 2 DM, with stage 3 CRI: uncontrolled.  Patient Instructions  I have sent a prescription to your pharmacy, to increase the repaglinide. Please continue the same other medications.   Please see a weight loss specialist.  you will receive a phone call, about a  day and time for an appointment.   check your blood sugar once a day.  vary the time of day when you check, between before the 3 meals, and at bedtime.  also check if you have symptoms of your blood sugar being too high or too low.  please keep a record of the readings and bring it to your next appointment here (or you can bring the meter itself).  You can write it on any piece of paper.  please call us sooner if your blood sugar goes below 70, or if you have a lot of readings over 200.  Please come back for a follow-up appointment in 2 months.

## 2021-01-04 NOTE — Patient Instructions (Addendum)
I have sent a prescription to your pharmacy, to increase the repaglinide. Please continue the same other medications.   Please see a weight loss specialist.  you will receive a phone call, about a day and time for an appointment.   check your blood sugar once a day.  vary the time of day when you check, between before the 3 meals, and at bedtime.  also check if you have symptoms of your blood sugar being too high or too low.  please keep a record of the readings and bring it to your next appointment here (or you can bring the meter itself).  You can write it on any piece of paper.  please call us sooner if your blood sugar goes below 70, or if you have a lot of readings over 200.  Please come back for a follow-up appointment in 2 months.

## 2021-01-23 ENCOUNTER — Other Ambulatory Visit: Payer: Self-pay | Admitting: Legal Medicine

## 2021-01-23 DIAGNOSIS — E291 Testicular hypofunction: Secondary | ICD-10-CM

## 2021-02-03 ENCOUNTER — Ambulatory Visit (INDEPENDENT_AMBULATORY_CARE_PROVIDER_SITE_OTHER): Payer: 59

## 2021-02-03 ENCOUNTER — Other Ambulatory Visit: Payer: Self-pay

## 2021-02-03 DIAGNOSIS — E291 Testicular hypofunction: Secondary | ICD-10-CM | POA: Diagnosis not present

## 2021-03-06 ENCOUNTER — Ambulatory Visit: Payer: 59 | Admitting: Endocrinology

## 2021-03-06 ENCOUNTER — Other Ambulatory Visit: Payer: Self-pay

## 2021-03-06 ENCOUNTER — Other Ambulatory Visit: Payer: Self-pay | Admitting: Endocrinology

## 2021-03-06 VITALS — BP 100/70 | HR 76 | Ht 71.0 in | Wt 277.0 lb

## 2021-03-06 DIAGNOSIS — R609 Edema, unspecified: Secondary | ICD-10-CM | POA: Insufficient documentation

## 2021-03-06 DIAGNOSIS — E1122 Type 2 diabetes mellitus with diabetic chronic kidney disease: Secondary | ICD-10-CM

## 2021-03-06 DIAGNOSIS — N1831 Chronic kidney disease, stage 3a: Secondary | ICD-10-CM | POA: Diagnosis not present

## 2021-03-06 DIAGNOSIS — R600 Localized edema: Secondary | ICD-10-CM

## 2021-03-06 LAB — BRAIN NATRIURETIC PEPTIDE: Pro B Natriuretic peptide (BNP): 11 pg/mL (ref 0.0–100.0)

## 2021-03-06 LAB — BASIC METABOLIC PANEL
BUN: 39 mg/dL — ABNORMAL HIGH (ref 6–23)
CO2: 22 mEq/L (ref 19–32)
Calcium: 10.1 mg/dL (ref 8.4–10.5)
Chloride: 104 mEq/L (ref 96–112)
Creatinine, Ser: 1.44 mg/dL (ref 0.40–1.50)
GFR: 51.01 mL/min — ABNORMAL LOW (ref >60.00)
Glucose, Bld: 207 mg/dL — ABNORMAL HIGH (ref 70–99)
Potassium: 4.8 mEq/L (ref 3.5–5.1)
Sodium: 136 mEq/L (ref 135–145)

## 2021-03-06 LAB — POCT GLYCOSYLATED HEMOGLOBIN (HGB A1C): Hemoglobin A1C: 9.2 % — AB (ref 4.0–5.6)

## 2021-03-06 MED ORDER — PIOGLITAZONE HCL 45 MG PO TABS: 45.0000 mg | ORAL_TABLET | Freq: Every day | ORAL | 3 refills | Status: DC

## 2021-03-06 NOTE — Progress Notes (Signed)
   Subjective:    Patient ID: Brent Duarte, male    DOB: Aug 16, 1956, 65 y.o.   MRN: [id number]  HPI Pt returns for f/u of diabetes mellitus: DM type: 2 Dx'ed: 2017 Complications: PN and stage 3b CRI.   Therapy: 4 oral meds DKA: never Severe hypoglycemia: never Pancreatitis: once, in 2018 Pancreatic imaging: none available.  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   Other: he has never been on insulin, but he knows how; edema limits rx options; CRI limits rx options and dosages; he did not tolerate bromocriptine (n/v), trulicity (pancreatitis), or acarbose (diarrhea); he declines bariatric surgery.   Interval history: no cbg record, but states cbg's are in the 200's.  He seldom misses meds.  He says wife's illness limits rx of DM.  This also caused him to be unable to see med weight manag.     Review of Systems Denies sob.      Objective:   Physical Exam VITAL SIGNS:  See vs page GENERAL: no distress Pulses: dorsalis pedis intact bilat.   MSK: no deformity of the feet CV: 2+ bilat leg edema Skin:  no ulcer on the feet.  normal color and temp on the feet.   Neuro: sensation is intact to touch on the feet.   Ext: there is bilateral onychomycosis of the toenails.    Lab Results  Component Value Date   HGBA1C 9.2 (A) 03/06/2021   Lab Results  Component Value Date   CREATININE 1.51 (H) 11/28/2020   BUN 34 (H) 11/28/2020   NA 137 11/28/2020   K 5.3 (H) 11/28/2020   CL 101 11/28/2020   CO2 19 (L) 11/28/2020   Lab Results  Component Value Date   TSH 1.760 07/27/2020       Assessment & Plan:  Insulin-requiring type 2 DM: uncontrolled Edema: this is a relative contraindication to pioglitazone, but non-insulin options are limited. CRI: recheck today, along with BNP

## 2021-03-06 NOTE — Patient Instructions (Addendum)
Blood tests are requested for you today.  We'll let you know about the results.  This will tell us if you can take pioglitazone.  I hesitate, due to your leg swelling, but your non-insulin options are limited.   check your blood sugar once a day.  vary the time of day when you check, between before the 3 meals, and at bedtime.  also check if you have symptoms of your blood sugar being too high or too low.  please keep a record of the readings and bring it to your next appointment here (or you can bring the meter itself).  You can write it on any piece of paper.  please call us sooner if your blood sugar goes below 70, or if you have a lot of readings over 200.  Please come back for a follow-up appointment in 2 months.

## 2021-03-08 ENCOUNTER — Ambulatory Visit: Payer: 59

## 2021-03-08 DIAGNOSIS — E291 Testicular hypofunction: Secondary | ICD-10-CM

## 2021-03-08 NOTE — Progress Notes (Signed)
Patient came in today for her testosterone 200 mg  injection. Injection was given on the left side, was tolerated well by patient.

## 2021-03-28 ENCOUNTER — Encounter: Payer: Self-pay | Admitting: Legal Medicine

## 2021-03-28 ENCOUNTER — Other Ambulatory Visit: Payer: Self-pay

## 2021-03-28 ENCOUNTER — Ambulatory Visit: Payer: 59 | Admitting: Legal Medicine

## 2021-03-28 VITALS — BP 118/72 | HR 68 | Temp 97.9°F | Resp 20 | Ht 71.0 in | Wt 279.0 lb

## 2021-03-28 DIAGNOSIS — E782 Mixed hyperlipidemia: Secondary | ICD-10-CM

## 2021-03-28 DIAGNOSIS — N1831 Chronic kidney disease, stage 3a: Secondary | ICD-10-CM

## 2021-03-28 DIAGNOSIS — Z6838 Body mass index (BMI) 38.0-38.9, adult: Secondary | ICD-10-CM

## 2021-03-28 DIAGNOSIS — N400 Enlarged prostate without lower urinary tract symptoms: Secondary | ICD-10-CM

## 2021-03-28 DIAGNOSIS — E291 Testicular hypofunction: Secondary | ICD-10-CM | POA: Diagnosis not present

## 2021-03-28 DIAGNOSIS — I10 Essential (primary) hypertension: Secondary | ICD-10-CM | POA: Diagnosis not present

## 2021-03-28 DIAGNOSIS — E1122 Type 2 diabetes mellitus with diabetic chronic kidney disease: Secondary | ICD-10-CM

## 2021-03-28 DIAGNOSIS — Z79891 Long term (current) use of opiate analgesic: Secondary | ICD-10-CM

## 2021-03-28 NOTE — Progress Notes (Signed)
Established Patient Office Visit  Subjective:  Patient ID: Brent Duarte, male    DOB: 1956/06/11  Age: 65 y.o. MRN: [id number]  CC:  Chief Complaint  Patient presents with   Hypertension    HPI Brent Duarte presents for chronic visit  Patient presents for follow up of hypertension.  Patient tolerating lisinopril well with side effects.  Patient was diagnosed with hypertension 2010 so has been treated for hypertension for 10 years.Patient is working on maintaining diet and exercise regimen and follows up as directed. Complication include none.   Patient present with type 2 diabetes.  Specifically, this is type 2, noninsulin requiring diabetes, complicated by renal disease.  Compliance with treatment has been good; patient take medicines as directed, maintains diet and exercise regimen, follows up as directed, and is keeping glucose diary.  Date of  diagnosis 2010.  Depression screen has been performed.Tobacco screen nonsmoker. Current medicines for diabetes jardiance, prandin, metfformin.  Patient is on lisinopril for renal protection and atorvastatin for cholesterol control.  Patient performs foot exams daily and last ophthalmologic exam was yes.   Patient presents with hyperlipidemia.  Compliance with treatment has been good; patient takes medicines as directed, maintains low cholesterol diet, follows up as directed, and maintains exercise regimen.  Patient is using atorvastatin without problems.   Past Medical History:  Diagnosis Date   Arthritis    BPH without obstruction/lower urinary tract symptoms 02/01/2020   History of chicken pox    History of kidney stones    History of measles    History of mumps    Hyperlipidemia    Idiopathic gout of right knee 02/01/2020   Imbalance    secondary to back issues   Multiple gastric ulcers    Numbness and tingling of both legs    Pneumonia    Psoriasis vulgaris 02/01/2020   Recurrent falls     Past Surgical History:  Procedure  Laterality Date   bicep tear     right lower arm related to overgrowth of bone as stated per pt    KNEE ARTHROSCOPY     bilat   lithrotripsy     for kidney stones   LUMBAR LAMINECTOMY/DECOMPRESSION MICRODISCECTOMY N/A 09/07/2015   Procedure: MICRO LUMBAR DECOMPRESSION L4 - L5, REMOVAL OF SYNOVIAL CYST L4-L5;  Surgeon: Jene Every, MD;  Location: [facility];  Service: Orthopedics;  Laterality: N/A;   URETERAL STENT PLACEMENT     secondary to kidney stones/pt states had laser surgery prior     Family History  Problem Relation Age of Onset   Lung cancer Mother    Lung cancer Father    Diabetes Neg Hx     Social History   Socioeconomic History   Marital status: Married    Spouse name: Not on file   Number of children: Not on file   Years of education: Not on file   Highest education level: Not on file  Occupational History    Employer: [employer]   Tobacco Use   Smoking status: Never   Smokeless tobacco: Never  Substance and Sexual Activity   Alcohol use: No   Drug use: No   Sexual activity: Yes    Partners: Female  Other Topics Concern   Not on file  Social History Narrative   Not on file   Social Determinants of Health   Financial Resource Strain: Not on file  Food Insecurity: Not on file  Transportation Needs: Not on file  Physical  Activity: Not on file  Stress: Not on file  Social Connections: Not on file  Intimate Partner Violence: Not on file    Outpatient Medications Prior to Visit  Medication Sig Dispense Refill   acetaminophen (TYLENOL) 500 MG tablet Tylenol Extra Strength     allopurinol (ZYLOPRIM) 300 MG tablet TAKE 1 TABLET BY MOUTH EVERYDAY AT BEDTIME 90 tablet 2   atorvastatin (LIPITOR) 40 MG tablet TAKE 1 TABLET BY MOUTH EVERY DAY 90 tablet 2   B-D 3CC LUER-LOK SYR 25GX1" 25G X 1" 3 ML MISC USE 1 ML BY DOES NOT APPLY ROUTE EVERY 14 (FOURTEEN) DAYS. ( 1 AND 1/2 INCH ON BACKORDER)     colesevelam (WELCHOL) 625 MG tablet TAKE 3 TABLETS (1,875 MG  TOTAL) BY MOUTH DAILY. 540 tablet 1   dupilumab (DUPIXENT) 300 MG/2ML prefilled syringe Inject 300 mg into the skin every 14 (fourteen) days.     gabapentin (NEURONTIN) 300 MG capsule Take 300 mg by mouth as needed.      glucose blood (ONETOUCH VERIO) test strip 1 each by Other route daily. Use as instructed 100 each 3   HYDROcodone-acetaminophen (NORCO) 10-325 MG tablet hydrocodone 10 mg-acetaminophen 325 mg tablet  Take 1 tablet 3 times a day by oral route as needed.     JARDIANCE 25 MG TABS tablet TAKE 1 TABLET BY MOUTH EVERY DAY BEFORE BREAKFAST 30 tablet 11   lisinopril (ZESTRIL) 20 MG tablet TAKE 1 TABLET BY MOUTH EVERY DAY AS DIRECTED 90 tablet 2   metFORMIN (GLUCOPHAGE-XR) 500 MG 24 hr tablet TAKE 2 TABLETS BY MOUTH EVERY DAY WITH BREAKFAST 180 tablet 1   omega-3 acid ethyl esters (LOVAZA) 1 g capsule TAKE 2 CAPSULES BY MOUTH TWICE A DAY 360 capsule 2   oxyCODONE-acetaminophen (PERCOCET) 10-325 MG tablet Take 1 tablet by mouth every 4 (four) hours as needed for pain. 60 tablet 0   pioglitazone (ACTOS) 45 MG tablet Take 1 tablet (45 mg total) by mouth daily. 90 tablet 3   PRESCRIPTION MEDICATION Apply 1 application topically 2 (two) times daily. Calcitriol ointment 54mcg/g. [facility] with [facility] [place].      repaglinide (PRANDIN) 2 MG tablet Take 2 tablets (4 mg total) by mouth 3 (three) times daily before meals. 540 tablet 3   Syringe/Needle, Disp, (SYRINGE 3CC/25GX1-1/2") 25G X 1-1/2" 3 ML MISC 1 mL by Does not apply route every 14 (fourteen) days. 100 each 0   testosterone cypionate (DEPOTESTOSTERONE CYPIONATE) 200 MG/ML injection INJECT INTRAMUSCULARLY ONCE A MONTH 3 mL 3   Facility-Administered Medications Prior to Visit  Medication Dose Route Frequency Provider Last Rate Last Admin   testosterone cypionate (DEPOTESTOSTERONE CYPIONATE) injection 200 mg  200 mg Intramuscular Q14 Days Abigail Miyamoto, MD   200 mg at 02/03/21 8786    Allergies  Allergen Reactions    Bromocriptine Other (See Comments)    GI issues   Ciprofloxacin Other (See Comments)    "Tendon issues"   Trulicity [Dulaglutide] Other (See Comments)    GI issues    ROS Review of Systems  Constitutional:  Negative for chills, fatigue and fever.  HENT:  Negative for congestion, ear pain and sore throat.   Respiratory:  Negative for cough and shortness of breath.   Cardiovascular:  Negative for chest pain.  Gastrointestinal:  Negative for abdominal pain, constipation, diarrhea, nausea and vomiting.  Endocrine: Negative for polydipsia, polyphagia and polyuria.  Genitourinary:  Negative for dysuria and frequency.  Musculoskeletal:  Negative for arthralgias and  myalgias.  Neurological:  Negative for dizziness and headaches.  Psychiatric/Behavioral:  Negative for dysphoric mood.        No dysphoria     Objective:    Physical Exam Vitals reviewed.  Constitutional:      Appearance: Normal appearance. He is obese.  HENT:     Head: Normocephalic and atraumatic.     Right Ear: Tympanic membrane, ear canal and external ear normal.     Left Ear: Tympanic membrane, ear canal and external ear normal.     Nose: Nose normal.     Mouth/Throat:     Mouth: Mucous membranes are moist.     Pharynx: Oropharynx is clear.  Eyes:     Extraocular Movements: Extraocular movements intact.     Conjunctiva/sclera: Conjunctivae normal.     Pupils: Pupils are equal, round, and reactive to light.  Cardiovascular:     Rate and Rhythm: Normal rate and regular rhythm.     Pulses: Normal pulses.     Heart sounds: Normal heart sounds. No murmur heard.   No gallop.  Pulmonary:     Effort: Pulmonary effort is normal. No respiratory distress.     Breath sounds: Normal breath sounds. No wheezing.  Abdominal:     General: Abdomen is flat. Bowel sounds are normal. There is no distension.     Palpations: Abdomen is soft.     Tenderness: There is no abdominal tenderness.  Musculoskeletal:        General:  Tenderness (back) present. Normal range of motion.     Cervical back: Normal range of motion and neck supple.  Skin:    General: Skin is warm.     Capillary Refill: Capillary refill takes less than 2 seconds.  Neurological:     General: No focal deficit present.     Mental Status: He is alert and oriented to person, place, and time. Mental status is at baseline.  Psychiatric:        Mood and Affect: Mood normal.        Thought Content: Thought content normal.    BP 118/72   Pulse 68   Temp 97.9 F (36.6 C)   Resp 20   Ht 5\' 11"  (1.803 m)   Wt 279 lb (126.6 kg)   SpO2 96%   BMI 38.91 kg/m  Wt Readings from Last 3 Encounters:  03/28/21 279 lb (126.6 kg)  03/06/21 277 lb (125.6 kg)  01/04/21 278 lb 12.8 oz (126.5 kg)     Health Maintenance Due  Topic Date Due   HIV Screening  Never done   Hepatitis C Screening  Never done   TETANUS/TDAP  Never done   Zoster Vaccines- Shingrix (1 of 2) Never done   COLONOSCOPY (Pts 45-40yrs Insurance coverage will need to be confirmed)  08/19/2019   PNA vac Low Risk Adult (1 of 2 - PCV13) Never done   COVID-19 Vaccine (4 - Booster for Pfizer series) 02/03/2021    There are no preventive care reminders to display for this patient.  Lab Results  Component Value Date   TSH 1.760 07/27/2020   Lab Results  Component Value Date   WBC 5.2 11/28/2020   HGB 16.8 11/28/2020   HCT 50.3 11/28/2020   MCV 95 11/28/2020   PLT 126 (L) 11/28/2020   Lab Results  Component Value Date   NA 136 03/06/2021   K 4.8 03/06/2021   CO2 22 03/06/2021   GLUCOSE 207 (H) 03/06/2021   BUN  39 (H) 03/06/2021   CREATININE 1.44 03/06/2021   BILITOT 0.7 11/28/2020   ALKPHOS 119 11/28/2020   AST 17 11/28/2020   ALT 24 11/28/2020   PROT 6.3 11/28/2020   ALBUMIN 4.6 11/28/2020   CALCIUM 10.1 03/06/2021   ANIONGAP 7 08/31/2015   GFR 51.01 (L) 03/06/2021   Lab Results  Component Value Date   CHOL 188 11/28/2020   Lab Results  Component Value Date    HDL 24 (L) 11/28/2020   Lab Results  Component Value Date   LDLCALC Comment (A) 11/28/2020   Lab Results  Component Value Date   TRIG 966 (HH) 11/28/2020   Lab Results  Component Value Date   CHOLHDL 7.8 (H) 11/28/2020   Lab Results  Component Value Date   HGBA1C 9.2 (A) 03/06/2021      Assessment & Plan:   Problem List Items Addressed This Visit       Cardiovascular and Mediastinum   Essential hypertension - Primary   Relevant Orders   CBC with Differential/Platelet   Comprehensive metabolic panel An individual hypertension care plan was established and reinforced today.  The patient's status was assessed using clinical findings on exam and labs or diagnostic tests. The patient's success at meeting treatment goals on disease specific evidence-based guidelines and found to be well controlled. SELF MANAGEMENT: The patient and I together assessed ways to personally work towards obtaining the recommended goals. RECOMMENDATIONS: avoid decongestants found in common cold remedies, decrease consumption of alcohol, perform routine monitoring of BP with home BP cuff, exercise, reduction of dietary salt, take medicines as prescribed, try not to miss doses and quit smoking.  Regular exercise and maintaining a healthy weight is needed.  Stress reduction may help. A CLINICAL SUMMARY including written plan identify barriers to care unique to individual due to social or financial issues.  We attempt to mutually creat solutions for individual and family understanding.      Endocrine   Hypogonadism in male AN INDIVIDUAL CARE PLAN for hypogonadism was established and reinforced today.  The patient's status was assessed using clinical findings on exam, labs, and other diagnostic testing. Patient's success at meeting treatment goals based on disease specific evidence-bassed guidelines and found to be in good control. RECOMMENDATIONS include maintaining present medicines and treatment.    Type 2  diabetes mellitus with stage 3a chronic kidney disease, without long-term current use of insulin (HCC)   Relevant Orders   Hemoglobin A1c An individual care plan for diabetes was established and reinforced today.  The patient's status was assessed using clinical findings on exam, labs and diagnostic testing. Patient success at meeting goals based on disease specific evidence-based guidelines and found to be good controlled. Medications were assessed and patient's understanding of the medical issues , including barriers were assessed. Recommend adherence to a diabetic diet, a graduated exercise program, HgbA1c level is checked quarterly, and urine microalbumin performed yearly .  Annual mono-filament sensation testing performed. Lower blood pressure and control hyperlipidemia is important. Get annual eye exams and annual flu shots and smoking cessation discussed.  Self management goals were discussed.      Genitourinary   BPH without obstruction/lower urinary tract symptoms AN INDIVIDUAL CARE PLAN for BPH was established and reinforced today.  The patient's status was assessed using clinical findings on exam, labs, and other diagnostic testing. Patient's success at meeting treatment goals based on disease specific evidence-bassed guidelines and found to be in good control. RECOMMENDATIONS include maintaining present medicines and treatment.  Other   Hyperlipidemia   Relevant Orders   Lipid panel AN INDIVIDUAL CARE PLAN for hyperlipidemia/ cholesterol was established and reinforced today.  The patient's status was assessed using clinical findings on exam, lab and other diagnostic tests. The patient's disease status was assessed based on evidence-based guidelines and found to be fair controlled. MEDICATIONS were reviewed. SELF MANAGEMENT GOALS have been discussed and patient's success at attaining the goal of low cholesterol was assessed. RECOMMENDATION given include regular exercise 3 days a  week and low cholesterol/low fat diet. CLINICAL SUMMARY including written plan to identify barriers unique to the patient due to social or economic  reasons was discussed.     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  The patient's status was assessed using clinical findings on exam, labs, and other diagnostic testing. Patient's success at meeting treatment goals based on disease specific evidence-bassed guidelines and found to be in fair control. RECOMMENDATIONS include maintining present medicines and treatment. He is on chronic hydrocodone with no abuse.  Negative REMS.     BMI 38.0-38.9,adult An individualize plan was formulated for obesity using patient history and physical exam to encourage weight loss.  An evidence based program was formulated.  Patient is to cut portion size with meals and to plan physical exercise 3 days a week at least 20 minutes.  Weight watchers and other programs are helpful.  Planned amount of weight loss 10 lbs. With hypertension and diabetes, patient meets the criteria for morbid obesity    Morbid obesity (HCC) An individualize plan was formulated for obesity using patient history and physical exam to encourage weight loss.  An evidence based program was formulated.  Patient is to cut portion size with meals and to plan physical exercise 3 days a week at least 20 minutes.  Weight watchers and other programs are helpful.  Planned amount of weight loss 10 lbs.        Follow-up: Return in about 4 months (around 07/28/2021) for fasting.    Brent Bulla, MD

## 2021-03-29 LAB — CBC WITH DIFFERENTIAL/PLATELET
Basophils Absolute: 0 10*3/uL (ref 0.0–0.2)
Basos: 0 %
EOS (ABSOLUTE): 0.1 10*3/uL (ref 0.0–0.4)
Eos: 3 %
Hematocrit: 44.7 % (ref 37.5–51.0)
Hemoglobin: 15.2 g/dL (ref 13.0–17.7)
Immature Grans (Abs): 0 10*3/uL (ref 0.0–0.1)
Immature Granulocytes: 1 %
Lymphocytes Absolute: 1.1 10*3/uL (ref 0.7–3.1)
Lymphs: 24 %
MCH: 32.1 pg (ref 26.6–33.0)
MCHC: 34 g/dL (ref 31.5–35.7)
MCV: 94 fL (ref 79–97)
Monocytes Absolute: 0.4 10*3/uL (ref 0.1–0.9)
Monocytes: 9 %
Neutrophils Absolute: 2.8 10*3/uL (ref 1.4–7.0)
Neutrophils: 63 %
Platelets: 128 10*3/uL — ABNORMAL LOW (ref 150–450)
RBC: 4.74 x10E6/uL (ref 4.14–5.80)
RDW: 14.8 % (ref 11.6–15.4)
WBC: 4.5 10*3/uL (ref 3.4–10.8)

## 2021-03-29 LAB — COMPREHENSIVE METABOLIC PANEL
ALT: 14 IU/L (ref 0–44)
AST: 15 IU/L (ref 0–40)
Albumin/Globulin Ratio: 2.9 — ABNORMAL HIGH (ref 1.2–2.2)
Albumin: 4.6 g/dL (ref 3.8–4.8)
Alkaline Phosphatase: 90 IU/L (ref 44–121)
BUN/Creatinine Ratio: 25 — ABNORMAL HIGH (ref 10–24)
BUN: 37 mg/dL — ABNORMAL HIGH (ref 8–27)
Bilirubin Total: 0.5 mg/dL (ref 0.0–1.2)
CO2: 17 mmol/L — ABNORMAL LOW (ref 20–29)
Calcium: 9.5 mg/dL (ref 8.6–10.2)
Chloride: 105 mmol/L (ref 96–106)
Creatinine, Ser: 1.5 mg/dL — ABNORMAL HIGH (ref 0.76–1.27)
Globulin, Total: 1.6 g/dL (ref 1.5–4.5)
Glucose: 116 mg/dL — ABNORMAL HIGH (ref 65–99)
Potassium: 5.2 mmol/L (ref 3.5–5.2)
Sodium: 138 mmol/L (ref 134–144)
Total Protein: 6.2 g/dL (ref 6.0–8.5)
eGFR: 51 mL/min/{1.73_m2} — ABNORMAL LOW (ref >59)

## 2021-03-29 LAB — LIPID PANEL
Chol/HDL Ratio: 4.3 ratio (ref 0.0–5.0)
Cholesterol, Total: 139 mg/dL (ref 100–199)
HDL: 32 mg/dL — ABNORMAL LOW (ref >39)
LDL Chol Calc (NIH): 54 mg/dL (ref 0–99)
Triglycerides: 343 mg/dL — ABNORMAL HIGH (ref 0–149)
VLDL Cholesterol Cal: 53 mg/dL — ABNORMAL HIGH (ref 5–40)

## 2021-03-29 LAB — HEMOGLOBIN A1C
Est. average glucose Bld gHb Est-mCnc: 194 mg/dL
Hgb A1c MFr Bld: 8.4 % — ABNORMAL HIGH (ref 4.8–5.6)

## 2021-03-29 LAB — CARDIOVASCULAR RISK ASSESSMENT

## 2021-04-03 ENCOUNTER — Other Ambulatory Visit: Payer: Self-pay | Admitting: Legal Medicine

## 2021-04-10 ENCOUNTER — Other Ambulatory Visit: Payer: Self-pay

## 2021-04-10 ENCOUNTER — Ambulatory Visit (INDEPENDENT_AMBULATORY_CARE_PROVIDER_SITE_OTHER): Payer: 59

## 2021-04-10 DIAGNOSIS — E291 Testicular hypofunction: Secondary | ICD-10-CM | POA: Diagnosis not present

## 2021-04-10 NOTE — Progress Notes (Signed)
   Testosterone injection given per order, patient tolerated well.   [name], LPN 5:68 AM

## 2021-04-29 ENCOUNTER — Other Ambulatory Visit: Payer: Self-pay | Admitting: Endocrinology

## 2021-05-03 ENCOUNTER — Other Ambulatory Visit: Payer: Self-pay

## 2021-05-03 ENCOUNTER — Encounter: Payer: Self-pay | Admitting: Legal Medicine

## 2021-05-03 ENCOUNTER — Ambulatory Visit (INDEPENDENT_AMBULATORY_CARE_PROVIDER_SITE_OTHER): Payer: Self-pay | Admitting: Legal Medicine

## 2021-05-03 VITALS — BP 108/66 | HR 66 | Temp 97.3°F | Resp 16 | Ht 71.0 in | Wt 274.0 lb

## 2021-05-03 DIAGNOSIS — Z0289 Encounter for other administrative examinations: Secondary | ICD-10-CM

## 2021-05-03 LAB — POCT URINALYSIS DIP (CLINITEK)
Bilirubin, UA: NEGATIVE
Blood, UA: NEGATIVE
Glucose, UA: >=1000 mg/dL — AB
Ketones, POC UA: NEGATIVE mg/dL
Leukocytes, UA: NEGATIVE
Nitrite, UA: NEGATIVE
POC PROTEIN,UA: NEGATIVE
Spec Grav, UA: 1.02 (ref 1.010–1.025)
Urobilinogen, UA: 0.2 E.U./dL
pH, UA: 5.5 (ref 5.0–8.0)

## 2021-05-03 NOTE — Progress Notes (Signed)
ye

## 2021-05-03 NOTE — Progress Notes (Signed)
Established Patient Office Visit  Subjective:  Patient ID: Brent Duarte, male    DOB: Nov 19, 1955  Age: 65 y.o. MRN: [id number]  CC:  Chief Complaint  Patient presents with   Annual Exam    DOT physical    HPI JERAL ZICK presents for DOT exam.    Past Medical History:  Diagnosis Date   Arthritis    BPH without obstruction/lower urinary tract symptoms 02/01/2020   History of chicken pox    History of kidney stones    History of measles    History of mumps    Hyperlipidemia    Idiopathic gout of right knee 02/01/2020   Imbalance    secondary to back issues   Multiple gastric ulcers    Numbness and tingling of both legs    Pneumonia    Psoriasis vulgaris 02/01/2020   Recurrent falls     Past Surgical History:  Procedure Laterality Date   bicep tear     right lower arm related to overgrowth of bone as stated per pt    KNEE ARTHROSCOPY     bilat   lithrotripsy     for kidney stones   LUMBAR LAMINECTOMY/DECOMPRESSION MICRODISCECTOMY N/A 09/07/2015   Procedure: MICRO LUMBAR DECOMPRESSION L4 - L5, REMOVAL OF SYNOVIAL CYST L4-L5;  Surgeon: Susa Day, MD;  Location: [facility];  Service: Orthopedics;  Laterality: N/A;   URETERAL STENT PLACEMENT     secondary to kidney stones/pt states had laser surgery prior     Family History  Problem Relation Age of Onset   Lung cancer Mother    Lung cancer Father    Diabetes Neg Hx     Social History   Socioeconomic History   Marital status: Married    Spouse name: Not on file   Number of children: Not on file   Years of education: Not on file   Highest education level: Not on file  Occupational History    Employer: [employer]   Tobacco Use   Smoking status: Never   Smokeless tobacco: Never  Substance and Sexual Activity   Alcohol use: No   Drug use: No   Sexual activity: Yes    Partners: Female  Other Topics Concern   Not on file  Social History Narrative   Not on file   Social Determinants of Health    Financial Resource Strain: Not on file  Food Insecurity: Not on file  Transportation Needs: Not on file  Physical Activity: Not on file  Stress: Not on file  Social Connections: Not on file  Intimate Partner Violence: Not on file    Outpatient Medications Prior to Visit  Medication Sig Dispense Refill   acetaminophen (TYLENOL) 500 MG tablet Tylenol Extra Strength     allopurinol (ZYLOPRIM) 300 MG tablet TAKE 1 TABLET BY MOUTH EVERYDAY AT BEDTIME 90 tablet 2   atorvastatin (LIPITOR) 40 MG tablet TAKE 1 TABLET BY MOUTH EVERY DAY 90 tablet 2   B-D 3CC LUER-LOK SYR 25GX1" 25G X 1" 3 ML MISC USE 1 ML BY DOES NOT APPLY ROUTE EVERY 14 (FOURTEEN) DAYS. ( 1 AND 1/2 INCH ON BACKORDER)     colesevelam (WELCHOL) 625 MG tablet TAKE 3 TABLETS (1,875 MG TOTAL) BY MOUTH DAILY. 540 tablet 1   dupilumab (DUPIXENT) 300 MG/2ML prefilled syringe Inject 300 mg into the skin every 14 (fourteen) days.     gabapentin (NEURONTIN) 300 MG capsule Take 300 mg by mouth as needed.  glucose blood (ONETOUCH VERIO) test strip 1 each by Other route daily. Use as instructed 100 each 3   JARDIANCE 25 MG TABS tablet TAKE 1 TABLET BY MOUTH EVERY DAY BEFORE BREAKFAST 30 tablet 11   lisinopril (ZESTRIL) 20 MG tablet TAKE 1 TABLET BY MOUTH EVERY DAY AS DIRECTED 90 tablet 2   metFORMIN (GLUCOPHAGE-XR) 500 MG 24 hr tablet TAKE 2 TABLETS BY MOUTH EVERY DAY WITH BREAKFAST 180 tablet 1   omega-3 acid ethyl esters (LOVAZA) 1 g capsule TAKE 2 CAPSULES BY MOUTH TWICE A DAY 360 capsule 2   oxyCODONE-acetaminophen (PERCOCET) 10-325 MG tablet Take 1 tablet by mouth every 4 (four) hours as needed for pain. 60 tablet 0   pioglitazone (ACTOS) 45 MG tablet Take 1 tablet (45 mg total) by mouth daily. 90 tablet 3   PRESCRIPTION MEDICATION Apply 1 application topically 2 (two) times daily. Calcitriol ointment 46mcg/g. [facility] [place].      repaglinide (PRANDIN) 2 MG tablet Take 2 tablets (4 mg total) by mouth 3 (three) times  daily before meals. 540 tablet 3   Syringe/Needle, Disp, (SYRINGE 3CC/25GX1-1/2") 25G X 1-1/2" 3 ML MISC 1 mL by Does not apply route every 14 (fourteen) days. 100 each 0   testosterone cypionate (DEPOTESTOSTERONE CYPIONATE) 200 MG/ML injection INJECT INTRAMUSCULARLY ONCE A MONTH 3 mL 3   HYDROcodone-acetaminophen (NORCO) 10-325 MG tablet hydrocodone 10 mg-acetaminophen 325 mg tablet  Take 1 tablet 3 times a day by oral route as needed.     Facility-Administered Medications Prior to Visit  Medication Dose Route Frequency Provider Last Rate Last Admin   testosterone cypionate (DEPOTESTOSTERONE CYPIONATE) injection 200 mg  200 mg Intramuscular Q14 Days Abigail Miyamoto, MD   200 mg at 04/10/21 7601    Allergies  Allergen Reactions   Bromocriptine Other (See Comments)    GI issues   Ciprofloxacin Other (See Comments)    "Tendon issues"   Trulicity [Dulaglutide] Other (See Comments)    GI issues    ROS Review of Systems  Constitutional:  Negative for chills, fatigue and fever.  HENT:  Negative for congestion, ear pain and sore throat.   Respiratory:  Negative for cough and shortness of breath.   Cardiovascular:  Negative for chest pain.  Gastrointestinal:  Negative for abdominal pain, constipation, diarrhea, nausea and vomiting.  Endocrine: Negative for polydipsia, polyphagia and polyuria.  Genitourinary:  Negative for dysuria and frequency.  Musculoskeletal:  Negative for arthralgias and myalgias.  Neurological:  Negative for dizziness and headaches.  Psychiatric/Behavioral:  Negative for dysphoric mood.        No dysphoria     Objective:    Physical Exam Constitutional:      Appearance: Normal appearance. He is obese.  HENT:     Head: Normocephalic.     Right Ear: Tympanic membrane, ear canal and external ear normal.     Left Ear: Tympanic membrane, ear canal and external ear normal.     Mouth/Throat:     Mouth: Mucous membranes are moist.     Pharynx:  Oropharynx is clear.  Eyes:     Extraocular Movements: Extraocular movements intact.     Conjunctiva/sclera: Conjunctivae normal.     Pupils: Pupils are equal, round, and reactive to light.  Cardiovascular:     Rate and Rhythm: Normal rate and regular rhythm.     Pulses: Normal pulses.     Heart sounds: Normal heart sounds. No murmur heard.   No gallop.  Pulmonary:     Effort: Pulmonary effort is normal. No respiratory distress.     Breath sounds: Normal breath sounds. No wheezing.  Abdominal:     General: Abdomen is flat. Bowel sounds are normal. There is no distension.     Palpations: Abdomen is soft.     Tenderness: There is no abdominal tenderness.  Musculoskeletal:        General: Normal range of motion.     Cervical back: Normal range of motion.  Skin:    General: Skin is warm.     Capillary Refill: Capillary refill takes less than 2 seconds.  Neurological:     General: No focal deficit present.     Mental Status: He is alert and oriented to person, place, and time. Mental status is at baseline.  Psychiatric:        Mood and Affect: Mood normal.    BP 108/66   Pulse 66   Temp (!) 97.3 F (36.3 C)   Resp 16   Ht $R'5\' 11"'rq$  (1.803 m)   Wt 274 lb (124.3 kg)   SpO2 96%   BMI 38.22 kg/m  Wt Readings from Last 3 Encounters:  05/03/21 274 lb (124.3 kg)  03/28/21 279 lb (126.6 kg)  03/06/21 277 lb (125.6 kg)     Health Maintenance Due  Topic Date Due   HIV Screening  Never done   Hepatitis C Screening  Never done   TETANUS/TDAP  Never done   Zoster Vaccines- Shingrix (1 of 2) Never done   COLONOSCOPY (Pts 45-70yrs Insurance coverage will need to be confirmed)  08/19/2019   PNA vac Low Risk Adult (1 of 2 - PCV13) Never done   COVID-19 Vaccine (4 - Booster for Pfizer series) 02/03/2021   OPHTHALMOLOGY EXAM  04/22/2021    There are no preventive care reminders to display for this patient.  Lab Results  Component Value Date   TSH 1.760 07/27/2020   Lab Results   Component Value Date   WBC 4.5 03/28/2021   HGB 15.2 03/28/2021   HCT 44.7 03/28/2021   MCV 94 03/28/2021   PLT 128 (L) 03/28/2021   Lab Results  Component Value Date   NA 138 03/28/2021   K 5.2 03/28/2021   CO2 17 (L) 03/28/2021   GLUCOSE 116 (H) 03/28/2021   BUN 37 (H) 03/28/2021   CREATININE 1.50 (H) 03/28/2021   BILITOT 0.5 03/28/2021   ALKPHOS 90 03/28/2021   AST 15 03/28/2021   ALT 14 03/28/2021   PROT 6.2 03/28/2021   ALBUMIN 4.6 03/28/2021   CALCIUM 9.5 03/28/2021   ANIONGAP 7 08/31/2015   EGFR 51 (L) 03/28/2021   GFR 51.01 (L) 03/06/2021   Lab Results  Component Value Date   CHOL 139 03/28/2021   Lab Results  Component Value Date   HDL 32 (L) 03/28/2021   Lab Results  Component Value Date   LDLCALC 54 03/28/2021   Lab Results  Component Value Date   TRIG 343 (H) 03/28/2021   Lab Results  Component Value Date   CHOLHDL 4.3 03/28/2021   Lab Results  Component Value Date   HGBA1C 8.4 (H) 03/28/2021      Assessment & Plan:   Diagnoses and all orders for this visit: Encounter for examination required by Department of Transportation (DOT) -     POCT URINALYSIS DIP (CLINITEK)  DOT exam performed     Follow-up: Return if symptoms worsen or fail to improve.    [employer] [employer]  Henrene Pastor, MD

## 2021-05-08 ENCOUNTER — Other Ambulatory Visit: Payer: Self-pay | Admitting: Endocrinology

## 2021-05-10 ENCOUNTER — Ambulatory Visit (INDEPENDENT_AMBULATORY_CARE_PROVIDER_SITE_OTHER): Payer: 59

## 2021-05-10 DIAGNOSIS — E291 Testicular hypofunction: Secondary | ICD-10-CM

## 2021-05-10 NOTE — Progress Notes (Signed)
Patient came in for testosterone shot and tolerated shot well.

## 2021-06-05 ENCOUNTER — Ambulatory Visit: Payer: 59 | Admitting: Endocrinology

## 2021-06-05 ENCOUNTER — Other Ambulatory Visit: Payer: Self-pay

## 2021-06-05 VITALS — BP 138/88 | HR 78 | Ht 71.0 in | Wt 289.4 lb

## 2021-06-05 DIAGNOSIS — N1831 Chronic kidney disease, stage 3a: Secondary | ICD-10-CM | POA: Diagnosis not present

## 2021-06-05 DIAGNOSIS — E1122 Type 2 diabetes mellitus with diabetic chronic kidney disease: Secondary | ICD-10-CM

## 2021-06-05 LAB — POCT GLYCOSYLATED HEMOGLOBIN (HGB A1C): Hemoglobin A1C: 6.2 % — AB (ref 4.0–5.6)

## 2021-06-05 MED ORDER — PIOGLITAZONE HCL 15 MG PO TABS: 15.0000 mg | ORAL_TABLET | Freq: Every day | ORAL | 3 refills | Status: DC

## 2021-06-05 NOTE — Patient Instructions (Addendum)
I have sent a prescription to your pharmacy, to reduce the pioglitazone.   Please continue the same other medications.   check your blood sugar once a day.  vary the time of day when you check, between before the 3 meals, and at bedtime.  also check if you have symptoms of your blood sugar being too high or too low.  please keep a record of the readings and bring it to your next appointment here (or you can bring the meter itself).  You can write it on any piece of paper.  please call us sooner if your blood sugar goes below 70, or if you have a lot of readings over 200.  Please come back for a follow-up appointment in 3-4 months.

## 2021-06-05 NOTE — Progress Notes (Signed)
Subjective:    Patient ID: Brent Duarte, male    DOB: 1956-10-14, 65 y.o.   MRN: [id number]  HPI Pt returns for f/u of diabetes mellitus: DM type: 2 Dx'ed: 2017 Complications: PN and stage 3b CRI.   Therapy: 5 oral meds DKA: never Severe hypoglycemia: never Pancreatitis: once, in 2018 Pancreatic imaging: none available.  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  Other: he has never been on insulin, but he knows how; edema limits rx options; CRI limits rx options and dosages; he did not tolerate bromocriptine (n/v), trulicity (pancreatitis), or acarbose (diarrhea); he declines bariatric surgery.   Interval history: Meter is downloaded today, and the printout is scanned into the record. Cbg varies from 79-156.  There is no trend throughout the day.  He reports intermitt tremor and sweating.  Oral intake helps.    Past Medical History:  Diagnosis Date   Arthritis    BPH without obstruction/lower urinary tract symptoms 02/01/2020   History of chicken pox    History of kidney stones    History of measles    History of mumps    Hyperlipidemia    Idiopathic gout of right knee 02/01/2020   Imbalance    secondary to back issues   Multiple gastric ulcers    Numbness and tingling of both legs    Pneumonia    Psoriasis vulgaris 02/01/2020   Recurrent falls     Past Surgical History:  Procedure Laterality Date   bicep tear     right lower arm related to overgrowth of bone as stated per pt    KNEE ARTHROSCOPY     bilat   lithrotripsy     for kidney stones   LUMBAR LAMINECTOMY/DECOMPRESSION MICRODISCECTOMY N/A 09/07/2015   Procedure: MICRO LUMBAR DECOMPRESSION L4 - L5, REMOVAL OF SYNOVIAL CYST L4-L5;  Surgeon: Jene Every, MD;  Location: [facility];  Service: Orthopedics;  Laterality: N/A;   URETERAL STENT PLACEMENT     secondary to kidney stones/pt states had laser surgery  prior     Social History   Socioeconomic History   Marital status: Married    Spouse name: Not on file   Number of children: Not on file   Years of education: Not on file   Highest education level: Not on file  Occupational History    Employer: [employer]   Tobacco Use   Smoking status: Never   Smokeless tobacco: Never  Substance and Sexual Activity   Alcohol use: No   Drug use: No   Sexual activity: Yes    Partners: Female  Other Topics Concern   Not on file  Social History Narrative   Not on file   Social Determinants of Health   Financial Resource Strain: Not on file  Food Insecurity: Not on file  Transportation Needs: Not on file  Physical Activity: Not on file  Stress: Not on file  Social Connections: Not on file  Intimate Partner Violence: Not on file    Current Outpatient Medications on File Prior to Visit  Medication Sig Dispense Refill   acetaminophen (TYLENOL) 500 MG tablet Tylenol Extra Strength     allopurinol (ZYLOPRIM) 300 MG tablet TAKE 1 TABLET BY MOUTH EVERYDAY AT BEDTIME 90 tablet 2   atorvastatin (LIPITOR) 40 MG tablet TAKE 1 TABLET BY MOUTH EVERY DAY 90 tablet 2   B-D 3CC  LUER-LOK SYR 25GX1" 25G X 1" 3 ML MISC USE 1 ML BY DOES NOT APPLY ROUTE EVERY 14 (FOURTEEN) DAYS. ( 1 AND 1/2 INCH ON BACKORDER)     colesevelam (WELCHOL) 625 MG tablet TAKE 3 TABLETS (1,875 MG TOTAL) BY MOUTH DAILY. 270 tablet 3   dupilumab (DUPIXENT) 300 MG/2ML prefilled syringe Inject 300 mg into the skin every 14 (fourteen) days.     gabapentin (NEURONTIN) 300 MG capsule Take 300 mg by mouth as needed.      glucose blood (ONETOUCH VERIO) test strip 1 each by Other route daily. Use as instructed 100 each 3   JARDIANCE 25 MG TABS tablet TAKE 1 TABLET BY MOUTH EVERY DAY BEFORE BREAKFAST 30 tablet 11   lisinopril (ZESTRIL) 20 MG tablet TAKE 1 TABLET BY MOUTH EVERY DAY AS DIRECTED 90 tablet 2   metFORMIN (GLUCOPHAGE-XR) 500 MG 24 hr tablet TAKE 2 TABLETS BY MOUTH EVERY DAY  WITH BREAKFAST 180 tablet 1   omega-3 acid ethyl esters (LOVAZA) 1 g capsule TAKE 2 CAPSULES BY MOUTH TWICE A DAY 360 capsule 2   oxyCODONE-acetaminophen (PERCOCET) 10-325 MG tablet Take 1 tablet by mouth every 4 (four) hours as needed for pain. 60 tablet 0   PRESCRIPTION MEDICATION Apply 1 application topically 2 (two) times daily. Calcitriol ointment 17mcg/g. [facility] [place].      repaglinide (PRANDIN) 2 MG tablet Take 2 tablets (4 mg total) by mouth 3 (three) times daily before meals. 540 tablet 3   Syringe/Needle, Disp, (SYRINGE 3CC/25GX1-1/2") 25G X 1-1/2" 3 ML MISC 1 mL by Does not apply route every 14 (fourteen) days. 100 each 0   testosterone cypionate (DEPOTESTOSTERONE CYPIONATE) 200 MG/ML injection INJECT INTRAMUSCULARLY ONCE A MONTH 3 mL 3   Current Facility-Administered Medications on File Prior to Visit  Medication Dose Route Frequency Provider Last Rate Last Admin   testosterone cypionate (DEPOTESTOSTERONE CYPIONATE) injection 200 mg  200 mg Intramuscular Q14 Days Abigail Miyamoto, MD   200 mg at 05/10/21 7062      Family History  Problem Relation Age of Onset   Lung cancer Mother    Lung cancer Father    Diabetes Neg Hx     BP 138/88 (BP Location: Right Arm, Patient Position: Sitting, Cuff Size: Normal)   Pulse 78   Ht 5\' 11"  (1.803 m)   Wt 289 lb 6.4 oz (131.3 kg)   SpO2 96%   BMI 40.36 kg/m    Review of Systems     Objective:   Physical Exam Pulses: dorsalis pedis intact bilat.   MSK: no deformity of the feet CV: 2+ bilat leg edema Skin:  no ulcer on the feet.  normal color and temp on the feet.   Neuro: sensation is intact to touch on the feet.   Ext: there is bilateral onychomycosis of the toenails.    Lab Results  Component Value Date   HGBA1C 6.2 (A) 06/05/2021        Assessment & Plan:  Type 2 DM: Hypoglycemia, due to repaglinide: this limits aggressiveness of glycemic control Edema: unchanged, but may worsen with  continuation of pioglitazone  Patient Instructions  I have sent a prescription to your pharmacy, to reduce the pioglitazone.   Please continue the same other medications.   check your blood sugar once a day.  vary the time of day when you check, between before the 3 meals, and at bedtime.  also check if you have symptoms of your blood  sugar being too high or too low.  please keep a record of the readings and bring it to your next appointment here (or you can bring the meter itself).  You can write it on any piece of paper.  please call us sooner if your blood sugar goes below 70, or if you have a lot of readings over 200.  Please come back for a follow-up appointment in 3-4 months.

## 2021-06-13 ENCOUNTER — Ambulatory Visit (INDEPENDENT_AMBULATORY_CARE_PROVIDER_SITE_OTHER): Payer: 59

## 2021-06-13 DIAGNOSIS — E291 Testicular hypofunction: Secondary | ICD-10-CM

## 2021-06-13 MED ORDER — TESTOSTERONE CYPIONATE 200 MG/ML IM SOLN
200.0000 mg | INTRAMUSCULAR | Status: DC
  Administered 2021-06-13 – 2022-04-12 (×10): 200 mg via INTRAMUSCULAR

## 2021-07-03 ENCOUNTER — Other Ambulatory Visit: Payer: Self-pay | Admitting: Legal Medicine

## 2021-07-03 DIAGNOSIS — E782 Mixed hyperlipidemia: Secondary | ICD-10-CM

## 2021-07-03 NOTE — Telephone Encounter (Signed)
Refill sent to pharmacy.   

## 2021-07-16 ENCOUNTER — Other Ambulatory Visit: Payer: Self-pay | Admitting: Legal Medicine

## 2021-07-17 ENCOUNTER — Ambulatory Visit (INDEPENDENT_AMBULATORY_CARE_PROVIDER_SITE_OTHER): Payer: 59

## 2021-07-17 ENCOUNTER — Other Ambulatory Visit (INDEPENDENT_AMBULATORY_CARE_PROVIDER_SITE_OTHER): Payer: 59

## 2021-07-17 ENCOUNTER — Encounter: Payer: Self-pay | Admitting: Legal Medicine

## 2021-07-17 DIAGNOSIS — Z23 Encounter for immunization: Secondary | ICD-10-CM

## 2021-07-17 DIAGNOSIS — E291 Testicular hypofunction: Secondary | ICD-10-CM

## 2021-07-17 NOTE — Telephone Encounter (Signed)
Refill sent to pharmacy.   

## 2021-07-28 ENCOUNTER — Ambulatory Visit: Payer: 59 | Admitting: Legal Medicine

## 2021-08-14 ENCOUNTER — Other Ambulatory Visit: Payer: Self-pay

## 2021-08-14 ENCOUNTER — Ambulatory Visit (INDEPENDENT_AMBULATORY_CARE_PROVIDER_SITE_OTHER): Payer: 59

## 2021-08-14 DIAGNOSIS — E291 Testicular hypofunction: Secondary | ICD-10-CM | POA: Diagnosis not present

## 2021-08-14 NOTE — Progress Notes (Signed)
   Testosterone injection given per order, patient tolerated well.   [name], LPN 2:57 AM

## 2021-08-16 ENCOUNTER — Ambulatory Visit: Payer: 59 | Admitting: Legal Medicine

## 2021-08-16 ENCOUNTER — Encounter: Payer: Self-pay | Admitting: Legal Medicine

## 2021-08-16 ENCOUNTER — Other Ambulatory Visit: Payer: Self-pay

## 2021-08-16 VITALS — BP 120/76 | HR 73 | Temp 96.4°F | Ht 71.0 in | Wt 291.2 lb

## 2021-08-16 DIAGNOSIS — E782 Mixed hyperlipidemia: Secondary | ICD-10-CM

## 2021-08-16 DIAGNOSIS — E1122 Type 2 diabetes mellitus with diabetic chronic kidney disease: Secondary | ICD-10-CM

## 2021-08-16 DIAGNOSIS — Z23 Encounter for immunization: Secondary | ICD-10-CM | POA: Diagnosis not present

## 2021-08-16 DIAGNOSIS — N1831 Chronic kidney disease, stage 3a: Secondary | ICD-10-CM

## 2021-08-16 DIAGNOSIS — Z6838 Body mass index (BMI) 38.0-38.9, adult: Secondary | ICD-10-CM

## 2021-08-16 DIAGNOSIS — E291 Testicular hypofunction: Secondary | ICD-10-CM

## 2021-08-16 DIAGNOSIS — I10 Essential (primary) hypertension: Secondary | ICD-10-CM

## 2021-08-16 DIAGNOSIS — H65112 Acute and subacute allergic otitis media (mucoid) (sanguinous) (serous), left ear: Secondary | ICD-10-CM

## 2021-08-16 DIAGNOSIS — Z79891 Long term (current) use of opiate analgesic: Secondary | ICD-10-CM

## 2021-08-16 DIAGNOSIS — N400 Enlarged prostate without lower urinary tract symptoms: Secondary | ICD-10-CM

## 2021-08-16 MED ORDER — CHLORPHENIRAMINE-PHENYLEPHRINE 4-10 MG PO TABS: 1.0000 | ORAL_TABLET | ORAL | 1 refills | Status: DC | PRN

## 2021-08-16 MED ORDER — AMOXICILLIN-POT CLAVULANATE 875-125 MG PO TABS: 1.0000 | ORAL_TABLET | Freq: Two times a day (BID) | ORAL | 0 refills | Status: DC

## 2021-08-16 NOTE — Progress Notes (Signed)
 Established Patient Office Visit  Subjective:  Patient ID: Brent Duarte, male    DOB: 07/23/1956  Age: 65 y.o. MRN: [id number]  CC:  Chief Complaint  Patient presents with   Otalgia   Hypertension   Diabetes   Hyperlipidemia     HPI Rambo P Garton presents for chronic visit  Left ear pain and popping, pain last night no fever or chills.  No uri.  Patient present with type 2 diabetes.  Specifically, this is type 2, nonindulin requiring diabetes, complicated by renal.  Compliance with treatment has been good; patient take medicines as directed, maintains diet and exercise regimen, follows up as directed, and is keeping glucose diary.  Date of  diagnosis 2005.  Depression screen has been performed.Tobacco screen nonsmoker. Current medicines for diabetes jardiance, metformin,prandin.  Patient is on lisinorpeilfor renal protection and atorvastatin for cholesterol control.  Patient performs foot exams daily and last ophthalmologic exam was needs eye exam  Patient presents for follow up of hypertension.  Patient tolerating ;isinopril well with side effects.  Patient was diagnosed with hypertension 2010 so has been treated for hypertension for 12 years.Patient is working on maintaining diet and exercise regimen and follows up as directed. Complication include non.   .lph.  Past Medical History:  Diagnosis Date   Arthritis    BPH without obstruction/lower urinary tract symptoms 02/01/2020   History of chicken pox    History of kidney stones    History of measles    History of mumps    Hyperlipidemia    Idiopathic gout of right knee 02/01/2020   Imbalance    secondary to back issues   Multiple gastric ulcers    Numbness and tingling of both legs    Pneumonia    Psoriasis vulgaris 02/01/2020   Recurrent falls     Past Surgical History:  Procedure Laterality Date   bicep tear     right lower arm related to overgrowth of bone as stated per pt    KNEE ARTHROSCOPY     bilat    lithrotripsy     for kidney stones   LUMBAR LAMINECTOMY/DECOMPRESSION MICRODISCECTOMY N/A 09/07/2015   Procedure: MICRO LUMBAR DECOMPRESSION L4 - L5, REMOVAL OF SYNOVIAL CYST L4-L5;  Surgeon: Jeffrey Beane, MD;  Location: [facility];  Service: Orthopedics;  Laterality: N/A;   URETERAL STENT PLACEMENT     secondary to kidney stones/pt states had laser surgery prior     Family History  Problem Relation Age of Onset   Lung cancer Mother    Lung cancer Father    Diabetes Neg Hx     Social History   Socioeconomic History   Marital status: Married    Spouse name: Not on file   Number of children: Not on file   Years of education: Not on file   Highest education level: Not on file  Occupational History    Employer: [employer]   Tobacco Use   Smoking status: Never   Smokeless tobacco: Never  Substance and Sexual Activity   Alcohol use: No   Drug use: No   Sexual activity: Yes    Partners: Female  Other Topics Concern   Not on file  Social History Narrative   Not on file   Social Determinants of Health   Financial Resource Strain: Not on file  Food Insecurity: Not on file  Transportation Needs: Not on file  Physical Activity: Not on file  Stress: Not on file  Social   Connections: Not on file  Intimate Partner Violence: Not on file    Outpatient Medications Prior to Visit  Medication Sig Dispense Refill   acetaminophen (TYLENOL) 500 MG tablet Tylenol Extra Strength     allopurinol (ZYLOPRIM) 300 MG tablet TAKE 1 TABLET BY MOUTH EVERYDAY AT BEDTIME 90 tablet 2   atorvastatin (LIPITOR) 40 MG tablet TAKE 1 TABLET BY MOUTH EVERY DAY 90 tablet 2   B-D 3CC LUER-LOK SYR 25GX1" 25G X 1" 3 ML MISC USE 1 ML BY DOES NOT APPLY ROUTE EVERY 14 (FOURTEEN) DAYS. ( 1 AND 1/2 INCH ON BACKORDER)     colesevelam (WELCHOL) 625 MG tablet TAKE 3 TABLETS (1,875 MG TOTAL) BY MOUTH DAILY. 270 tablet 3   dupilumab (DUPIXENT) 300 MG/2ML prefilled syringe Inject 300 mg into the skin every 14  (fourteen) days.     gabapentin (NEURONTIN) 300 MG capsule Take 300 mg by mouth as needed.      glucose blood (ONETOUCH VERIO) test strip 1 each by Other route daily. Use as instructed 100 each 3   JARDIANCE 25 MG TABS tablet TAKE 1 TABLET BY MOUTH EVERY DAY BEFORE BREAKFAST 30 tablet 11   lisinopril (ZESTRIL) 20 MG tablet TAKE 1 TABLET BY MOUTH EVERY DAY AS DIRECTED 90 tablet 2   metFORMIN (GLUCOPHAGE-XR) 500 MG 24 hr tablet TAKE 2 TABLETS BY MOUTH EVERY DAY WITH BREAKFAST 180 tablet 1   omega-3 acid ethyl esters (LOVAZA) 1 g capsule TAKE 2 CAPSULES BY MOUTH TWICE A DAY 360 capsule 2   oxyCODONE-acetaminophen (PERCOCET) 10-325 MG tablet Take 1 tablet by mouth every 4 (four) hours as needed for pain. 60 tablet 0   pioglitazone (ACTOS) 15 MG tablet Take 1 tablet (15 mg total) by mouth daily. 90 tablet 3   PRESCRIPTION MEDICATION Apply 1 application topically 2 (two) times daily. Calcitriol ointment 62mg/g. [facility] [place]      repaglinide (PRANDIN) 2 MG tablet Take 2 tablets (4 mg total) by mouth 3 (three) times daily before meals. 540 tablet 3   Syringe/Needle, Disp, (SYRINGE 3CC/25GX1-1/2") 25G X 1-1/2" 3 ML MISC 1 mL by Does not apply route every 14 (fourteen) days. 100 each 0   testosterone cypionate (DEPOTESTOSTERONE CYPIONATE) 200 MG/ML injection INJECT 1ML INTRAMUSCULARLY ONCE A MONTH 3 mL 3   Facility-Administered Medications Prior to Visit  Medication Dose Route Frequency Provider Last Rate Last Admin   testosterone cypionate (DEPOTESTOSTERONE CYPIONATE) injection 200 mg  200 mg Intramuscular Q14 Days PLillard Anes MD   200 mg at 05/10/21 07858  testosterone cypionate (DEPOTESTOSTERONE CYPIONATE) injection 200 mg  200 mg Intramuscular Q28 days PLillard Anes MD   200 mg at 08/14/21 08502   Allergies  Allergen Reactions   Bromocriptine Other (See Comments)    GI issues   Ciprofloxacin Other (See Comments)    "Tendon issues"   Trulicity [Dulaglutide]  Other (See Comments)    GI issues    ROS Review of Systems  Constitutional:  Negative for activity change and appetite change.  HENT:  Positive for ear pain. Negative for congestion.   Eyes:  Negative for visual disturbance.  Respiratory:  Negative for chest tightness and shortness of breath.   Cardiovascular:  Negative for chest pain, palpitations and leg swelling.  Gastrointestinal:  Negative for abdominal distention and abdominal pain.  Genitourinary:  Negative for difficulty urinating and dysuria.  Musculoskeletal: Negative.  Negative for arthralgias and back pain.  Neurological: Negative.   Psychiatric/Behavioral:  Negative for agitation and behavioral problems.      Objective:    Physical Exam  BP 120/76 (BP Location: Left Arm, Patient Position: Sitting, Cuff Size: Large)   Pulse 73   Temp (!) 96.4 F (35.8 C) (Temporal)   Ht 5' 11" (1.803 m)   Wt 291 lb 3.2 oz (132.1 kg)   SpO2 96%   BMI 40.61 kg/m  Wt Readings from Last 3 Encounters:  08/16/21 291 lb 3.2 oz (132.1 kg)  06/05/21 289 lb 6.4 oz (131.3 kg)  05/03/21 274 lb (124.3 kg)     Health Maintenance Due  Topic Date Due   Pneumonia Vaccine 65+ Years old (1 - PCV) Never done   HIV Screening  Never done   Hepatitis C Screening  Never done   Zoster Vaccines- Shingrix (1 of 2) Never done   COLONOSCOPY (Pts 45-49yrs Insurance coverage will need to be confirmed)  08/19/2019   OPHTHALMOLOGY EXAM  04/22/2021    There are no preventive care reminders to display for this patient.  Lab Results  Component Value Date   TSH 1.760 07/27/2020   Lab Results  Component Value Date   WBC 4.5 03/28/2021   HGB 15.2 03/28/2021   HCT 44.7 03/28/2021   MCV 94 03/28/2021   PLT 128 (L) 03/28/2021   Lab Results  Component Value Date   NA 138 03/28/2021   K 5.2 03/28/2021   CO2 17 (L) 03/28/2021   GLUCOSE 116 (H) 03/28/2021   BUN 37 (H) 03/28/2021   CREATININE 1.50 (H) 03/28/2021   BILITOT 0.5 03/28/2021   ALKPHOS  90 03/28/2021   AST 15 03/28/2021   ALT 14 03/28/2021   PROT 6.2 03/28/2021   ALBUMIN 4.6 03/28/2021   CALCIUM 9.5 03/28/2021   ANIONGAP 7 08/31/2015   EGFR 51 (L) 03/28/2021   GFR 51.01 (L) 03/06/2021   Lab Results  Component Value Date   CHOL 139 03/28/2021   Lab Results  Component Value Date   HDL 32 (L) 03/28/2021   Lab Results  Component Value Date   LDLCALC 54 03/28/2021   Lab Results  Component Value Date   TRIG 343 (H) 03/28/2021   Lab Results  Component Value Date   CHOLHDL 4.3 03/28/2021   Lab Results  Component Value Date   HGBA1C 6.2 (A) 06/05/2021      Assessment & Plan:   Problem List Items Addressed This Visit       Cardiovascular and Mediastinum   Essential hypertension   Relevant Orders   Comprehensive metabolic panel   CBC with Differential/Platelet An individual hypertension care plan was established and reinforced today.  The patient's status was assessed using clinical findings on exam and labs or diagnostic tests. The patient's success at meeting treatment goals on disease specific evidence-based guidelines and found to be well controlled. SELF MANAGEMENT: The patient and I together assessed ways to personally work towards obtaining the recommended goals. RECOMMENDATIONS: avoid decongestants found in common cold remedies, decrease consumption of alcohol, perform routine monitoring of BP with home BP cuff, exercise, reduction of dietary salt, take medicines as prescribed, try not to miss doses and quit smoking.  Regular exercise and maintaining a healthy weight is needed.  Stress reduction may help. A CLINICAL SUMMARY including written plan identify barriers to care unique to individual due to social or financial issues.  We attempt to mutually creat solutions for individual and family understanding.      Endocrine   Hypogonadism in male     Relevant Orders   Testosterone,Free and Total AN INDIVIDUAL CARE PLAN hypogonadism was established and  reinforced today.  The patient's status was assessed using clinical findings on exam, labs, and other diagnostic testing. Patient's success at meeting treatment goals based on disease specific evidence-bassed guidelines and found to be in good control. RECOMMENDATIONS include maintaiing present medicines and treatment.     Type 2 diabetes mellitus with stage 3a chronic kidney disease, without long-term current use of insulin (HCC)   Relevant Orders   Hemoglobin A1c An individual care plan for diabetes was established and reinforced today.  The patient's status was assessed using clinical findings on exam, labs and diagnostic testing. Patient success at meeting goals based on disease specific evidence-based guidelines and found to be good controlled. Medications were assessed and patient's understanding of the medical issues , including barriers were assessed. Recommend adherence to a diabetic diet, a graduated exercise program, HgbA1c level is checked quarterly, and urine microalbumin performed yearly .  Annual mono-filament sensation testing performed. Lower blood pressure and control hyperlipidemia is important. Get annual eye exams and annual flu shots and smoking cessation discussed.  Self management goals were discussed.      Genitourinary   BPH without obstruction/lower urinary tract symptoms   Relevant Orders   PSA AN INDIVIDUAL CARE PLAN for BPH was established and reinforced today.  The patient's status was assessed using clinical findings on exam, labs, and other diagnostic testing. Patient's success at meeting treatment goals based on disease specific evidence-bassed guidelines and found to be in good control. RECOMMENDATIONS include maintaining present medicines and treatment.      Other   Hyperlipidemia   Relevant Orders   Lipid panel AN INDIVIDUAL CARE PLAN for hyperlipidemia/ cholesterol was established and reinforced today.  The patient's status was assessed using clinical  findings on exam, lab and other diagnostic tests. The patient's disease status was assessed based on evidence-based guidelines and found to be well controlled. MEDICATIONS were reviewed. SELF MANAGEMENT GOALS have been discussed and patient's success at attaining the goal of low cholesterol was assessed. RECOMMENDATION given include regular exercise 3 days a week and low cholesterol/low fat diet. CLINICAL SUMMARY including written plan to identify barriers unique to the patient due to social or economic  reasons was discussed.     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  The patient's status was assessed using clinical findings on exam, labs, and other diagnostic testing. Patient's success at meeting treatment goals based on disease specific evidence-bassed guidelines and found to be in fair control. RECOMMENDATIONS include maintining present medicines and treatment. He is on chronicoxycoxone with no abuse.  Negative REMS.     BMI 38.0-38.9,adult An individualize plan was formulated for obesity using patient history and physical exam to encourage weight loss.  An evidence based program was formulated.  Patient is to cut portion size with meals and to plan physical exercise 3 days a week at least 20 minutes.  Weight watchers and other programs are helpful.  Planned amount of weight loss 10 lbs. . With hypertension and hyperlipidemia, patient makes criteria for morbid obesity    Morbid obesity (HCC) An individualize plan was formulated for obesity using patient history and physical exam to encourage weight loss.  An evidence based program was formulated.  Patient is to cut portion size with meals and to plan physical exercise 3 days a week at least 20 minutes.  Weight watchers and other   programs are helpful.  Planned amount of weight loss 10 lbs.    Other Visit Diagnoses     Non-recurrent acute allergic otitis media of left ear    -   Primary   Relevant Medications   Chlorpheniramine-Phenylephrine 4-10 MG tablet   amoxicillin-clavulanate (AUGMENTIN) 875-125 MG tablet Treat infected ear with z-pack   Need for vaccination       Relevant Orders   Tdap vaccine greater than or equal to 7yo IM (Completed)       Meds ordered this encounter  Medications   Chlorpheniramine-Phenylephrine 4-10 MG tablet    Sig: Take 1 tablet by mouth every 4 (four) hours as needed for congestion.    Dispense:  40 tablet    Refill:  1   amoxicillin-clavulanate (AUGMENTIN) 875-125 MG tablet    Sig: Take 1 tablet by mouth 2 (two) times daily.    Dispense:  20 tablet    Refill:  0     Follow-up: No follow-ups on file.    Lawrence Perry, MD 

## 2021-08-17 LAB — CBC WITH DIFFERENTIAL/PLATELET
Basophils Absolute: 0 10*3/uL (ref 0.0–0.2)
Basos: 0 %
EOS (ABSOLUTE): 0.1 10*3/uL (ref 0.0–0.4)
Eos: 3 %
Hematocrit: 46 % (ref 37.5–51.0)
Hemoglobin: 15.7 g/dL (ref 13.0–17.7)
Immature Grans (Abs): 0.1 10*3/uL (ref 0.0–0.1)
Immature Granulocytes: 1 %
Lymphocytes Absolute: 1.2 10*3/uL (ref 0.7–3.1)
Lymphs: 23 %
MCH: 32.2 pg (ref 26.6–33.0)
MCHC: 34.1 g/dL (ref 31.5–35.7)
MCV: 95 fL (ref 79–97)
Monocytes Absolute: 0.5 10*3/uL (ref 0.1–0.9)
Monocytes: 9 %
Neutrophils Absolute: 3.4 10*3/uL (ref 1.4–7.0)
Neutrophils: 64 %
Platelets: 123 10*3/uL — ABNORMAL LOW (ref 150–450)
RBC: 4.87 x10E6/uL (ref 4.14–5.80)
RDW: 14.1 % (ref 11.6–15.4)
WBC: 5.3 10*3/uL (ref 3.4–10.8)

## 2021-08-17 LAB — LIPID PANEL
Chol/HDL Ratio: 4.1 ratio (ref 0.0–5.0)
Cholesterol, Total: 151 mg/dL (ref 100–199)
HDL: 37 mg/dL — ABNORMAL LOW
LDL Chol Calc (NIH): 76 mg/dL (ref 0–99)
Triglycerides: 231 mg/dL — ABNORMAL HIGH (ref 0–149)
VLDL Cholesterol Cal: 38 mg/dL (ref 5–40)

## 2021-08-17 LAB — COMPREHENSIVE METABOLIC PANEL WITH GFR
ALT: 23 IU/L (ref 0–44)
AST: 18 IU/L (ref 0–40)
Albumin/Globulin Ratio: 2.9 — ABNORMAL HIGH (ref 1.2–2.2)
Albumin: 4.7 g/dL (ref 3.8–4.8)
Alkaline Phosphatase: 80 IU/L (ref 44–121)
BUN/Creatinine Ratio: 23 (ref 10–24)
BUN: 35 mg/dL — ABNORMAL HIGH (ref 8–27)
Bilirubin Total: 0.6 mg/dL (ref 0.0–1.2)
CO2: 20 mmol/L (ref 20–29)
Calcium: 9.5 mg/dL (ref 8.6–10.2)
Chloride: 104 mmol/L (ref 96–106)
Creatinine, Ser: 1.5 mg/dL — ABNORMAL HIGH (ref 0.76–1.27)
Globulin, Total: 1.6 g/dL (ref 1.5–4.5)
Glucose: 108 mg/dL — ABNORMAL HIGH (ref 70–99)
Potassium: 5.1 mmol/L (ref 3.5–5.2)
Sodium: 139 mmol/L (ref 134–144)
Total Protein: 6.3 g/dL (ref 6.0–8.5)
eGFR: 51 mL/min/1.73 — ABNORMAL LOW

## 2021-08-17 LAB — HEMOGLOBIN A1C
Est. average glucose Bld gHb Est-mCnc: 126 mg/dL
Hgb A1c MFr Bld: 6 % — ABNORMAL HIGH (ref 4.8–5.6)

## 2021-08-17 LAB — CARDIOVASCULAR RISK ASSESSMENT

## 2021-08-17 LAB — TESTOSTERONE,FREE AND TOTAL
Testosterone, Free: 13.1 pg/mL (ref 6.6–18.1)
Testosterone: 587 ng/dL (ref 264–916)

## 2021-08-17 LAB — PSA: Prostate Specific Ag, Serum: 5.5 ng/mL — ABNORMAL HIGH (ref 0.0–4.0)

## 2021-08-19 ENCOUNTER — Other Ambulatory Visit: Payer: Self-pay | Admitting: Legal Medicine

## 2021-08-19 NOTE — Progress Notes (Signed)
Glucose 108, kidney tests stage 3a, liver tests normal, A1c 6.0, triglycerides high 231, PSA 5.5, cbc low platelets, testosterone free 13.1 normal lp

## 2021-08-22 ENCOUNTER — Other Ambulatory Visit: Payer: Self-pay | Admitting: Endocrinology

## 2021-08-25 LAB — HM DIABETES EYE EXAM

## 2021-08-29 ENCOUNTER — Encounter: Payer: Self-pay | Admitting: Legal Medicine

## 2021-08-30 LAB — HM COLONOSCOPY

## 2021-09-11 ENCOUNTER — Ambulatory Visit (INDEPENDENT_AMBULATORY_CARE_PROVIDER_SITE_OTHER): Payer: 59

## 2021-09-11 ENCOUNTER — Other Ambulatory Visit: Payer: Self-pay

## 2021-09-11 DIAGNOSIS — E291 Testicular hypofunction: Secondary | ICD-10-CM

## 2021-09-11 NOTE — Progress Notes (Signed)
   Testosterone injection given per order, patient tolerated well.   [name], LPN 1:85 AM

## 2021-09-19 ENCOUNTER — Ambulatory Visit: Payer: 59 | Admitting: Endocrinology

## 2021-09-29 ENCOUNTER — Ambulatory Visit: Payer: 59 | Admitting: Endocrinology

## 2021-09-29 ENCOUNTER — Encounter: Payer: Self-pay | Admitting: Endocrinology

## 2021-09-29 ENCOUNTER — Other Ambulatory Visit: Payer: Self-pay

## 2021-09-29 VITALS — BP 140/72 | HR 72 | Ht 71.0 in | Wt 293.8 lb

## 2021-09-29 DIAGNOSIS — E1122 Type 2 diabetes mellitus with diabetic chronic kidney disease: Secondary | ICD-10-CM | POA: Diagnosis not present

## 2021-09-29 DIAGNOSIS — N1831 Chronic kidney disease, stage 3a: Secondary | ICD-10-CM

## 2021-09-29 MED ORDER — REPAGLINIDE 2 MG PO TABS: 2.0000 mg | ORAL_TABLET | Freq: Three times a day (TID) | ORAL | 3 refills | Status: DC

## 2021-09-29 MED ORDER — EMPAGLIFLOZIN 25 MG PO TABS: ORAL_TABLET | ORAL | 3 refills | Status: DC

## 2021-09-29 NOTE — Progress Notes (Signed)
Subjective:    Patient ID: Brent Duarte, male    DOB: [birth year], 65 y.o.   MRN: [id number]  HPI Pt returns for f/u of diabetes mellitus: DM type: 2 Dx'ed: 0000000 Complications: PN and stage 3b CRI.   Therapy: 5 oral meds DKA: never Severe hypoglycemia: never Pancreatitis: once, in 2018 Pancreatic imaging: none available.  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  Other: he has never been on insulin, but he knows how; edema limits rx options; CRI limits rx options and dosages; he did not tolerate bromocriptine (n/v), trulicity (pancreatitis), or acarbose (diarrhea); he declines bariatric surgery.   Interval history: Meter is downloaded today, and the printout is scanned into the record. Cbg varies from 90-210.  There is no trend throughout the day, but there are only 7 cbg's.   Past Medical History:  Diagnosis Date   Arthritis    BPH without obstruction/lower urinary tract symptoms 02/01/2020   History of chicken pox    History of kidney stones    History of measles    History of mumps    Hyperlipidemia    Idiopathic gout of right knee 02/01/2020   Imbalance    secondary to back issues   Multiple gastric ulcers    Numbness and tingling of both legs    Pneumonia    Psoriasis vulgaris 02/01/2020   Recurrent falls     Past Surgical History:  Procedure Laterality Date   bicep tear     right lower arm related to overgrowth of bone as stated per pt    KNEE ARTHROSCOPY     bilat   lithrotripsy     for kidney stones   LUMBAR LAMINECTOMY/DECOMPRESSION MICRODISCECTOMY N/A 09/07/2015   Procedure: MICRO LUMBAR DECOMPRESSION L4 - L5, REMOVAL OF SYNOVIAL CYST L4-L5;  Surgeon: Susa Day, MD;  Location: [facility];  Service: Orthopedics;  Laterality: N/A;   URETERAL STENT PLACEMENT     secondary to kidney stones/pt states had laser surgery prior     Social History    Socioeconomic History   Marital status: Married    Spouse name: Not on file   Number of children: Not on file   Years of education: Not on file   Highest education level: Not on file  Occupational History    Employer: [employer]   Tobacco Use   Smoking status: Never   Smokeless tobacco: Never  Substance and Sexual Activity   Alcohol use: No   Drug use: No   Sexual activity: Yes    Partners: Female  Other Topics Concern   Not on file  Social History Narrative   Not on file   Social Determinants of Health   Financial Resource Strain: Not on file  Food Insecurity: Not on file  Transportation Needs: Not on file  Physical Activity: Not on file  Stress: Not on file  Social Connections: Not on file  Intimate Partner Violence: Not on file    Current Outpatient Medications on File Prior to Visit  Medication Sig Dispense Refill   acetaminophen (TYLENOL) 500 MG tablet Tylenol Extra Strength     allopurinol (ZYLOPRIM) 300 MG tablet TAKE 1 TABLET BY MOUTH EVERYDAY AT BEDTIME 90 tablet 2   amoxicillin-clavulanate (AUGMENTIN) 875-125 MG tablet Take 1 tablet by mouth 2 (two) times daily. 20 tablet 0   atorvastatin (LIPITOR) 40 MG tablet TAKE  1 TABLET BY MOUTH EVERY DAY 90 tablet 2   B-D 3CC LUER-LOK SYR 25GX1" 25G X 1" 3 ML MISC USE 1 ML BY DOES NOT APPLY ROUTE EVERY 14 (FOURTEEN) DAYS. ( 1 AND 1/2 INCH ON BACKORDER)     Chlorpheniramine-Phenylephrine 4-10 MG tablet Take 1 tablet by mouth every 4 (four) hours as needed for congestion. 40 tablet 1   colesevelam (WELCHOL) 625 MG tablet TAKE 3 TABLETS (1,875 MG TOTAL) BY MOUTH DAILY. 270 tablet 3   dupilumab (DUPIXENT) 300 MG/2ML prefilled syringe Inject 300 mg into the skin every 14 (fourteen) days.     gabapentin (NEURONTIN) 300 MG capsule Take 300 mg by mouth as needed.      glucose blood (ONETOUCH VERIO) test strip 1 each by Other route daily. Use as instructed 100 each 3   lisinopril (ZESTRIL) 20 MG tablet TAKE 1 TABLET BY  MOUTH EVERY DAY AS DIRECTED 90 tablet 2   metFORMIN (GLUCOPHAGE-XR) 500 MG 24 hr tablet TAKE 2 TABLETS BY MOUTH EVERY DAY WITH BREAKFAST 180 tablet 1   omega-3 acid ethyl esters (LOVAZA) 1 g capsule TAKE 2 CAPSULES BY MOUTH TWICE A DAY 360 capsule 2   oxyCODONE-acetaminophen (PERCOCET) 10-325 MG tablet Take 1 tablet by mouth every 4 (four) hours as needed for pain. 60 tablet 0   pioglitazone (ACTOS) 15 MG tablet Take 1 tablet (15 mg total) by mouth daily. 90 tablet 3   PRESCRIPTION MEDICATION Apply 1 application topically 2 (two) times daily. Calcitriol ointment 69mcg/g. [facility] [place].      Syringe/Needle, Disp, (SYRINGE 3CC/25GX1-1/2") 25G X 1-1/2" 3 ML MISC 1 mL by Does not apply route every 14 (fourteen) days. 100 each 0   testosterone cypionate (DEPOTESTOSTERONE CYPIONATE) 200 MG/ML injection INJECT 1ML INTRAMUSCULARLY ONCE A MONTH 3 mL 3   Current Facility-Administered Medications on File Prior to Visit  Medication Dose Route Frequency Provider Last Rate Last Admin   testosterone cypionate (DEPOTESTOSTERONE CYPIONATE) injection 200 mg  200 mg Intramuscular Q14 Days Lillard Anes, MD   200 mg at 05/10/21 [id number]   testosterone cypionate (DEPOTESTOSTERONE CYPIONATE) injection 200 mg  200 mg Intramuscular Q28 days Lillard Anes, MD   200 mg at 09/11/21 [id number]    Allergies  Allergen Reactions   Bromocriptine Other (See Comments)    GI issues   Ciprofloxacin Other (See Comments)    "Tendon issues"   Trulicity [Dulaglutide] Other (See Comments)    GI issues    Family History  Problem Relation Age of Onset   Lung cancer Mother    Lung cancer Father    Diabetes Neg Hx     BP 140/72 (BP Location: Left Arm, Patient Position: Sitting, Cuff Size: Normal)    Pulse 72    Ht 5\' 11"  (1.803 m)    Wt 293 lb 12.8 oz (133.3 kg)    SpO2 95%    BMI 40.98 kg/m    Review of Systems He denies hypoglycemia.      Objective:   Physical Exam Pulses: dorsalis pedis intact  bilat.   MSK: no deformity of the feet CV: 1+ bilat leg edema Skin:  no ulcer on the feet.  normal temp on the feet, but there is patchy hyperpigmentation Neuro: sensation is intact to touch on the feet.   Ext: there is bilateral onychomycosis of the toenails.     Lab Results  Component Value Date   HGBA1C 6.0 (H) 08/16/2021      Assessment &  Plan:  Type 2 DM: overcontrolled.  I have sent a prescription to your pharmacy, to reduce repaglinide

## 2021-10-09 ENCOUNTER — Other Ambulatory Visit: Payer: Self-pay | Admitting: Legal Medicine

## 2021-10-09 DIAGNOSIS — E291 Testicular hypofunction: Secondary | ICD-10-CM

## 2021-10-17 ENCOUNTER — Ambulatory Visit (INDEPENDENT_AMBULATORY_CARE_PROVIDER_SITE_OTHER): Payer: 59

## 2021-10-17 DIAGNOSIS — E291 Testicular hypofunction: Secondary | ICD-10-CM

## 2021-10-17 NOTE — Progress Notes (Signed)
Patient in office for testosterone injection, tolerated well.   Brent Duarte 10/17/21 8:36 AM

## 2021-10-28 ENCOUNTER — Other Ambulatory Visit: Payer: Self-pay | Admitting: Endocrinology

## 2021-11-14 ENCOUNTER — Other Ambulatory Visit: Payer: Self-pay

## 2021-11-14 ENCOUNTER — Ambulatory Visit (INDEPENDENT_AMBULATORY_CARE_PROVIDER_SITE_OTHER): Payer: 59

## 2021-11-14 DIAGNOSIS — E291 Testicular hypofunction: Secondary | ICD-10-CM

## 2021-11-14 NOTE — Progress Notes (Signed)
° °  Testosterone injection given per order, patient tolerated well.   Lakayla Barrington M Azucena Dart, LPN 8:56 AM  

## 2021-12-10 ENCOUNTER — Other Ambulatory Visit: Payer: Self-pay | Admitting: Legal Medicine

## 2021-12-14 ENCOUNTER — Ambulatory Visit (INDEPENDENT_AMBULATORY_CARE_PROVIDER_SITE_OTHER): Payer: 59

## 2021-12-14 DIAGNOSIS — E291 Testicular hypofunction: Secondary | ICD-10-CM | POA: Diagnosis not present

## 2021-12-14 NOTE — Progress Notes (Signed)
Patient in office for monthly testosterone injection. Tolerated in Left hip.  ? ?Brent Duarte 12/14/21 1:51 PM ? ?

## 2021-12-18 DIAGNOSIS — M4300 Spondylolysis, site unspecified: Secondary | ICD-10-CM | POA: Insufficient documentation

## 2021-12-18 NOTE — Progress Notes (Signed)
Subjective:  Patient ID: Brent Duarte, male    DOB: Jun 27, 1956  Age: 66 y.o. MRN: [id number]  Chief Complaint  Patient presents with   Diabetes   Hypertension   Hyperlipidemia    HPI   Diabetes: Patient present with type 2 diabetes.  Specifically, this is type 2,  noninsulin requiring diabetes. Compliance with treatment has been good; patient take medicines as directed, maintains diet and exercise regimen, follows up as directed, and is keeping glucose diary. Current medicines for diabetes Prandin, Actos 15 mg daily, Jardiance 25 mg daily, metformin 500 mg 2 tablets daily.  Patient performs foot exams daily and last ophthalmologic exam was 08/25/2021. Last A1C 6.0%.  Patient presents with hyperlipidemia.  Compliance with treatment has been good; patient takes medicines as directed, maintains low cholesterol diet, follows up as directed, and maintains exercise regimen.  Patient is using without problems Atorvastatin 40 mg daily, Lovaza 2 g twice a day   Gout: He takes allopurinol 300 mg daily.  Hypertension: Patient is taking Lisinopril 20 mg daily. Patient presents for follow up of hypertension.  Patient tolerating lisinopril well with side effects.  Patient was diagnosed with hypertension 2010 so has been treated for hypertension for 12 years.Patient is working on maintaining diet and exercise regimen and follows up as directed. Complication include none.    Current Outpatient Medications on File Prior to Visit  Medication Sig Dispense Refill   acetaminophen (TYLENOL) 500 MG tablet Tylenol Extra Strength     allopurinol (ZYLOPRIM) 300 MG tablet TAKE 1 TABLET BY MOUTH EVERYDAY AT BEDTIME 90 tablet 2   atorvastatin (LIPITOR) 40 MG tablet TAKE 1 TABLET BY MOUTH EVERY DAY 90 tablet 2   B-D 3CC LUER-LOK SYR 25GX1" 25G X 1" 3 ML MISC USE 1 ML BY DOES NOT APPLY ROUTE EVERY 14 (FOURTEEN) DAYS. ( 1 AND 1/2 INCH ON BACKORDER)     Chlorpheniramine-Phenylephrine 4-10 MG tablet Take 1 tablet by  mouth every 4 (four) hours as needed for congestion. 40 tablet 1   colesevelam (WELCHOL) 625 MG tablet TAKE 3 TABLETS (1,875 MG TOTAL) BY MOUTH DAILY. 270 tablet 3   dupilumab (DUPIXENT) 300 MG/2ML prefilled syringe Inject 300 mg into the skin every 14 (fourteen) days.     empagliflozin (JARDIANCE) 25 MG TABS tablet TAKE 1 TABLET BY MOUTH EVERY DAY BEFORE BREAKFAST 90 tablet 3   gabapentin (NEURONTIN) 300 MG capsule Take 300 mg by mouth as needed.      glucose blood (ONETOUCH VERIO) test strip 1 each by Other route daily. Use as instructed 100 each 3   lisinopril (ZESTRIL) 20 MG tablet TAKE 1 TABLET BY MOUTH EVERY DAY AS DIRECTED 90 tablet 2   metFORMIN (GLUCOPHAGE-XR) 500 MG 24 hr tablet TAKE 2 TABLETS BY MOUTH EVERY DAY WITH BREAKFAST 180 tablet 1   omega-3 acid ethyl esters (LOVAZA) 1 g capsule TAKE 2 CAPSULES BY MOUTH TWICE A DAY 360 capsule 2   oxyCODONE-acetaminophen (PERCOCET) 10-325 MG tablet Take 1 tablet by mouth every 4 (four) hours as needed for pain. 60 tablet 0   pioglitazone (ACTOS) 15 MG tablet Take 1 tablet (15 mg total) by mouth daily. 90 tablet 3   PRESCRIPTION MEDICATION Apply 1 application topically 2 (two) times daily. Calcitriol ointment 48mcg/g. [facility] [place].      repaglinide (PRANDIN) 2 MG tablet Take 1 tablet (2 mg total) by mouth 3 (three) times daily before meals. 270 tablet 3   Syringe/Needle, Disp, (SYRINGE 3CC/25GX1-1/2")  25G X 1-1/2" 3 ML MISC 1 mL by Does not apply route every 14 (fourteen) days. 100 each 0   testosterone cypionate (DEPOTESTOSTERONE CYPIONATE) 200 MG/ML injection INJECT 1ML INTRAMUSCULARLY ONCE A MONTH 3 mL 5   Current Facility-Administered Medications on File Prior to Visit  Medication Dose Route Frequency Provider Last Rate Last Admin   testosterone cypionate (DEPOTESTOSTERONE CYPIONATE) injection 200 mg  200 mg Intramuscular Q28 days Lillard Anes, MD   200 mg at 12/14/21 1349   Past Medical History:  Diagnosis Date    Arthritis    BPH without obstruction/lower urinary tract symptoms 02/01/2020   History of chicken pox    History of kidney stones    History of measles    History of mumps    Hyperlipidemia    Idiopathic gout of right knee 02/01/2020   Imbalance    secondary to back issues   Multiple gastric ulcers    Numbness and tingling of both legs    Pneumonia    Psoriasis vulgaris 02/01/2020   Recurrent falls    Past Surgical History:  Procedure Laterality Date   bicep tear     right lower arm related to overgrowth of bone as stated per pt    KNEE ARTHROSCOPY     bilat   lithrotripsy     for kidney stones   LUMBAR LAMINECTOMY/DECOMPRESSION MICRODISCECTOMY N/A 09/07/2015   Procedure: MICRO LUMBAR DECOMPRESSION L4 - L5, REMOVAL OF SYNOVIAL CYST L4-L5;  Surgeon: Susa Day, MD;  Location: [facility];  Service: Orthopedics;  Laterality: N/A;   URETERAL STENT PLACEMENT     secondary to kidney stones/pt states had laser surgery prior     Family History  Problem Relation Age of Onset   Lung cancer Mother    Lung cancer Father    Diabetes Neg Hx    Social History   Socioeconomic History   Marital status: Married    Spouse name: Not on file   Number of children: Not on file   Years of education: Not on file   Highest education level: Not on file  Occupational History    Employer: [employer]   Tobacco Use   Smoking status: Never   Smokeless tobacco: Never  Substance and Sexual Activity   Alcohol use: No   Drug use: No   Sexual activity: Yes    Partners: Female  Other Topics Concern   Not on file  Social History Narrative   Not on file   Social Determinants of Health   Financial Resource Strain: Not on file  Food Insecurity: Not on file  Transportation Needs: Not on file  Physical Activity: Not on file  Stress: Not on file  Social Connections: Not on file    Review of Systems  Constitutional:  Negative for chills, diaphoresis, fatigue and fever.  HENT:  Negative for  congestion, ear pain and sore throat.   Eyes:  Negative for visual disturbance.  Respiratory:  Negative for cough and shortness of breath.   Cardiovascular:  Negative for chest pain and leg swelling.  Gastrointestinal:  Negative for abdominal pain, constipation, diarrhea, nausea and vomiting.  Endocrine: Negative for polyuria.  Genitourinary:  Negative for dysuria and urgency.  Musculoskeletal:  Negative for arthralgias and myalgias.  Skin: Negative.   Neurological:  Negative for dizziness and headaches.  Psychiatric/Behavioral:  Negative for dysphoric mood.     Objective:  BP 118/74    Pulse 92    Temp (!) 96.1 F (  35.6 C)    Ht 5\' 11"  (1.803 m)    Wt 300 lb (136.1 kg)    SpO2 97%    BMI 41.84 kg/m   BP/Weight 12/19/2021 09/29/2021 [id number]  Systolic BP 123456 [id number] 123456  Diastolic BP 74 72 76  Wt. (Lbs) 300 293.8 291.2  BMI 41.84 40.98 40.61    Physical Exam Vitals reviewed.  Constitutional:      General: He is not in acute distress.    Appearance: Normal appearance.  HENT:     Head: Normocephalic.     Right Ear: Tympanic membrane normal.     Left Ear: Tympanic membrane normal.     Mouth/Throat:     Mouth: Mucous membranes are moist.     Pharynx: Oropharynx is clear.  Eyes:     Conjunctiva/sclera: Conjunctivae normal.     Pupils: Pupils are equal, round, and reactive to light.  Cardiovascular:     Rate and Rhythm: Normal rate and regular rhythm.     Pulses: Normal pulses.     Heart sounds: Normal heart sounds. No murmur heard.   No gallop.  Pulmonary:     Effort: Pulmonary effort is normal. No respiratory distress.     Breath sounds: No wheezing.  Abdominal:     General: Abdomen is flat. Bowel sounds are normal. There is no distension.     Tenderness: There is no abdominal tenderness.  Musculoskeletal:        General: Normal range of motion.     Cervical back: Normal range of motion.     Right lower leg: No edema.     Left lower leg: No edema.  Skin:    General:  Skin is warm.     Capillary Refill: Capillary refill takes less than 2 seconds.  Neurological:     General: No focal deficit present.     Mental Status: He is alert and oriented to person, place, and time. Mental status is at baseline.     Gait: Gait normal.     Deep Tendon Reflexes: Reflexes normal.    Diabetic Foot Exam - Simple   Simple Foot Form Diabetic Foot exam was performed with the following findings: Yes 12/19/2021  8:03 AM  Visual Inspection No deformities, no ulcerations, no other skin breakdown bilaterally: Yes Sensation Testing See comments: Yes Pulse Check Posterior Tibialis and Dorsalis pulse intact bilaterally: Yes Comments Numbness in toes      Lab Results  Component Value Date   WBC 5.3 08/16/2021   HGB 15.7 08/16/2021   HCT 46.0 08/16/2021   PLT 123 (L) 08/16/2021   GLUCOSE 108 (H) 08/16/2021   CHOL 151 08/16/2021   TRIG 231 (H) 08/16/2021   HDL 37 (L) 08/16/2021   LDLCALC 76 08/16/2021   ALT 23 08/16/2021   AST 18 08/16/2021   NA 139 08/16/2021   K 5.1 08/16/2021   CL 104 08/16/2021   CREATININE 1.50 (H) 08/16/2021   BUN 35 (H) 08/16/2021   CO2 20 08/16/2021   TSH 1.760 07/27/2020   HGBA1C 6.0 (H) 08/16/2021      Assessment & Plan:   Problem List Items Addressed This Visit       Cardiovascular and Mediastinum   Essential hypertension - Primary   Relevant Orders   Comprehensive metabolic panel   CBC with Differential/Platelet An individual hypertension care plan was established and reinforced today.  The patient's status was assessed using clinical findings on exam and labs or diagnostic tests. The patient's  success at meeting treatment goals on disease specific evidence-based guidelines and found to be well controlled. SELF MANAGEMENT: The patient and I together assessed ways to personally work towards obtaining the recommended goals. RECOMMENDATIONS: avoid decongestants found in common cold remedies, decrease consumption of alcohol,  perform routine monitoring of BP with home BP cuff, exercise, reduction of dietary salt, take medicines as prescribed, try not to miss doses and quit smoking.  Regular exercise and maintaining a healthy weight is needed.  Stress reduction may help. A CLINICAL SUMMARY including written plan identify barriers to care unique to individual due to social or financial issues.  We attempt to mutually creat solutions for individual and family understanding.       Endocrine   Hypogonadism in male   Relevant Orders   Testosterone,Free and Total Patient is stable on testosterone    Type 2 diabetes mellitus with stage 3a chronic kidney disease, without long-term current use of insulin (HCC)   Relevant Orders   Hemoglobin A1c   Microalbumin / creatinine urine ratio An individual care plan for diabetes was established and reinforced today.  The patient's status was assessed using clinical findings on exam, labs and diagnostic testing. Patient success at meeting goals based on disease specific evidence-based guidelines and found to be good controlled. Medications were assessed and patient's understanding of the medical issues , including barriers were assessed. Recommend adherence to a diabetic diet, a graduated exercise program, HgbA1c level is checked quarterly, and urine microalbumin performed yearly .  Annual mono-filament sensation testing performed. Lower blood pressure and control hyperlipidemia is important. Get annual eye exams and annual flu shots and smoking cessation discussed.  Self management goals were discussed.      Genitourinary   BPH without obstruction/lower urinary tract symptoms   Relevant Orders   PSA AN INDIVIDUAL CARE PLAN For BPH was established and reinforced today.  The patient's status was assessed using clinical findings on exam, labs, and other diagnostic testing. Patient's success at meeting treatment goals based on disease specific evidence-bassed guidelines and found to be in  good control. RECOMMENDATIONS include maintaining present medicines and treatment.      Other   Mixed hyperlipidemia   Relevant Orders   Lipid panel AN INDIVIDUAL CARE PLAN for hyperlipidemia/ cholesterol was established and reinforced today.  The patient's status was assessed using clinical findings on exam, lab and other diagnostic tests. The patient's disease status was assessed based on evidence-based guidelines and found to be fair controlled. MEDICATIONS were reviewed. SELF MANAGEMENT GOALS have been discussed and patient's success at attaining the goal of low cholesterol was assessed. RECOMMENDATION given include regular exercise 3 days a week and low cholesterol/low fat diet. CLINICAL SUMMARY including written plan to identify barriers unique to the patient due to social or economic  reasons was discussed.    Long term (current) use of opiate analgesic Chronic back pain, pain is still 8 to 9/10 despite injections and pain clinic    Idiopathic gout of right knee   Relevant Orders   Uric acid No recent gout    Morbid obesity ([place]) An individualize plan was formulated for obesity using patient history and physical exam to encourage weight loss.  An evidence based program was formulated.  Patient is to cut portion size with meals and to plan physical exercise 3 days a week at least 20 minutes.  Weight watchers and other programs are helpful.  Planned amount of weight loss 10 lbs.    BMI 40.0-44.9, adult (  [place]) An individualize plan was formulated for obesity using patient history and physical exam to encourage weight loss.  An evidence based program was formulated.  Patient is to cut portion size with meals and to plan physical exercise 3 days a week at least 20 minutes.  Weight watchers and other programs are helpful.  Planned amount of weight loss 10 lbs.   .    Orders Placed This Encounter  Procedures   Comprehensive metabolic panel   Hemoglobin A1c   Microalbumin / creatinine  urine ratio   PSA   CBC with Differential/Platelet   Lipid panel   Uric acid   Testosterone,Free and Total   40 minute visit with review of records  Follow-up: Return in about 4 months (around 04/20/2022) for fasting.  An After Visit Summary was printed and given to the patient.  Reinaldo Meeker, MD [facility] [phone]

## 2021-12-19 ENCOUNTER — Ambulatory Visit: Payer: 59 | Admitting: Legal Medicine

## 2021-12-19 ENCOUNTER — Encounter: Payer: Self-pay | Admitting: Legal Medicine

## 2021-12-19 ENCOUNTER — Other Ambulatory Visit: Payer: Self-pay

## 2021-12-19 VITALS — BP 118/74 | HR 92 | Temp 96.1°F | Ht 71.0 in | Wt 300.0 lb

## 2021-12-19 DIAGNOSIS — E1122 Type 2 diabetes mellitus with diabetic chronic kidney disease: Secondary | ICD-10-CM | POA: Diagnosis not present

## 2021-12-19 DIAGNOSIS — N400 Enlarged prostate without lower urinary tract symptoms: Secondary | ICD-10-CM

## 2021-12-19 DIAGNOSIS — E782 Mixed hyperlipidemia: Secondary | ICD-10-CM

## 2021-12-19 DIAGNOSIS — I10 Essential (primary) hypertension: Secondary | ICD-10-CM | POA: Diagnosis not present

## 2021-12-19 DIAGNOSIS — Z79891 Long term (current) use of opiate analgesic: Secondary | ICD-10-CM

## 2021-12-19 DIAGNOSIS — E291 Testicular hypofunction: Secondary | ICD-10-CM

## 2021-12-19 DIAGNOSIS — N1831 Chronic kidney disease, stage 3a: Secondary | ICD-10-CM

## 2021-12-19 DIAGNOSIS — Z6841 Body Mass Index (BMI) 40.0 and over, adult: Secondary | ICD-10-CM

## 2021-12-19 DIAGNOSIS — M1A0610 Idiopathic chronic gout, right knee, without tophus (tophi): Secondary | ICD-10-CM

## 2021-12-24 LAB — CBC WITH DIFFERENTIAL/PLATELET
Basophils Absolute: 0 10*3/uL (ref 0.0–0.2)
Basos: 1 %
EOS (ABSOLUTE): 0.2 10*3/uL (ref 0.0–0.4)
Eos: 3 %
Hematocrit: 48.3 % (ref 37.5–51.0)
Hemoglobin: 16 g/dL (ref 13.0–17.7)
Immature Grans (Abs): 0.1 10*3/uL (ref 0.0–0.1)
Immature Granulocytes: 1 %
Lymphocytes Absolute: 1.2 10*3/uL (ref 0.7–3.1)
Lymphs: 21 %
MCH: 31.3 pg (ref 26.6–33.0)
MCHC: 33.1 g/dL (ref 31.5–35.7)
MCV: 94 fL (ref 79–97)
Monocytes Absolute: 0.5 10*3/uL (ref 0.1–0.9)
Monocytes: 8 %
Neutrophils Absolute: 3.9 10*3/uL (ref 1.4–7.0)
Neutrophils: 66 %
Platelets: 146 10*3/uL — ABNORMAL LOW (ref 150–450)
RBC: 5.12 x10E6/uL (ref 4.14–5.80)
RDW: 14.4 % (ref 11.6–15.4)
WBC: 5.8 10*3/uL (ref 3.4–10.8)

## 2021-12-24 LAB — TESTOSTERONE,FREE AND TOTAL
Testosterone, Free: 22.6 pg/mL — ABNORMAL HIGH (ref 6.6–18.1)
Testosterone: 681 ng/dL (ref 264–916)

## 2021-12-24 LAB — COMPREHENSIVE METABOLIC PANEL
ALT: 23 IU/L (ref 0–44)
AST: 15 IU/L (ref 0–40)
Albumin/Globulin Ratio: 2.4 — ABNORMAL HIGH (ref 1.2–2.2)
Albumin: 4.6 g/dL (ref 3.8–4.8)
Alkaline Phosphatase: 93 IU/L (ref 44–121)
BUN/Creatinine Ratio: 19 (ref 10–24)
BUN: 25 mg/dL (ref 8–27)
Bilirubin Total: 0.6 mg/dL (ref 0.0–1.2)
CO2: 20 mmol/L (ref 20–29)
Calcium: 9.7 mg/dL (ref 8.6–10.2)
Chloride: 107 mmol/L — ABNORMAL HIGH (ref 96–106)
Creatinine, Ser: 1.34 mg/dL — ABNORMAL HIGH (ref 0.76–1.27)
Globulin, Total: 1.9 g/dL (ref 1.5–4.5)
Glucose: 153 mg/dL — ABNORMAL HIGH (ref 70–99)
Potassium: 5.1 mmol/L (ref 3.5–5.2)
Sodium: 141 mmol/L (ref 134–144)
Total Protein: 6.5 g/dL (ref 6.0–8.5)
eGFR: 58 mL/min/{1.73_m2} — ABNORMAL LOW (ref >59)

## 2021-12-24 LAB — LIPID PANEL
Chol/HDL Ratio: 4.6 ratio (ref 0.0–5.0)
Cholesterol, Total: 147 mg/dL (ref 100–199)
HDL: 32 mg/dL — ABNORMAL LOW (ref >39)
LDL Chol Calc (NIH): 65 mg/dL (ref 0–99)
Triglycerides: 317 mg/dL — ABNORMAL HIGH (ref 0–149)
VLDL Cholesterol Cal: 50 mg/dL — ABNORMAL HIGH (ref 5–40)

## 2021-12-24 LAB — PSA: Prostate Specific Ag, Serum: 7 ng/mL — ABNORMAL HIGH (ref 0.0–4.0)

## 2021-12-24 LAB — CARDIOVASCULAR RISK ASSESSMENT

## 2021-12-24 LAB — HEMOGLOBIN A1C
Est. average glucose Bld gHb Est-mCnc: 160 mg/dL
Hgb A1c MFr Bld: 7.2 % — ABNORMAL HIGH (ref 4.8–5.6)

## 2021-12-24 LAB — MICROALBUMIN / CREATININE URINE RATIO
Creatinine, Urine: 103.6 mg/dL
Microalb/Creat Ratio: <3 mg/g creat (ref 0–29)
Microalbumin, Urine: <3 ug/mL

## 2021-12-24 LAB — URIC ACID: Uric Acid: 4.3 mg/dL (ref 3.8–8.4)

## 2021-12-24 NOTE — Progress Notes (Signed)
Glucose 153, kidney tests remain 3a, liver tests normal, A1c 7.2, PSA 7.0 higher, triglycerides high 317, free testosterone high- one week after injection, may be causing increased PSA, platelets 146 improving. Microalbuminuria normal, has he followed up with [facility]? ?lp

## 2021-12-28 ENCOUNTER — Other Ambulatory Visit: Payer: Self-pay | Admitting: Urology

## 2022-01-15 ENCOUNTER — Ambulatory Visit (INDEPENDENT_AMBULATORY_CARE_PROVIDER_SITE_OTHER): Payer: 59

## 2022-01-15 DIAGNOSIS — E291 Testicular hypofunction: Secondary | ICD-10-CM

## 2022-01-15 NOTE — Progress Notes (Signed)
? ?  Testosterone injection given per order, patient tolerated well.  ? ?[name], LPN ?5:00 AM ?

## 2022-01-24 ENCOUNTER — Ambulatory Visit
Admission: RE | Admit: 2022-01-24 | Discharge: 2022-01-24 | Disposition: A | Discharge status: Home / Self Care | Payer: 59 | Source: Ambulatory Visit | Attending: Urology | Admitting: Urology

## 2022-01-24 DIAGNOSIS — R972 Elevated prostate specific antigen [PSA]: Secondary | ICD-10-CM

## 2022-01-24 MED ORDER — GADOBENATE DIMEGLUMINE 529 MG/ML IV SOLN
20.0000 mL | Freq: Once | INTRAVENOUS | Status: AC | PRN
  Administered 2022-01-24: 20 mL via INTRAVENOUS

## 2022-02-12 ENCOUNTER — Ambulatory Visit (INDEPENDENT_AMBULATORY_CARE_PROVIDER_SITE_OTHER): Payer: 59

## 2022-02-12 DIAGNOSIS — E291 Testicular hypofunction: Secondary | ICD-10-CM | POA: Diagnosis not present

## 2022-02-12 NOTE — Progress Notes (Signed)
? ?  Testosterone injection given per order, patient tolerated well.  ? ?[name], LPN ?35:45 AM ? ?

## 2022-02-25 ENCOUNTER — Other Ambulatory Visit: Payer: Self-pay | Admitting: Endocrinology

## 2022-03-01 ENCOUNTER — Ambulatory Visit: Payer: 59 | Admitting: Endocrinology

## 2022-03-09 ENCOUNTER — Other Ambulatory Visit: Payer: Self-pay | Admitting: Endocrinology

## 2022-03-09 ENCOUNTER — Other Ambulatory Visit (INDEPENDENT_AMBULATORY_CARE_PROVIDER_SITE_OTHER): Payer: 59

## 2022-03-09 DIAGNOSIS — E1165 Type 2 diabetes mellitus with hyperglycemia: Secondary | ICD-10-CM | POA: Diagnosis not present

## 2022-03-09 LAB — BASIC METABOLIC PANEL
BUN: 42 mg/dL — ABNORMAL HIGH (ref 6–23)
CO2: 27 mEq/L (ref 19–32)
Calcium: 9.5 mg/dL (ref 8.4–10.5)
Chloride: 105 mEq/L (ref 96–112)
Creatinine, Ser: 1.51 mg/dL — ABNORMAL HIGH (ref 0.40–1.50)
GFR: 47.85 mL/min — ABNORMAL LOW (ref >60.00)
Glucose, Bld: 121 mg/dL — ABNORMAL HIGH (ref 70–99)
Potassium: 5.4 mEq/L — ABNORMAL HIGH (ref 3.5–5.1)
Sodium: 139 mEq/L (ref 135–145)

## 2022-03-09 LAB — HEMOGLOBIN A1C: Hgb A1c MFr Bld: 6.2 % (ref 4.6–6.5)

## 2022-03-14 ENCOUNTER — Ambulatory Visit (INDEPENDENT_AMBULATORY_CARE_PROVIDER_SITE_OTHER): Payer: 59

## 2022-03-14 ENCOUNTER — Ambulatory Visit: Payer: 59 | Admitting: Endocrinology

## 2022-03-14 ENCOUNTER — Encounter: Payer: Self-pay | Admitting: Endocrinology

## 2022-03-14 VITALS — BP 118/70 | HR 75 | Ht 71.0 in | Wt 298.6 lb

## 2022-03-14 DIAGNOSIS — E875 Hyperkalemia: Secondary | ICD-10-CM

## 2022-03-14 DIAGNOSIS — E291 Testicular hypofunction: Secondary | ICD-10-CM | POA: Diagnosis not present

## 2022-03-14 DIAGNOSIS — I10 Essential (primary) hypertension: Secondary | ICD-10-CM | POA: Diagnosis not present

## 2022-03-14 DIAGNOSIS — E1165 Type 2 diabetes mellitus with hyperglycemia: Secondary | ICD-10-CM

## 2022-03-14 MED ORDER — RYBELSUS 3 MG PO TABS: 3.0000 mg | ORAL_TABLET | Freq: Every day | ORAL | 0 refills | Status: DC

## 2022-03-14 NOTE — Patient Instructions (Addendum)
Check blood sugars on waking up 3 days a week  Also check blood sugars about 2 hours after meals and do this after different meals by rotation  Recommended blood sugar levels on waking up are 90-130 and about 2 hours after meal is 130-160  Please bring your blood sugar monitor to each visit, thank you   Repaglanide only at meals   Rybelsus with sample  x 30 days then 7mg  daily before 1st meals  Rybelsus improves blood sugar control as well as can help with weight loss and reduces cardiovascular events. Need to take the capsules on empty stomach 30 minutes before breakfast with 4 ounces of water daily.   You may feel more fullness at mealtimes and try to keep portions at meals smaller Some people may have nausea or even vomiting that may occur in the first few days; usually the symptoms go away with time. Please call if nausea or vomiting does not improve within 2 weeks  Lisinopril 1/2 tab

## 2022-03-14 NOTE — Progress Notes (Signed)
Testosterone injection given, patient tolerated well.   Lorita Officer, CCMA 03/14/22 9:20 AM

## 2022-03-14 NOTE — Progress Notes (Addendum)
Patient ID:Brent Duarte, male   DOB: [birth year], 66 y.o.   MRN: [id number]           Reason for Appointment: Type II Diabetes follow-up   History of Present Illness   Diagnosis date:   Previous history: He has been on Prandin since 2020 Actos was added in 8/22 Previous A1c range 6-9.8, highest in 2/22  Recent history:     Non-insulin hypoglycemic drugs: Jardiance 25 mg daily, Actos 15 mg daily, metformin ER 1000 mg daily, Prandin 2 mg 3 times daily     Insulin not used            Side effects from medications: Abdominal pain, vomiting from Trulicity  Current self management, blood sugar patterns and problems identified:  A1c is 6.2 compared to 7.2 in March which was done by PCP However no change in medications was done in March He only checks blood sugars in the mornings and not after meals and these are somewhat variable He says that he was told by his previous treating physician to keep his blood sugar over 130 He continues to have difficulty losing weight this year and is near his maximum weight Because of back pain and other issues he is not able to do any exercise Recently has been eating mostly 1 meal a day Frequently not eating breakfast However he still takes Prandin 3 times a day No hypoglycemia reported during the day  Exercise: no  Diet management: no bfst      Monitors blood glucose: Once a day.    Glucometer: One Touch.           Blood Glucose readings from meter download:   PRE-MEAL Fasting Lunch Dinner Bedtime Overall  Glucose range: 132-176      Mean/median: 139    139   POST-MEAL PC Breakfast PC Lunch PC Dinner  Glucose range:   ?  Mean/median:        Hypoglycemia:  none                        Dietician visit: Most recent:      Weight control: Max 304  Wt Readings from Last 3 Encounters:  03/14/22 298 lb 9.6 oz (135.4 kg)  12/19/21 300 lb (136.1 kg)  09/29/21 293 lb 12.8 oz (133.3 kg)            Diabetes labs:  Lab Results   Component Value Date   HGBA1C 6.2 03/09/2022   HGBA1C 7.2 (H) 12/19/2021   HGBA1C 6.0 (H) 08/16/2021   Lab Results  Component Value Date   LDLCALC 65 12/19/2021   CREATININE 1.51 (H) 03/09/2022     Allergies as of 03/14/2022       Reactions   Bromocriptine Other (See Comments)   GI issues   Ciprofloxacin Other (See Comments)   "Tendon issues"   Trulicity [dulaglutide] Other (See Comments)   GI issues        Medication List        Accurate as of Mar 14, 2022  8:54 PM. If you have any questions, ask your nurse or doctor.          acetaminophen 500 MG tablet Commonly known as: TYLENOL Tylenol Extra Strength   allopurinol 300 MG tablet Commonly known as: ZYLOPRIM TAKE 1 TABLET BY MOUTH EVERYDAY AT BEDTIME   atorvastatin 40 MG tablet Commonly known as: LIPITOR TAKE 1 TABLET BY MOUTH EVERY DAY   Chlorpheniramine-Phenylephrine  4-10 MG tablet Take 1 tablet by mouth every 4 (four) hours as needed for congestion.   colesevelam 625 MG tablet Commonly known as: WELCHOL TAKE 3 TABLETS (1,875 MG TOTAL) BY MOUTH DAILY.   dupilumab 300 MG/2ML prefilled syringe Commonly known as: DUPIXENT Inject 300 mg into the skin every 14 (fourteen) days.   empagliflozin 25 MG Tabs tablet Commonly known as: Jardiance TAKE 1 TABLET BY MOUTH EVERY DAY BEFORE BREAKFAST   gabapentin 300 MG capsule Commonly known as: NEURONTIN Take 300 mg by mouth as needed.   glucose blood test strip Commonly known as: OneTouch Verio 1 each by Other route daily. Use as instructed   lisinopril 20 MG tablet Commonly known as: ZESTRIL TAKE 1 TABLET BY MOUTH EVERY DAY AS DIRECTED   metFORMIN 500 MG 24 hr tablet Commonly known as: GLUCOPHAGE-XR TAKE 2 TABLETS BY MOUTH EVERY DAY WITH BREAKFAST   omega-3 acid ethyl esters 1 g capsule Commonly known as: LOVAZA TAKE 2 CAPSULES BY MOUTH TWICE A DAY   oxyCODONE-acetaminophen 10-325 MG tablet Commonly known as: Percocet Take 1 tablet by mouth  every 4 (four) hours as needed for pain.   pioglitazone 15 MG tablet Commonly known as: ACTOS Take 1 tablet (15 mg total) by mouth daily.   PRESCRIPTION MEDICATION Apply 1 application topically 2 (two) times daily. Calcitriol ointment 58mcg/g. Verified with [facility] in [place].   repaglinide 2 MG tablet Commonly known as: PRANDIN Take 1 tablet (2 mg total) by mouth 3 (three) times daily before meals.   Rybelsus 3 MG Tabs Generic drug: Semaglutide Take 3 mg by mouth daily. Started by: Reather Littler, MD   SYRINGE 3CC/25GX1-1/2" 25G X 1-1/2" 3 ML Misc 1 mL by Does not apply route every 14 (fourteen) days.   B-D 3CC LUER-LOK SYR 25GX1" 25G X 1" 3 ML Misc Generic drug: SYRINGE-NEEDLE (DISP) 3 ML USE 1 ML BY DOES NOT APPLY ROUTE EVERY 14 (FOURTEEN) DAYS. ( 1 AND 1/2 [facility] ON BACKORDER)   testosterone cypionate 200 MG/ML injection Commonly known as: DEPOTESTOSTERONE CYPIONATE INJECT INTRAMUSCULARLY ONCE A MONTH        Allergies:  Allergies  Allergen Reactions   Bromocriptine Other (See Comments)    GI issues   Ciprofloxacin Other (See Comments)    "Tendon issues"   Trulicity [Dulaglutide] Other (See Comments)    GI issues    Past Medical History:  Diagnosis Date   Arthritis    BPH without obstruction/lower urinary tract symptoms 02/01/2020   History of chicken pox    History of kidney stones    History of measles    History of mumps    Hyperlipidemia    Idiopathic gout of right knee 02/01/2020   Imbalance    secondary to back issues   Multiple gastric ulcers    Numbness and tingling of both legs    Pneumonia    Psoriasis vulgaris 02/01/2020   Recurrent falls     Past Surgical History:  Procedure Laterality Date   bicep tear     right lower arm related to overgrowth of bone as stated per pt    KNEE ARTHROSCOPY     bilat   lithrotripsy     for kidney stones   LUMBAR LAMINECTOMY/DECOMPRESSION MICRODISCECTOMY N/A 09/07/2015   Procedure: MICRO LUMBAR DECOMPRESSION  L4 - L5, REMOVAL OF SYNOVIAL CYST L4-L5;  Surgeon: Jene Every, MD;  Location: [facility];  Service: Orthopedics;  Laterality: N/A;   URETERAL STENT PLACEMENT  secondary to kidney stones/pt states had laser surgery prior     Family History  Problem Relation Age of Onset   Lung cancer Mother    Lung cancer Father    Diabetes Neg Hx     Social History:  reports that he has never smoked. He has never used smokeless tobacco. He reports that he does not drink alcohol and does not use drugs.  Review of Systems:  Last diabetic eye exam date 11/22  Last foot exam date: 3/23  Hypertension: On lisinopril 20 mg  Home BP averages about 115/60, no lightheadedness or  BP Readings from Last 3 Encounters:  03/14/22 118/70  12/19/21 118/74  09/29/21 140/72    Lipids: He has been on atorvastatin since at least 2021 with good control    Lab Results  Component Value Date   CHOL 147 12/19/2021   CHOL 151 08/16/2021   CHOL 139 03/28/2021   Lab Results  Component Value Date   HDL 32 (L) 12/19/2021   HDL 37 (L) 08/16/2021   HDL 32 (L) 03/28/2021   Lab Results  Component Value Date   LDLCALC 65 12/19/2021   LDLCALC 76 08/16/2021   LDLCALC 54 03/28/2021   Lab Results  Component Value Date   TRIG 317 (H) 12/19/2021   TRIG 231 (H) 08/16/2021   TRIG 343 (H) 03/28/2021   Lab Results  Component Value Date   CHOLHDL 4.6 12/19/2021   CHOLHDL 4.1 08/16/2021   CHOLHDL 4.3 03/28/2021   No results found for: LDLDIRECT   Examination:   BP 118/70   Pulse 75   Ht 5\' 11"  (1.803 m)   Wt 298 lb 9.6 oz (135.4 kg)   SpO2 97%   BMI 41.65 kg/m   Body mass index is 41.65 kg/m.    ASSESSMENT/ PLAN:    Diabetes type 2 insulin-dependent:   Current regimen: Multiple drugs including Prandin, metformin, Actos and Jardiance  A1c now is 6.2  Blood glucose control is fair with fairly good A1c although not clear if he is having high postprandial readings Unclear whether he has benefited  from Actos which he has taken for almost a year He can likely do better with consistent control including postprandial and also achieved weight loss with GLP-1 drugs He appears to have had significant GI side effects with unknown dose of Trulicity given several years ago  He agrees to a trial of RYBELSUS 3 mg daily to see if he will tolerate this better Sample instructions are to take the medications are given  Discussed with the patient the nature of GLP-1 drugs, the actions on insulin secretion, slowing stomach emptying, reduction of appetite and reduced liver glucose production Explained that Rybelsus improves blood sugar control as well as produces weight loss and reduces cardiovascular events. Explained possible side effects especially nausea and vomiting that may occur in the first few days; usually side effects improve with time.  Patient to call if nausea or vomiting does not improve within 2 weeks Instructed to take the capsules on empty stomach 30 minutes before his first meal with 4 ounces of water daily.  Patient education material given  Follow-up with A1c and glucose monitor in 3 months, reminded him to check at least half of his readings after meals and discussed blood sugar targets Okay to have blood sugars in the 80-120 range before meals The time on his meter was adjusted to the correct time  HYPERKALEMIA: This is likely be from lisinopril For now we  will reduce the dose to half a tablet and he will watch high potassium foods To follow-up with PCP in 6 weeks  HYPERTENSION: His blood pressure is controlled but slightly below target range, asymptomatic Considering his renal dysfunction this may improve with reducing lisinopril as above also  Patient Instructions  Check blood sugars on waking up 3 days a week  Also check blood sugars about 2 hours after meals and do this after different meals by rotation  Recommended blood sugar levels on waking up are 90-130 and about 2  hours after meal is 130-160  Please bring your blood sugar monitor to each visit, thank you   Repaglanide only at meals   Rybelsus with sample  x 30 days then 7mg  daily before 1st meals  Rybelsus improves blood sugar control as well as can help with weight loss and reduces cardiovascular events. Need to take the capsules on empty stomach 30 minutes before breakfast with 4 ounces of water daily.   You may feel more fullness at mealtimes and try to keep portions at meals smaller Some people may have nausea or even vomiting that may occur in the first few days; usually the symptoms go away with time. Please call if nausea or vomiting does not improve within 2 weeks  Lisinopril 1/2 tab    03/14/2022, 8:54 PM

## 2022-03-30 ENCOUNTER — Other Ambulatory Visit: Payer: Self-pay | Admitting: Legal Medicine

## 2022-03-30 DIAGNOSIS — E782 Mixed hyperlipidemia: Secondary | ICD-10-CM

## 2022-04-12 ENCOUNTER — Ambulatory Visit (INDEPENDENT_AMBULATORY_CARE_PROVIDER_SITE_OTHER): Payer: 59

## 2022-04-12 DIAGNOSIS — E291 Testicular hypofunction: Secondary | ICD-10-CM

## 2022-04-12 NOTE — Progress Notes (Signed)
   Testosterone injection given per order, patient tolerated well.   [name], LPN 4:54 AM

## 2022-04-13 ENCOUNTER — Other Ambulatory Visit: Payer: Self-pay | Admitting: Legal Medicine

## 2022-04-13 DIAGNOSIS — E291 Testicular hypofunction: Secondary | ICD-10-CM

## 2022-04-16 ENCOUNTER — Other Ambulatory Visit: Payer: Self-pay | Admitting: Legal Medicine

## 2022-04-16 ENCOUNTER — Other Ambulatory Visit: Payer: Self-pay

## 2022-04-16 MED ORDER — RYBELSUS 7 MG PO TABS: 7.0000 mg | ORAL_TABLET | Freq: Every day | ORAL | 2 refills | Status: DC

## 2022-04-20 ENCOUNTER — Other Ambulatory Visit: Payer: Self-pay

## 2022-04-20 DIAGNOSIS — E1165 Type 2 diabetes mellitus with hyperglycemia: Secondary | ICD-10-CM

## 2022-04-20 MED ORDER — RYBELSUS 7 MG PO TABS: 7.0000 mg | ORAL_TABLET | Freq: Every day | ORAL | 2 refills | Status: DC

## 2022-05-01 ENCOUNTER — Ambulatory Visit: Payer: 59 | Admitting: Legal Medicine

## 2022-05-03 ENCOUNTER — Encounter: Payer: Self-pay | Admitting: Physician Assistant

## 2022-05-03 ENCOUNTER — Ambulatory Visit: Payer: 59 | Admitting: Physician Assistant

## 2022-05-03 VITALS — BP 100/60 | HR 78 | Temp 97.7°F | Resp 15 | Ht 71.0 in | Wt 290.0 lb

## 2022-05-03 DIAGNOSIS — E782 Mixed hyperlipidemia: Secondary | ICD-10-CM

## 2022-05-03 DIAGNOSIS — L400 Psoriasis vulgaris: Secondary | ICD-10-CM

## 2022-05-03 DIAGNOSIS — E1122 Type 2 diabetes mellitus with diabetic chronic kidney disease: Secondary | ICD-10-CM

## 2022-05-03 DIAGNOSIS — M4300 Spondylolysis, site unspecified: Secondary | ICD-10-CM

## 2022-05-03 DIAGNOSIS — N1831 Chronic kidney disease, stage 3a: Secondary | ICD-10-CM

## 2022-05-03 DIAGNOSIS — I10 Essential (primary) hypertension: Secondary | ICD-10-CM | POA: Diagnosis not present

## 2022-05-03 NOTE — Progress Notes (Signed)
Subjective:  Patient ID: Brent Duarte, male    DOB: [birth year]  Age: 66 y.o. MRN: [id number]  Chief Complaint  Patient presents with   Diabetes   Hyperlipidemia   Hypertension    HPI  Pt with history of diabetes.  He currently follows with Dr Dwyane Dee (endocrinologist) and last appt was 03/14/22 - states his glucose had been doing well however he was advised to stop actos and also decrease prandin 2mg  to once daily with meals (he was taking tid regardless of meals)  He was started on rybelsus 3mg  and then was to increase to 7mg  qd however insurance would not cover medication and patient has tried to notify their office to do prior authorization but has not been able to get an answer to that issue.  He does have a follow up with them in 1-2 weeks and will address.  In the meantime without rybelsus his glucose is averaging over 200.  I have samples of 3mg  and will give pt so at least he is taking that dose until the issue with prior authorization for the higher dose is done with endocrinology. He is also on Jardiance 25mg  and metformin Pt is up to date on eye exam.  He is on statin therapy and also ACE for renal coverage.  Pt with history of DJD of spine.  He is following with Dr Nelva Bush.  His current medications include robaxin qd, percocet as needed and gabapentin.  He states at this time he is not a candidate for surgery.  His last appointment was in June.  Pt with history of hypertension - he had been on lisinopril 20mg  qd however when he was seen by specialist it was noted that his potassium was elevated and he was advised to decrease lisinopril to 1/2 tablet daily.  His bp today is stable.  Denies chest pain, edema, dyspnea  Pt is currently following with [facility].  He has a known elevated PSA and recent biopsy was proven benign.  His last appt was 5/23 and will be seeing them every 6 months.  They will continue to monitor PSA levels.  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  Compliance with treatment has been good The patient is compliant with medications, maintains a low cholesterol diet , follows up as directed , and maintains an exercise regimen . The patient denies experiencing any hypercholesterolemia related symptoms. He is currently on lovaza, welchol, and lipitor  Pt with history of psoriasis vulgaris.  Currently on [place] treatment and followed by dermatologist Dr Ronnald Ramp  Current Outpatient Medications on File Prior to Visit  Medication Sig Dispense Refill   acetaminophen (TYLENOL) 500 MG tablet Tylenol Extra Strength     allopurinol (ZYLOPRIM) 300 MG tablet Take 1 tablet (300 mg total) by mouth daily. 90 tablet 1   aspirin EC 81 MG tablet Take 81 mg by mouth daily. Swallow whole.     atorvastatin (LIPITOR) 40 MG tablet TAKE 1 TABLET BY MOUTH EVERY DAY 90 tablet 2   B-D 3CC LUER-LOK SYR 25GX1" 25G X 1" 3 ML MISC USE 1 ML BY DOES NOT APPLY ROUTE EVERY 14 (FOURTEEN) DAYS. ( 1 AND 1/2 [facility] ON BACKORDER)     colesevelam (WELCHOL) 625 MG tablet TAKE  3 TABLETS (1,875 MG TOTAL) BY MOUTH DAILY. 270 tablet 3   dupilumab (DUPIXENT) 300 MG/2ML prefilled syringe Inject 300 mg into the skin every 14 (fourteen) days.     empagliflozin (JARDIANCE) 25 MG TABS tablet TAKE 1 TABLET BY MOUTH EVERY DAY BEFORE BREAKFAST 90 tablet 3   gabapentin (NEURONTIN) 300 MG capsule Take 300 mg by mouth at bedtime.     glucose blood (ONETOUCH VERIO) test strip 1 each by Other route daily. Use as instructed 100 each 3   lisinopril (ZESTRIL) 20 MG tablet TAKE 1 TABLET BY MOUTH EVERY DAY AS DIRECTED (Patient taking differently: Take 10 mg by mouth daily.) 90 tablet 2   metFORMIN (GLUCOPHAGE-XR) 500 MG 24 hr tablet TAKE 2 TABLETS BY  MOUTH EVERY DAY WITH BREAKFAST 180 tablet 1   methocarbamol (ROBAXIN) 500 MG tablet Take 500 mg by mouth at bedtime.     omega-3 acid ethyl esters (LOVAZA) 1 g capsule TAKE 2 CAPSULES BY MOUTH TWICE A DAY 360 capsule 2   oxyCODONE-acetaminophen (PERCOCET) 10-325 MG tablet Take 1 tablet by mouth every 4 (four) hours as needed for pain. 60 tablet 0   repaglinide (PRANDIN) 2 MG tablet Take 1 tablet (2 mg total) by mouth 3 (three) times daily before meals. (Patient taking differently: Take 2 mg by mouth daily.) 270 tablet 3   Semaglutide (RYBELSUS) 3 MG TABS Take 3 mg by mouth daily. 30 tablet 0   testosterone cypionate (DEPOTESTOSTERONE CYPIONATE) 200 MG/ML injection INJECT 1ML INTRAMUSCULARLY ONCE A MONTH 3 mL 1   No current facility-administered medications on file prior to visit.   Past Medical History:  Diagnosis Date   Arthritis    BPH without obstruction/lower urinary tract symptoms 02/01/2020   History of chicken pox    History of kidney stones    History of measles    History of mumps    Hyperlipidemia    Idiopathic gout of right knee 02/01/2020   Imbalance    secondary to back issues   Multiple gastric ulcers    Numbness and tingling of both legs    Pneumonia    Psoriasis vulgaris 02/01/2020   Recurrent falls    Past Surgical History:  Procedure Laterality Date   bicep tear     right lower arm related to overgrowth of bone as stated per pt    KNEE ARTHROSCOPY     bilat   lithrotripsy     for kidney stones   LUMBAR LAMINECTOMY/DECOMPRESSION MICRODISCECTOMY N/A 09/07/2015   Procedure: MICRO LUMBAR DECOMPRESSION L4 - L5, REMOVAL OF SYNOVIAL CYST L4-L5;  Surgeon: Susa Day, MD;  Location: [facility];  Service: Orthopedics;  Laterality: N/A;   URETERAL STENT PLACEMENT     secondary to kidney stones/pt states had laser surgery prior     Family History  Problem Relation Age of Onset   Lung cancer Mother    Lung cancer Father    Diabetes Neg Hx    Social History    Socioeconomic History   Marital status: Married    Spouse name: Not on file   Number of children: Not on file   Years of education: Not on file   Highest education level: Not on file  Occupational History    Employer: [employer]   Tobacco Use   Smoking status: Never   Smokeless tobacco: Never  Substance and Sexual Activity   Alcohol use: No   Drug use: No   Sexual activity: Yes    Partners:  Female  Other Topics Concern   Not on file  Social History Narrative   Not on file   Social Determinants of Health   Financial Resource Strain: Not on file  Food Insecurity: Not on file  Transportation Needs: Not on file  Physical Activity: Not on file  Stress: Not on file  Social Connections: Not on file    Review of Systems CONSTITUTIONAL: Negative for chills, fatigue, fever, unintentional weight gain and unintentional weight loss.  E/N/T: Negative for ear pain, nasal congestion and sore throat.  CARDIOVASCULAR: Negative for chest pain, dizziness, palpitations and pedal edema.  RESPIRATORY: Negative for recent cough and dyspnea.  GASTROINTESTINAL: Negative for abdominal pain, acid reflux symptoms, constipation, diarrhea, nausea and vomiting.  MSK: Negative for arthralgias and myalgias.  INTEGUMENTARY: Negative for rash.  NEUROLOGICAL: Negative for dizziness and headaches.  PSYCHIATRIC: Negative for sleep disturbance and to question depression screen.  Negative for depression, negative for anhedonia.       Objective:  PHYSICAL EXAM:   VS: BP 100/60   Pulse 78   Temp 97.7 F (36.5 C)   Resp 15   Ht 5\' 11"  (1.803 m)   Wt 290 lb (131.5 kg)   SpO2 98%   BMI 40.45 kg/m   GEN: Well nourished, well developed, in no acute distress   Cardiac: RRR; no murmurs, rubs, or gallops,no edema -  Respiratory:  normal respiratory rate and pattern with no distress - normal breath sounds with no rales, rhonchi, wheezes or rubs  MS: no deformity or atrophy  Skin: warm and dry,  no rash   Psych: euthymic mood, appropriate affect and demeanor   Lab Results  Component Value Date   WBC 5.8 12/19/2021   HGB 16.0 12/19/2021   HCT 48.3 12/19/2021   PLT 146 (L) 12/19/2021   GLUCOSE 121 (H) 03/09/2022   CHOL 147 12/19/2021   TRIG 317 (H) 12/19/2021   HDL 32 (L) 12/19/2021   LDLCALC 65 12/19/2021   ALT 23 12/19/2021   AST 15 12/19/2021   NA 139 03/09/2022   K 5.4 No hemolysis seen (H) 03/09/2022   CL 105 03/09/2022   CREATININE 1.51 (H) 03/09/2022   BUN 42 (H) 03/09/2022   CO2 27 03/09/2022   TSH 1.760 07/27/2020   HGBA1C 6.2 03/09/2022      Assessment & Plan:   Problem List Items Addressed This Visit       Cardiovascular and Mediastinum   Essential hypertension   Relevant Medications   aspirin EC 81 MG tablet   Other Relevant Orders   CBC with Differential/Platelet   Comprehensive metabolic panel   TSH Continue current meds --- dose of lisinopril at 10mg  qd now     Endocrine   Type 2 diabetes mellitus with stage 3a chronic kidney disease, without long-term current use of insulin (HCC) - Primary   Relevant Medications   aspirin EC 81 MG tablet   Other Relevant Orders   CBC with Differential/Platelet   Comprehensive metabolic panel   TSH Continue meds and follow up with endocrinology as directed     Musculoskeletal and Integument   Psoriasis vulgaris Continue Dupixent   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  No orders of the defined types were placed in this encounter.  Orders Placed This Encounter  Procedures   CBC with Differential/Platelet   Comprehensive metabolic panel   TSH   Lipid panel     Follow-up: Return in about 3 months (around 08/03/2022) for chronic fasting follow up.  An After Visit Summary was printed and given to the patient.  Jettie Pagan [facility] [phone]

## 2022-05-04 LAB — COMPREHENSIVE METABOLIC PANEL
ALT: 21 IU/L (ref 0–44)
AST: 12 IU/L (ref 0–40)
Albumin/Globulin Ratio: 2.5 — ABNORMAL HIGH (ref 1.2–2.2)
Albumin: 4.7 g/dL (ref 3.9–4.9)
Alkaline Phosphatase: 99 IU/L (ref 44–121)
BUN/Creatinine Ratio: 26 — ABNORMAL HIGH (ref 10–24)
BUN: 39 mg/dL — ABNORMAL HIGH (ref 8–27)
Bilirubin Total: 0.8 mg/dL (ref 0.0–1.2)
CO2: 19 mmol/L — ABNORMAL LOW (ref 20–29)
Calcium: 9.6 mg/dL (ref 8.6–10.2)
Chloride: 101 mmol/L (ref 96–106)
Creatinine, Ser: 1.52 mg/dL — ABNORMAL HIGH (ref 0.76–1.27)
Globulin, Total: 1.9 g/dL (ref 1.5–4.5)
Glucose: 168 mg/dL — ABNORMAL HIGH (ref 70–99)
Potassium: 5.2 mmol/L (ref 3.5–5.2)
Sodium: 136 mmol/L (ref 134–144)
Total Protein: 6.6 g/dL (ref 6.0–8.5)
eGFR: 50 mL/min/{1.73_m2} — ABNORMAL LOW (ref >59)

## 2022-05-04 LAB — CBC WITH DIFFERENTIAL/PLATELET
Basophils Absolute: 0 10*3/uL (ref 0.0–0.2)
Basos: 0 %
EOS (ABSOLUTE): 0.1 10*3/uL (ref 0.0–0.4)
Eos: 2 %
Hematocrit: 48 % (ref 37.5–51.0)
Hemoglobin: 16.3 g/dL (ref 13.0–17.7)
Immature Grans (Abs): 0 10*3/uL (ref 0.0–0.1)
Immature Granulocytes: 1 %
Lymphocytes Absolute: 1.1 10*3/uL (ref 0.7–3.1)
Lymphs: 20 %
MCH: 32 pg (ref 26.6–33.0)
MCHC: 34 g/dL (ref 31.5–35.7)
MCV: 94 fL (ref 79–97)
Monocytes Absolute: 0.4 10*3/uL (ref 0.1–0.9)
Monocytes: 8 %
Neutrophils Absolute: 3.8 10*3/uL (ref 1.4–7.0)
Neutrophils: 69 %
Platelets: 132 10*3/uL — ABNORMAL LOW (ref 150–450)
RBC: 5.09 x10E6/uL (ref 4.14–5.80)
RDW: 15.2 % (ref 11.6–15.4)
WBC: 5.5 10*3/uL (ref 3.4–10.8)

## 2022-05-04 LAB — LIPID PANEL
Chol/HDL Ratio: 4.4 ratio (ref 0.0–5.0)
Cholesterol, Total: 137 mg/dL (ref 100–199)
HDL: 31 mg/dL — ABNORMAL LOW (ref >39)
LDL Chol Calc (NIH): 49 mg/dL (ref 0–99)
Triglycerides: 381 mg/dL — ABNORMAL HIGH (ref 0–149)
VLDL Cholesterol Cal: 57 mg/dL — ABNORMAL HIGH (ref 5–40)

## 2022-05-04 LAB — TSH: TSH: 1.38 u[IU]/mL (ref 0.450–4.500)

## 2022-05-04 LAB — CARDIOVASCULAR RISK ASSESSMENT

## 2022-05-05 ENCOUNTER — Other Ambulatory Visit: Payer: Self-pay | Admitting: Legal Medicine

## 2022-05-05 DIAGNOSIS — I10 Essential (primary) hypertension: Secondary | ICD-10-CM

## 2022-05-08 ENCOUNTER — Telehealth: Payer: Self-pay | Admitting: Pharmacy Technician

## 2022-05-08 ENCOUNTER — Other Ambulatory Visit (HOSPITAL_COMMUNITY): Payer: Self-pay

## 2022-05-08 ENCOUNTER — Other Ambulatory Visit: Payer: Self-pay

## 2022-05-08 DIAGNOSIS — E1165 Type 2 diabetes mellitus with hyperglycemia: Secondary | ICD-10-CM

## 2022-05-08 MED ORDER — METFORMIN HCL ER 500 MG PO TB24: ORAL_TABLET | ORAL | 1 refills | Status: DC

## 2022-05-08 NOTE — Telephone Encounter (Signed)
Patient Advocate Encounter   Received notification from CoverMyMeds that prior authorization for Rybelsus 7mg  is required/requested.   PA submitted on 05/08/22 to [facility] RX/RX [id number] via CoverMyMeds Key [id number] Status is pending  Pharmacy Patient Advocate Fax:  [phone]

## 2022-05-09 NOTE — Telephone Encounter (Signed)
Patient Advocate Encounter  Prior Authorization for Rybelsus 7MG  tablets has been approved.  Rx #: Effective: 05/08/2022 to 05/08/2023  05/10/2023, CPhT Pharmacy Patient Advocate [facility] Patient Advocate Team Phone: [phone]   Fax: [phone]

## 2022-05-14 ENCOUNTER — Ambulatory Visit (INDEPENDENT_AMBULATORY_CARE_PROVIDER_SITE_OTHER): Payer: 59

## 2022-05-14 DIAGNOSIS — E291 Testicular hypofunction: Secondary | ICD-10-CM | POA: Diagnosis not present

## 2022-05-14 MED ORDER — TESTOSTERONE CYPIONATE 200 MG/ML IM SOLN
200.0000 mg | Freq: Once | INTRAMUSCULAR | Status: AC
  Administered 2022-05-14: 200 mg via INTRAMUSCULAR

## 2022-05-14 NOTE — Progress Notes (Signed)
   Testosterone injection given per order, patient tolerated well.   [name], LPN 7:27 AM

## 2022-05-21 ENCOUNTER — Telehealth: Payer: Self-pay

## 2022-05-21 NOTE — Telephone Encounter (Signed)
Patient called to state that he stopped Rybelsus. It made him sick and no appetite and no energy.

## 2022-06-04 ENCOUNTER — Encounter: Payer: Self-pay | Admitting: Physician Assistant

## 2022-06-04 ENCOUNTER — Ambulatory Visit: Payer: 59 | Admitting: Physician Assistant

## 2022-06-04 VITALS — BP 108/60 | HR 95 | Temp 97.2°F | Ht 71.0 in | Wt 288.8 lb

## 2022-06-04 DIAGNOSIS — E1122 Type 2 diabetes mellitus with diabetic chronic kidney disease: Secondary | ICD-10-CM | POA: Diagnosis not present

## 2022-06-04 DIAGNOSIS — N1831 Chronic kidney disease, stage 3a: Secondary | ICD-10-CM | POA: Diagnosis not present

## 2022-06-04 NOTE — Progress Notes (Signed)
Subjective:  Patient ID: Brent Duarte, male    DOB: Jun 19, 1956  Age: 66 y.o. MRN: [id number]  Chief Complaint  Patient presents with   Medication Refill    HPI  Pt in today to discuss his diabetic medications.  He had seen Dr Lucianne Muss (endocrinologist) once after have seen Dr Everardo All for awhile.  At that visit he was advised to stop actos which he had been on longterm and decrease prandin to 1mg  only before meals.  He was started on rybelsus 3mg  qd.  Since that time he has not followed up with that office.  He got moderately ill / sick while taking rybelsus and stopped medication on his own.  He states that he would like to restart actos and stay off GLP1 therapy due to nausea and vomiting.  He would like our office to manage his diabetes instead of following with endocrinology.  His last hgb a1c in 5/23 was 6.2.   I will repeat labwork and see what kidney functions are and also hgb a1c before adjusting medication Pt has chronic fasting follow up in our office 10/23 currently for his chronic medical issues Pt states randomly glucose ranges over 200s but does not check fasting values regularly Current Outpatient Medications on File Prior to Visit  Medication Sig Dispense Refill   acetaminophen (TYLENOL) 500 MG tablet Tylenol Extra Strength     allopurinol (ZYLOPRIM) 300 MG tablet Take 1 tablet (300 mg total) by mouth daily. 90 tablet 1   aspirin EC 81 MG tablet Take 81 mg by mouth daily. Swallow whole.     atorvastatin (LIPITOR) 40 MG tablet TAKE 1 TABLET BY MOUTH EVERY DAY 90 tablet 2   B-D 3CC LUER-LOK SYR 25GX1" 25G X 1" 3 ML MISC USE 1 ML BY DOES NOT APPLY ROUTE EVERY 14 (FOURTEEN) DAYS. ( 1 AND 1/2 INCH ON BACKORDER)     colesevelam (WELCHOL) 625 MG tablet TAKE 3 TABLETS (1,875 MG TOTAL) BY MOUTH DAILY. 270 tablet 3   dupilumab (DUPIXENT) 300 MG/2ML prefilled syringe Inject 300 mg into the skin every 14 (fourteen) days.     empagliflozin (JARDIANCE) 25 MG TABS tablet TAKE 1 TABLET BY MOUTH  EVERY DAY BEFORE BREAKFAST 90 tablet 3   glucose blood (ONETOUCH VERIO) test strip 1 each by Other route daily. Use as instructed 100 each 3   lisinopril (ZESTRIL) 20 MG tablet TAKE 1 TABLET BY MOUTH EVERY DAY AS DIRECTED 90 tablet 2   metFORMIN (GLUCOPHAGE-XR) 500 MG 24 hr tablet TAKE 2 TABLETS BY MOUTH EVERY DAY WITH BREAKFAST 180 tablet 1   methocarbamol (ROBAXIN) 500 MG tablet Take 500 mg by mouth at bedtime.     omega-3 acid ethyl esters (LOVAZA) 1 g capsule TAKE 2 CAPSULES BY MOUTH TWICE A DAY 360 capsule 2   oxyCODONE-acetaminophen (PERCOCET) 10-325 MG tablet Take 1 tablet by mouth every 4 (four) hours as needed for pain. 60 tablet 0   repaglinide (PRANDIN) 2 MG tablet Take 1 tablet (2 mg total) by mouth 3 (three) times daily before meals. (Patient taking differently: Take 1 mg by mouth 3 (three) times daily before meals.) 270 tablet 3   testosterone cypionate (DEPOTESTOSTERONE CYPIONATE) 200 MG/ML injection INJECT 6/23 INTRAMUSCULARLY ONCE A MONTH 3 mL 1   No current facility-administered medications on file prior to visit.   Past Medical History:  Diagnosis Date   Arthritis    BPH without obstruction/lower urinary tract symptoms 02/01/2020   History of chicken pox  History of kidney stones    History of measles    History of mumps    Hyperlipidemia    Idiopathic gout of right knee 02/01/2020   Imbalance    secondary to back issues   Multiple gastric ulcers    Numbness and tingling of both legs    Pneumonia    Psoriasis vulgaris 02/01/2020   Recurrent falls    Past Surgical History:  Procedure Laterality Date   bicep tear     right lower arm related to overgrowth of bone as stated per pt    KNEE ARTHROSCOPY     bilat   lithrotripsy     for kidney stones   LUMBAR LAMINECTOMY/DECOMPRESSION MICRODISCECTOMY N/A 09/07/2015   Procedure: MICRO LUMBAR DECOMPRESSION L4 - L5, REMOVAL OF SYNOVIAL CYST L4-L5;  Surgeon: Jene Every, MD;  Location: [facility];  Service: Orthopedics;   Laterality: N/A;   URETERAL STENT PLACEMENT     secondary to kidney stones/pt states had laser surgery prior     Family History  Problem Relation Age of Onset   Lung cancer Mother    Lung cancer Father    Diabetes Neg Hx    Social History   Socioeconomic History   Marital status: Married    Spouse name: Not on file   Number of children: Not on file   Years of education: Not on file   Highest education level: Not on file  Occupational History    Employer: [employer]   Tobacco Use   Smoking status: Never   Smokeless tobacco: Never  Substance and Sexual Activity   Alcohol use: No   Drug use: No   Sexual activity: Yes    Partners: Female  Other Topics Concern   Not on file  Social History Narrative   Not on file   Social Determinants of Health   Financial Resource Strain: Not on file  Food Insecurity: Not on file  Transportation Needs: Not on file  Physical Activity: Not on file  Stress: Not on file  Social Connections: Not on file    Review of Systems CONSTITUTIONAL: Negative for chills, fatigue, fever,    CARDIOVASCULAR: Negative for chest pain, dizziness, palpitations and pedal edema.  RESPIRATORY: Negative for recent cough and dyspnea.  GASTROINTESTINAL: see HPI MSK: Negative for arthralgias and myalgias.  INTEGUMENTARY: Negative for rash.        Objective:  PHYSICAL EXAM:   VS: BP 108/60 (BP Location: Left Arm, Patient Position: Sitting, Cuff Size: Large)   Pulse 95   Temp (!) 97.2 F (36.2 C) (Temporal)   Ht 5\' 11"  (1.803 m)   Wt 288 lb 12.8 oz (131 kg)   SpO2 95%   BMI 40.28 kg/m   GEN: Well nourished, well developed, in no acute distress  Cardiac: RRR; no murmurs, rubs, or gallops,no edema - Respiratory:  normal respiratory rate and pattern with no distress - normal breath sounds with no rales, rhonchi, wheezes or rubs Skin: warm and dry, no rash  Psych: euthymic mood, appropriate affect and demeanor   Lab Results  Component Value  Date   WBC 5.5 05/03/2022   HGB 16.3 05/03/2022   HCT 48.0 05/03/2022   PLT 132 (L) 05/03/2022   GLUCOSE 168 (H) 05/03/2022   CHOL 137 05/03/2022   TRIG 381 (H) 05/03/2022   HDL 31 (L) 05/03/2022   LDLCALC 49 05/03/2022   ALT 21 05/03/2022   AST 12 05/03/2022   NA 136 05/03/2022  K 5.2 05/03/2022   CL 101 05/03/2022   CREATININE 1.52 (H) 05/03/2022   BUN 39 (H) 05/03/2022   CO2 19 (L) 05/03/2022   TSH 1.380 05/03/2022   HGBA1C 6.2 03/09/2022      Assessment & Plan:   Problem List Items Addressed This Visit       Endocrine   Type 2 diabetes mellitus with stage 3a chronic kidney disease, without long-term current use of insulin (HCC) - Primary   Relevant Orders   Comprehensive metabolic panel   Hemoglobin A1c Will await labwork and adjust medication accordingly  .  No orders of the defined types were placed in this encounter.   Orders Placed This Encounter  Procedures   Comprehensive metabolic panel   Hemoglobin A1c     Follow-up: Return for as scheduled for next chronic visit.  An After Visit Summary was printed and given to the patient.  Jettie Pagan [facility] [phone]

## 2022-06-05 ENCOUNTER — Other Ambulatory Visit: Payer: Self-pay | Admitting: Physician Assistant

## 2022-06-05 DIAGNOSIS — N1831 Chronic kidney disease, stage 3a: Secondary | ICD-10-CM

## 2022-06-05 LAB — COMPREHENSIVE METABOLIC PANEL
ALT: 20 IU/L (ref 0–44)
AST: 13 IU/L (ref 0–40)
Albumin/Globulin Ratio: 2.6 — ABNORMAL HIGH (ref 1.2–2.2)
Albumin: 4.6 g/dL (ref 3.9–4.9)
Alkaline Phosphatase: 113 IU/L (ref 44–121)
BUN/Creatinine Ratio: 27 — ABNORMAL HIGH (ref 10–24)
BUN: 37 mg/dL — ABNORMAL HIGH (ref 8–27)
Bilirubin Total: 0.7 mg/dL (ref 0.0–1.2)
CO2: 16 mmol/L — ABNORMAL LOW (ref 20–29)
Calcium: 10 mg/dL (ref 8.6–10.2)
Chloride: 102 mmol/L (ref 96–106)
Creatinine, Ser: 1.39 mg/dL — ABNORMAL HIGH (ref 0.76–1.27)
Globulin, Total: 1.8 g/dL (ref 1.5–4.5)
Glucose: 295 mg/dL — ABNORMAL HIGH (ref 70–99)
Potassium: 5.2 mmol/L (ref 3.5–5.2)
Sodium: 138 mmol/L (ref 134–144)
Total Protein: 6.4 g/dL (ref 6.0–8.5)
eGFR: 56 mL/min/{1.73_m2} — ABNORMAL LOW (ref >59)

## 2022-06-05 LAB — HEMOGLOBIN A1C
Est. average glucose Bld gHb Est-mCnc: 194 mg/dL
Hgb A1c MFr Bld: 8.4 % — ABNORMAL HIGH (ref 4.8–5.6)

## 2022-06-05 MED ORDER — SITAGLIPTIN PHOSPHATE 25 MG PO TABS: 25.0000 mg | ORAL_TABLET | Freq: Every day | ORAL | 2 refills | Status: DC

## 2022-06-08 ENCOUNTER — Other Ambulatory Visit: Payer: 59

## 2022-06-12 ENCOUNTER — Ambulatory Visit: Payer: 59 | Admitting: Endocrinology

## 2022-06-14 ENCOUNTER — Telehealth: Payer: Self-pay | Admitting: Pharmacy Technician

## 2022-06-14 NOTE — Telephone Encounter (Signed)
Received fax PA request from [employer] for Rybelsus 7mg .  Didn't see med on pt's list. After reviewing notes, pt has taken himself off this med due to side effects and is following his other physician, no longer our office. Cancelled PA for now.

## 2022-06-15 ENCOUNTER — Ambulatory Visit (INDEPENDENT_AMBULATORY_CARE_PROVIDER_SITE_OTHER): Payer: 59

## 2022-06-15 DIAGNOSIS — E291 Testicular hypofunction: Secondary | ICD-10-CM | POA: Diagnosis not present

## 2022-06-15 MED ORDER — TESTOSTERONE CYPIONATE 200 MG/ML IM SOLN
200.0000 mg | Freq: Once | INTRAMUSCULAR | Status: AC
  Administered 2022-06-15: 200 mg via INTRAMUSCULAR

## 2022-06-15 NOTE — Progress Notes (Signed)
Patient came in for nurse visit for testosterone injection. Patient tolerated injection fine.

## 2022-07-13 ENCOUNTER — Ambulatory Visit (INDEPENDENT_AMBULATORY_CARE_PROVIDER_SITE_OTHER): Payer: 59

## 2022-07-13 DIAGNOSIS — E291 Testicular hypofunction: Secondary | ICD-10-CM

## 2022-07-13 MED ORDER — TESTOSTERONE CYPIONATE 200 MG/ML IM SOLN
200.0000 mg | INTRAMUSCULAR | Status: DC
  Administered 2022-07-13 – 2022-09-17 (×3): 200 mg via INTRAMUSCULAR

## 2022-07-25 ENCOUNTER — Other Ambulatory Visit: Payer: Self-pay | Admitting: Physician Assistant

## 2022-08-03 ENCOUNTER — Encounter: Payer: Self-pay | Admitting: Physician Assistant

## 2022-08-03 ENCOUNTER — Ambulatory Visit: Payer: 59 | Admitting: Physician Assistant

## 2022-08-03 VITALS — BP 100/62 | HR 74 | Temp 97.1°F | Ht 71.0 in | Wt 276.2 lb

## 2022-08-03 DIAGNOSIS — E782 Mixed hyperlipidemia: Secondary | ICD-10-CM

## 2022-08-03 DIAGNOSIS — R0609 Other forms of dyspnea: Secondary | ICD-10-CM

## 2022-08-03 DIAGNOSIS — I10 Essential (primary) hypertension: Secondary | ICD-10-CM | POA: Diagnosis not present

## 2022-08-03 DIAGNOSIS — Z23 Encounter for immunization: Secondary | ICD-10-CM | POA: Insufficient documentation

## 2022-08-03 DIAGNOSIS — E1122 Type 2 diabetes mellitus with diabetic chronic kidney disease: Secondary | ICD-10-CM

## 2022-08-03 DIAGNOSIS — N1831 Chronic kidney disease, stage 3a: Secondary | ICD-10-CM

## 2022-08-03 MED ORDER — SITAGLIPTIN PHOSPHATE 50 MG PO TABS: 50.0000 mg | ORAL_TABLET | Freq: Every day | ORAL | 2 refills | Status: DC

## 2022-08-03 NOTE — Progress Notes (Signed)
Subjective:  Patient ID: Brent Duarte, male    DOB: Mar 24, 1956  Age: 66 y.o. 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  He used to see Dr Loanne Drilling (endocrinology) but since he retired pt does not want to follow up with that office.  At last visit it was noted that his hgb a1c had elevated to 8.4.  Med adjustments were made and he is currently on glucophage, Jardiance 25mg  and Januvia 25mg .  He states that he go sick on the first several weeks of Januvia but now is feeling better.  However his glucose readings have been averaging in mid 200s/mid 300s.   Recommend at this time will increase Januvia to 50mg  qd and will have pt back in 8 weeks (which at that time can repeat hgba a1c since not due for today)  Pt with history of hyperlipidemia.  He is currently on lovaza , welchol, and lipitor.  Is due for fasting labwork today  Pt with history of eczematous dermatitis and is receiving Dupixent injections every other week  Pt is currently followed by [facility] for elevated PSA and testosterone deficiency.  He had appt  with them recently and had biopsy few months ago  Pt with history of chronic back pain for degenerative lumbar stenosis- currently uses robaxin and percocet as needed and follows with Dr Nelva Bush  Pt states that over the past few months he has had 2 episodes of standing up quickly and feeling dizziness. However he has noted that he is getting more short of breath with activity than he used to have and occasionally has some light headedness and mild dizziness with those symptoms as well. He denies chest pain.  Pt states that he has had cardiology consult with Dr Gwenlyn Found in the past but did not have any type of testing done (CTA, stress test, echo, etc)  Has not seen cardiology in awhile  Pt would like to get flu shot and COVID vaccine today Current Outpatient  Medications on File Prior to Visit  Medication Sig Dispense Refill   acetaminophen (TYLENOL) 500 MG tablet Tylenol Extra Strength     allopurinol (ZYLOPRIM) 300 MG tablet Take 1 tablet (300 mg total) by mouth daily. 90 tablet 1   aspirin EC 81 MG tablet Take 81 mg by mouth daily. Swallow whole.     atorvastatin (LIPITOR) 40 MG tablet TAKE 1 TABLET BY MOUTH EVERY DAY 90 tablet 2   B-D 3CC LUER-LOK SYR 25GX1" 25G X 1" 3 ML MISC USE 1 ML BY DOES NOT APPLY ROUTE EVERY 14 (FOURTEEN) DAYS. ( 1 AND 1/2 INCH ON BACKORDER)     colesevelam (WELCHOL) 625 MG tablet TAKE 3 TABLETS (1,875 MG TOTAL) BY MOUTH DAILY. 270 tablet 0   dupilumab (DUPIXENT) 300 MG/2ML prefilled syringe Inject 300 mg into the skin every 14 (fourteen) days.     empagliflozin (JARDIANCE) 25 MG TABS tablet TAKE 1 TABLET BY MOUTH EVERY DAY BEFORE BREAKFAST 90 tablet 3   glucose blood (ONETOUCH VERIO) test strip 1 each by Other route daily. Use as instructed 100 each 3   lisinopril (ZESTRIL) 20 MG tablet TAKE 1 TABLET BY MOUTH EVERY DAY AS DIRECTED 90 tablet 2   metFORMIN (GLUCOPHAGE-XR) 500 MG 24 hr tablet TAKE 2 TABLETS BY MOUTH EVERY DAY WITH BREAKFAST 180 tablet 1   methocarbamol (  ROBAXIN) 500 MG tablet Take 500 mg by mouth at bedtime.     omega-3 acid ethyl esters (LOVAZA) 1 g capsule TAKE 2 CAPSULES BY MOUTH TWICE A DAY 360 capsule 2   oxyCODONE-acetaminophen (PERCOCET) 10-325 MG tablet Take 1 tablet by mouth every 4 (four) hours as needed for pain. 60 tablet 0   testosterone cypionate (DEPOTESTOSTERONE CYPIONATE) 200 MG/ML injection INJECT 1ML INTRAMUSCULARLY ONCE A MONTH 3 mL 1   Current Facility-Administered Medications on File Prior to Visit  Medication Dose Route Frequency Provider Last Rate Last Admin   testosterone cypionate (DEPOTESTOSTERONE CYPIONATE) injection 200 mg  200 mg Intramuscular Q30 days [facility], Elnita Maxwell, MD   200 mg at 07/13/22 1039   Past Medical History:  Diagnosis Date   Arthritis    BPH without  obstruction/lower urinary tract symptoms 02/01/2020   History of chicken pox    History of kidney stones    History of measles    History of mumps    Hyperlipidemia    Idiopathic gout of right knee 02/01/2020   Imbalance    secondary to back issues   Multiple gastric ulcers    Numbness and tingling of both legs    Pneumonia    Psoriasis vulgaris 02/01/2020   Recurrent falls    Past Surgical History:  Procedure Laterality Date   bicep tear     right lower arm related to overgrowth of bone as stated per pt    KNEE ARTHROSCOPY     bilat   lithrotripsy     for kidney stones   LUMBAR LAMINECTOMY/DECOMPRESSION MICRODISCECTOMY N/A 09/07/2015   Procedure: MICRO LUMBAR DECOMPRESSION L4 - L5, REMOVAL OF SYNOVIAL CYST L4-L5;  Surgeon: Susa Day, MD;  Location: [facility];  Service: Orthopedics;  Laterality: N/A;   URETERAL STENT PLACEMENT     secondary to kidney stones/pt states had laser surgery prior     Family History  Problem Relation Age of Onset   Lung cancer Mother    Lung cancer Father    Diabetes Neg Hx    Social History   Socioeconomic History   Marital status: Married    Spouse name: Not on file   Number of children: Not on file   Years of education: Not on file   Highest education level: Not on file  Occupational History    Employer: [employer]   Tobacco Use   Smoking status: Never   Smokeless tobacco: Never  Substance and Sexual Activity   Alcohol use: No   Drug use: No   Sexual activity: Yes    Partners: Female  Other Topics Concern   Not on file  Social History Narrative   Not on file   Social Determinants of Health   Financial Resource Strain: Not on file  Food Insecurity: Not on file  Transportation Needs: Not on file  Physical Activity: Not on file  Stress: Not on file  Social Connections: Not on file    Review of Systems CONSTITUTIONAL: Negative for chills, fatigue, fever, unintentional weight gain and unintentional weight loss.  E/N/T:  Negative for ear pain, nasal congestion and sore throat.  CARDIOVASCULAR: see HPI GASTROINTESTINAL: see HPI  MSK: see HPI INTEGUMENTARY: Negative for rash.  NEUROLOGICAL: see HPI PSYCHIATRIC: Negative for sleep disturbance and to question depression screen.  Negative for depression, negative for anhedonia.       Objective:  PHYSICAL EXAM:   VS: BP 100/62 (BP Location: Left Arm, Patient Position: Sitting, Cuff Size: Large)  Pulse 74   Temp (!) 97.1 F (36.2 C) (Temporal)   Ht 5\' 11"  (1.803 m)   Wt 276 lb 3.2 oz (125.3 kg)   SpO2 97%   BMI 38.52 kg/m   GEN: Well nourished, well developed, in no acute distress  Cardiac: RRR; no murmurs, rubs, or gallops,no edema - no significant varicosities Respiratory:  normal respiratory rate and pattern with no distress - normal breath sounds with no rales, rhonchi, wheezes or rubs MS: no deformity or atrophy  Skin: warm and dry, no rash  Neuro:  Alert and Oriented x 3,- CN II-Xii grossly intact Psych: euthymic mood, appropriate affect and demeanor  EKG normal   Lab Results  Component Value Date   WBC 5.5 05/03/2022   HGB 16.3 05/03/2022   HCT 48.0 05/03/2022   PLT 132 (L) 05/03/2022   GLUCOSE 295 (H) 06/04/2022   CHOL 137 05/03/2022   TRIG 381 (H) 05/03/2022   HDL 31 (L) 05/03/2022   LDLCALC 49 05/03/2022   ALT 20 06/04/2022   AST 13 06/04/2022   NA 138 06/04/2022   K 5.2 06/04/2022   CL 102 06/04/2022   CREATININE 1.39 (H) 06/04/2022   BUN 37 (H) 06/04/2022   CO2 16 (L) 06/04/2022   TSH 1.380 05/03/2022   HGBA1C 8.4 (H) 06/04/2022      Assessment & Plan:   Problem List Items Addressed This Visit       Cardiovascular and Mediastinum   Essential hypertension   Relevant Orders   CBC with Differential/Platelet   Comprehensive metabolic panel   TSH Continue current meds     Endocrine   Type 2 diabetes mellitus with stage 3a chronic kidney disease, without long-term current use of insulin (HCC)   Relevant  Medications   sitaGLIPtin (JANUVIA) 50 MG tablet Increase januvia to 50mg  qd and continue other meds as directed   Other Relevant Orders   Comprehensive metabolic panel     Other   Mixed hyperlipidemia   Relevant Orders   Lipid panel Continue meds Watch diet   Need for COVID-19 vaccine   Relevant Orders   Pfizer Fall 2023 Covid-19 Vaccine 45yrs and older (Completed)   Needs flu shot   Relevant Orders   Flu Vaccine QUAD High Dose(Fluad) (Completed)   Other Visit Diagnoses     Exertional dyspnea    -  Primary   Relevant Orders   EKG 12-Lead (Completed) Recommend cardiology consult - pt states he will contact Dr Rachel Bo office himself     .  Meds ordered this encounter  Medications   sitaGLIPtin (JANUVIA) 50 MG tablet    Sig: Take 1 tablet (50 mg total) by mouth daily.    Dispense:  30 tablet    Refill:  2    Order Specific Question:   Supervising Provider    AnswerShelton Silvas    Orders Placed This Encounter  Procedures   Flu Vaccine QUAD High Dose(Fluad)   Pfizer Fall 2023 Covid-19 Vaccine 9yrs and older   CBC with Differential/Platelet   Comprehensive metabolic panel   TSH   Lipid panel   EKG 12-Lead     Follow-up: Return in about 2 months (around 10/03/2022) for follow up- fasting.  An After Visit Summary was printed and given to the patient.  Yetta Flock [facility] [phone]

## 2022-08-04 LAB — CBC WITH DIFFERENTIAL/PLATELET
Basophils Absolute: 0 10*3/uL (ref 0.0–0.2)
Basos: 0 %
EOS (ABSOLUTE): 0.1 10*3/uL (ref 0.0–0.4)
Eos: 2 %
Hematocrit: 44.9 % (ref 37.5–51.0)
Hemoglobin: 15.2 g/dL (ref 13.0–17.7)
Immature Grans (Abs): 0.1 10*3/uL (ref 0.0–0.1)
Immature Granulocytes: 1 %
Lymphocytes Absolute: 1.1 10*3/uL (ref 0.7–3.1)
Lymphs: 17 %
MCH: 32.3 pg (ref 26.6–33.0)
MCHC: 33.9 g/dL (ref 31.5–35.7)
MCV: 95 fL (ref 79–97)
Monocytes Absolute: 0.5 10*3/uL (ref 0.1–0.9)
Monocytes: 7 %
Neutrophils Absolute: 4.8 10*3/uL (ref 1.4–7.0)
Neutrophils: 73 %
Platelets: 125 10*3/uL — ABNORMAL LOW (ref 150–450)
RBC: 4.71 x10E6/uL (ref 4.14–5.80)
RDW: 15 % (ref 11.6–15.4)
WBC: 6.5 10*3/uL (ref 3.4–10.8)

## 2022-08-04 LAB — LIPID PANEL
Chol/HDL Ratio: 3.7 ratio (ref 0.0–5.0)
Cholesterol, Total: 107 mg/dL (ref 100–199)
HDL: 29 mg/dL — ABNORMAL LOW (ref >39)
LDL Chol Calc (NIH): 38 mg/dL (ref 0–99)
Triglycerides: 259 mg/dL — ABNORMAL HIGH (ref 0–149)
VLDL Cholesterol Cal: 40 mg/dL (ref 5–40)

## 2022-08-04 LAB — COMPREHENSIVE METABOLIC PANEL
ALT: 14 IU/L (ref 0–44)
AST: 12 IU/L (ref 0–40)
Albumin/Globulin Ratio: 2.4 — ABNORMAL HIGH (ref 1.2–2.2)
Albumin: 4.6 g/dL (ref 3.9–4.9)
Alkaline Phosphatase: 98 IU/L (ref 44–121)
BUN/Creatinine Ratio: 18 (ref 10–24)
BUN: 29 mg/dL — ABNORMAL HIGH (ref 8–27)
Bilirubin Total: 1 mg/dL (ref 0.0–1.2)
CO2: 19 mmol/L — ABNORMAL LOW (ref 20–29)
Calcium: 9 mg/dL (ref 8.6–10.2)
Chloride: 100 mmol/L (ref 96–106)
Creatinine, Ser: 1.64 mg/dL — ABNORMAL HIGH (ref 0.76–1.27)
Globulin, Total: 1.9 g/dL (ref 1.5–4.5)
Glucose: 214 mg/dL — ABNORMAL HIGH (ref 70–99)
Potassium: 4.5 mmol/L (ref 3.5–5.2)
Sodium: 137 mmol/L (ref 134–144)
Total Protein: 6.5 g/dL (ref 6.0–8.5)
eGFR: 46 mL/min/{1.73_m2} — ABNORMAL LOW (ref >59)

## 2022-08-04 LAB — CARDIOVASCULAR RISK ASSESSMENT

## 2022-08-04 LAB — TSH: TSH: 1.84 u[IU]/mL (ref 0.450–4.500)

## 2022-08-13 ENCOUNTER — Ambulatory Visit (INDEPENDENT_AMBULATORY_CARE_PROVIDER_SITE_OTHER): Payer: 59

## 2022-08-13 ENCOUNTER — Other Ambulatory Visit: Payer: Self-pay

## 2022-08-13 DIAGNOSIS — E1122 Type 2 diabetes mellitus with diabetic chronic kidney disease: Secondary | ICD-10-CM

## 2022-08-13 DIAGNOSIS — E291 Testicular hypofunction: Secondary | ICD-10-CM | POA: Diagnosis not present

## 2022-08-13 NOTE — Progress Notes (Signed)
   Testosterone injection given per order, patient tolerated well.   Erie Noe, LPN 3:21 AM

## 2022-08-20 ENCOUNTER — Telehealth: Payer: Self-pay

## 2022-08-20 NOTE — Telephone Encounter (Signed)
Patient called stated that he hasn't heard anything about his referral to [facility].   Notified patient that referral has been sent and he can called [facility] and see about making an appointment.

## 2022-08-22 ENCOUNTER — Ambulatory Visit: Payer: 59 | Attending: Cardiovascular Disease | Admitting: Cardiovascular Disease

## 2022-08-22 ENCOUNTER — Encounter: Payer: Self-pay | Admitting: Cardiovascular Disease

## 2022-08-22 VITALS — BP 101/63 | HR 96 | Ht 71.0 in | Wt 277.4 lb

## 2022-08-22 DIAGNOSIS — I10 Essential (primary) hypertension: Secondary | ICD-10-CM

## 2022-08-22 DIAGNOSIS — E782 Mixed hyperlipidemia: Secondary | ICD-10-CM | POA: Diagnosis not present

## 2022-08-22 NOTE — Assessment & Plan Note (Signed)
History of hyperlipidemia on atorvastatin and Lovaza with lipid profile performed 08/03/2022 revealing total cholesterol 107, LDL 38 and HDL 29.

## 2022-08-22 NOTE — Patient Instructions (Signed)
Medication Instructions:  The current medical regimen is effective;  continue present plan and medications.  *If you need a refill on your cardiac medications before your next appointment, please call your pharmacy*  Testing/Procedures: CALCIUM SCORE   Follow-Up: At [facility], you and your health needs are our priority.  As part of our continuing mission to provide you with exceptional heart care, we have created designated Provider Care Teams.  These Care Teams include your primary Cardiologist (physician) and Advanced Practice Providers (APPs -  Physician Assistants and Nurse Practitioners) who all work together to provide you with the care you need, when you need it.  We recommend signing up for the patient portal called "MyChart".  Sign up information is provided on this After Visit Summary.  MyChart is used to connect with patients for Virtual Visits (Telemedicine).  Patients are able to view lab/test results, encounter notes, upcoming appointments, etc.  Non-urgent messages can be sent to your provider as well.   To learn more about what you can do with MyChart, go to [email or web address].    Your next appointment:   As needed  The format for your next appointment:   In Person  Provider:   Nanetta Batty, MD

## 2022-08-22 NOTE — Progress Notes (Signed)
08/22/2022 Brent Duarte   [birth year]  [id number]  Primary Physician Marianne Sofia, PA-C Primary Cardiologist: Runell Gess MD Nicholes Calamity, [place]  HPI:  Brent Duarte is a 66 y.o.  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  He was referred by Dr. Brent Bulla  for cardiovascular evaluation because of atypical back pain and an abnormal EKG. I last saw him in the office 06/04/2018. His risk factors include treated hypertension, diabetes and hyperlipidemia.  There is no family history of heart disease.  He has never had a heart attack or stroke.  He denies chest pain.  He developed some back pain at the end of March which was somewhat positional.  Occurs mostly when he is lying in bed at night especially on one side and goes away spontaneously after several minutes when he changes positions.  There are no other associated symptoms.  He did have a chest CT on 01/17/2018 that showed some mild calcification in the left coronary system.  His EKG provided by his PCP showed no acute changes.  He says the pain actually is improving over time.   Since I saw him 4 years ago he continues to do well.  His major limitation is his back.  He denies chest pain or shortness of breath.  He did have 2 episodes of what sounds like orthostatic hypotension.  He is on lisinopril for hypertension with blood pressures in the 100 range systolic.  His lipid profile is excellent recently checked 08/03/2022 with a total cholesterol 107, LDL 38 and HDL 29.  Current Meds  Medication Sig   acetaminophen (TYLENOL) 500 MG tablet Tylenol Extra Strength   allopurinol (ZYLOPRIM) 300 MG tablet Take 1 tablet (300 mg total) by mouth daily.   aspirin EC 81 MG tablet Take 81 mg by mouth daily. Swallow whole.   atorvastatin (LIPITOR) 40 MG tablet TAKE 1 TABLET BY MOUTH EVERY DAY   B-D 3CC LUER-LOK SYR 25GX1" 25G X 1" 3 ML MISC USE 1 ML BY DOES NOT APPLY ROUTE EVERY 14  (FOURTEEN) DAYS. ( 1 AND 1/2 INCH ON BACKORDER)   colesevelam (WELCHOL) 625 MG tablet TAKE 3 TABLETS (1,875 MG TOTAL) BY MOUTH DAILY.   dupilumab (DUPIXENT) 300 MG/2ML prefilled syringe Inject 300 mg into the skin every 14 (fourteen) days.   empagliflozin (JARDIANCE) 25 MG TABS tablet TAKE 1 TABLET BY MOUTH EVERY DAY BEFORE BREAKFAST (Patient taking differently: Take 10 mg by mouth daily. TAKE 1 TABLET BY MOUTH EVERY DAY BEFORE BREAKFAST)   gabapentin (NEURONTIN) 300 MG capsule Take 300 mg by mouth. Take 6 tablets as needed   glucose blood (ONETOUCH VERIO) test strip 1 each by Other route daily. Use as instructed   lisinopril (ZESTRIL) 20 MG tablet TAKE 1 TABLET BY MOUTH EVERY DAY AS DIRECTED   metFORMIN (GLUCOPHAGE-XR) 500 MG 24 hr tablet TAKE 2 TABLETS BY MOUTH EVERY DAY WITH BREAKFAST   methocarbamol (ROBAXIN) 500 MG tablet Take 500 mg by mouth at bedtime.   omega-3 acid ethyl esters (LOVAZA) 1 g capsule TAKE 2 CAPSULES BY MOUTH TWICE A DAY   oxyCODONE-acetaminophen (PERCOCET) 10-325 MG tablet Take 1 tablet by mouth every 4 (four) hours as needed for pain.   sitaGLIPtin (JANUVIA) 50 MG tablet Take 1 tablet (50 mg total) by mouth daily.   tamsulosin (FLOMAX) 0.4 MG CAPS capsule Take 0.4 mg by mouth daily.   testosterone cypionate (DEPOTESTOSTERONE CYPIONATE) 200  MG/ML injection INJECT INTRAMUSCULARLY ONCE A MONTH   Current Facility-Administered Medications for the 08/22/22 encounter (Office Visit) with Runell Gess, MD  Medication   testosterone cypionate (DEPOTESTOSTERONE CYPIONATE) injection 200 mg     Allergies  Allergen Reactions   Bromocriptine Other (See Comments)    GI issues   Ciprofloxacin Other (See Comments)    "Tendon issues" Other reaction(s): other   Dulaglutide Other (See Comments) and Nausea Only    GI issues    Social History   Socioeconomic History   Marital status: Married    Spouse name: Not on file   Number of children: Not on file   Years of  education: Not on file   Highest education level: Not on file  Occupational History    Employer: [employer]   Tobacco Use   Smoking status: Never   Smokeless tobacco: Never  Substance and Sexual Activity   Alcohol use: No   Drug use: No   Sexual activity: Yes    Partners: Female  Other Topics Concern   Not on file  Social History Narrative   Not on file   Social Determinants of Health   Financial Resource Strain: Not on file  Food Insecurity: Not on file  Transportation Needs: Not on file  Physical Activity: Not on file  Stress: Not on file  Social Connections: Not on file  Intimate Partner Violence: Not on file     Review of Systems: General: negative for chills, fever, night sweats or weight changes.  Cardiovascular: negative for chest pain, dyspnea on exertion, edema, orthopnea, palpitations, paroxysmal nocturnal dyspnea or shortness of breath Dermatological: negative for rash Respiratory: negative for cough or wheezing Urologic: negative for hematuria Abdominal: negative for nausea, vomiting, diarrhea, bright red blood per rectum, melena, or hematemesis Neurologic: negative for visual changes, syncope, or dizziness All other systems reviewed and are otherwise negative except as noted above.    Blood pressure 101/63, pulse 96, height 5\' 11"  (1.803 m), weight 277 lb 6.4 oz (125.8 kg), SpO2 96 %.  General appearance: alert and no distress Neck: no adenopathy, no carotid bruit, no JVD, supple, symmetrical, trachea midline, and thyroid not enlarged, symmetric, no tenderness/mass/nodules Lungs: clear to auscultation bilaterally Heart: regular rate and rhythm, S1, S2 normal, no murmur, click, rub or gallop Extremities: extremities normal, atraumatic, no cyanosis or edema Pulses: 2+ and symmetric Skin: Skin color, texture, turgor normal. No rashes or lesions Neurologic: Grossly normal  EKG sinus rhythm at 94 with low limb voltage.  I personally reviewed this  EKG.  ASSESSMENT AND PLAN:   Essential hypertension History of essential hypertension blood pressure measured today at 101/63.  He is on lisinopril.  He had an episode of what sounds like orthostatic hypotension with dizziness when getting up quickly.  We talked about slow changes in position.  Mixed hyperlipidemia History of hyperlipidemia on atorvastatin and Lovaza with lipid profile performed 08/03/2022 revealing total cholesterol 107, LDL 38 and HDL 29.     08/05/2022 MD [facility], [facility] 08/22/2022 9:51 AM

## 2022-08-22 NOTE — Assessment & Plan Note (Signed)
History of essential hypertension blood pressure measured today at 101/63.  He is on lisinopril.  He had an episode of what sounds like orthostatic hypotension with dizziness when getting up quickly.  We talked about slow changes in position.

## 2022-09-04 ENCOUNTER — Ambulatory Visit
Admission: RE | Admit: 2022-09-04 | Discharge: 2022-09-04 | Disposition: A | Discharge status: Home / Self Care | Payer: 59 | Source: Ambulatory Visit | Attending: Cardiovascular Disease | Admitting: Cardiovascular Disease

## 2022-09-04 DIAGNOSIS — E782 Mixed hyperlipidemia: Secondary | ICD-10-CM | POA: Insufficient documentation

## 2022-09-04 DIAGNOSIS — I10 Essential (primary) hypertension: Secondary | ICD-10-CM | POA: Insufficient documentation

## 2022-09-07 ENCOUNTER — Other Ambulatory Visit: Payer: Self-pay | Admitting: Physician Assistant

## 2022-09-17 ENCOUNTER — Ambulatory Visit (INDEPENDENT_AMBULATORY_CARE_PROVIDER_SITE_OTHER): Payer: 59

## 2022-09-17 DIAGNOSIS — E291 Testicular hypofunction: Secondary | ICD-10-CM

## 2022-09-17 NOTE — Progress Notes (Signed)
   Testosterone injection given per order, patient tolerated well.   [name], LPN 3:29 AM

## 2022-09-25 ENCOUNTER — Other Ambulatory Visit: Payer: Self-pay | Admitting: Family Medicine

## 2022-09-25 ENCOUNTER — Other Ambulatory Visit: Payer: Self-pay

## 2022-09-26 ENCOUNTER — Other Ambulatory Visit: Payer: Self-pay | Admitting: Legal Medicine

## 2022-10-04 ENCOUNTER — Encounter: Payer: Self-pay | Admitting: Physician Assistant

## 2022-10-04 ENCOUNTER — Ambulatory Visit (INDEPENDENT_AMBULATORY_CARE_PROVIDER_SITE_OTHER): Payer: 59 | Admitting: Physician Assistant

## 2022-10-04 VITALS — BP 100/66 | HR 89 | Temp 97.1°F | Ht 71.0 in | Wt 270.0 lb

## 2022-10-04 DIAGNOSIS — E1122 Type 2 diabetes mellitus with diabetic chronic kidney disease: Secondary | ICD-10-CM | POA: Diagnosis not present

## 2022-10-04 LAB — COMPREHENSIVE METABOLIC PANEL
ALT: 20 IU/L (ref 0–44)
AST: 12 IU/L (ref 0–40)
Albumin/Globulin Ratio: 2.5 — ABNORMAL HIGH (ref 1.2–2.2)
Albumin: 4.7 g/dL (ref 3.9–4.9)
Alkaline Phosphatase: 104 IU/L (ref 44–121)
BUN/Creatinine Ratio: 27 — ABNORMAL HIGH (ref 10–24)
BUN: 42 mg/dL — ABNORMAL HIGH (ref 8–27)
Bilirubin Total: 0.8 mg/dL (ref 0.0–1.2)
CO2: 19 mmol/L — ABNORMAL LOW (ref 20–29)
Calcium: 9.9 mg/dL (ref 8.6–10.2)
Chloride: 100 mmol/L (ref 96–106)
Creatinine, Ser: 1.58 mg/dL — ABNORMAL HIGH (ref 0.76–1.27)
Globulin, Total: 1.9 g/dL (ref 1.5–4.5)
Glucose: 260 mg/dL — ABNORMAL HIGH (ref 70–99)
Potassium: 5.5 mmol/L — ABNORMAL HIGH (ref 3.5–5.2)
Sodium: 136 mmol/L (ref 134–144)
Total Protein: 6.6 g/dL (ref 6.0–8.5)
eGFR: 48 mL/min/{1.73_m2} — ABNORMAL LOW (ref >59)

## 2022-10-04 LAB — HEMOGLOBIN A1C
Est. average glucose Bld gHb Est-mCnc: 240 mg/dL
Hgb A1c MFr Bld: 10 % — ABNORMAL HIGH (ref 4.8–5.6)

## 2022-10-04 NOTE — Progress Notes (Signed)
Subjective:  Patient ID: Brent Duarte, male    DOB: 01-07-1956  Age: 66 y.o. MRN: [id number]  Chief Complaint  Patient presents with   Diabetes    HPI  Pt here for follow up of diabetes - he increased Januvia to 50mg  qd and is taking glucophage and jardiance.  He states his sugars are still ranging fasting 250s-300s. 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  He had coronary CT done which was stable and no further recommendations or changes made  Pt will be seeing nephrologist today for initial evaluation for abnormal kidney functions   Current Outpatient Medications on File Prior to Visit  Medication Sig Dispense Refill   acetaminophen (TYLENOL) 500 MG tablet Tylenol Extra Strength     allopurinol (ZYLOPRIM) 300 MG tablet TAKE 1 TABLET BY MOUTH EVERY DAY 90 tablet 1   aspirin EC 81 MG tablet Take 81 mg by mouth daily. Swallow whole.     atorvastatin (LIPITOR) 40 MG tablet TAKE 1 TABLET BY MOUTH EVERY DAY 90 tablet 2   B-D 3CC LUER-LOK SYR 25GX1" 25G X 1" 3 ML MISC USE 1 ML BY DOES NOT APPLY ROUTE EVERY 14 (FOURTEEN) DAYS. ( 1 AND 1/2 INCH ON BACKORDER)     colesevelam (WELCHOL) 625 MG tablet TAKE 3 TABLETS (1,875 MG TOTAL) BY MOUTH DAILY. 270 tablet 0   dupilumab (DUPIXENT) 300 MG/2ML prefilled syringe Inject 300 mg into the skin every 14 (fourteen) days.     finasteride (PROSCAR) 5 MG tablet Take 5 mg by mouth daily.     gabapentin (NEURONTIN) 300 MG capsule Take 300 mg by mouth. Take 6 tablets as needed     glucose blood (ONETOUCH VERIO) test strip 1 each by Other route daily. Use as instructed 100 each 3   JARDIANCE 25 MG TABS tablet TAKE 1 TABLET BY MOUTH EVERY DAY BEFORE BREAKFAST 30 tablet 11   lisinopril (ZESTRIL) 20 MG tablet TAKE 1 TABLET BY MOUTH EVERY DAY AS DIRECTED 90 tablet 2   metFORMIN (GLUCOPHAGE-XR) 500 MG 24 hr  tablet TAKE 2 TABLETS BY MOUTH EVERY DAY WITH BREAKFAST 180 tablet 1   methocarbamol (ROBAXIN) 500 MG tablet Take 500 mg by mouth at bedtime.     omega-3 acid ethyl esters (LOVAZA) 1 g capsule TAKE 2 CAPSULES BY MOUTH TWICE A DAY 360 capsule 2   oxyCODONE-acetaminophen (PERCOCET) 10-325 MG tablet Take 1 tablet by mouth every 4 (four) hours as needed for pain. 60 tablet 0   sitaGLIPtin (JANUVIA) 50 MG tablet Take 1 tablet (50 mg total) by mouth daily. 30 tablet 2   tamsulosin (FLOMAX) 0.4 MG CAPS capsule Take 0.4 mg by mouth daily.     testosterone cypionate (DEPOTESTOSTERONE CYPIONATE) 200 MG/ML injection INJECT INTRAMUSCULARLY ONCE A MONTH 3 mL 1   Current Facility-Administered Medications on File Prior to Visit  Medication Dose Route Frequency Provider Last Rate Last Admin   testosterone cypionate (DEPOTESTOSTERONE CYPIONATE) injection 200 mg  200 mg Intramuscular Q30 days , MD   200 mg at 09/17/22 14/04/23   Past Medical History:  Diagnosis Date   Arthritis    BPH without obstruction/lower urinary tract symptoms 02/01/2020   History of chicken pox    History of kidney stones    History of measles    History of mumps  Hyperlipidemia    Idiopathic gout of right knee 02/01/2020   Imbalance    secondary to back issues   Multiple gastric ulcers    Numbness and tingling of both legs    Pneumonia    Psoriasis vulgaris 02/01/2020   Recurrent falls    Past Surgical History:  Procedure Laterality Date   bicep tear     right lower arm related to overgrowth of bone as stated per pt    KNEE ARTHROSCOPY     bilat   lithrotripsy     for kidney stones   LUMBAR LAMINECTOMY/DECOMPRESSION MICRODISCECTOMY N/A 09/07/2015   Procedure: MICRO LUMBAR DECOMPRESSION L4 - L5, REMOVAL OF SYNOVIAL CYST L4-L5;  Surgeon: Jene Every, MD;  Location: [facility];  Service: Orthopedics;  Laterality: N/A;   URETERAL STENT PLACEMENT     secondary to kidney stones/pt states had laser surgery prior      Family History  Problem Relation Age of Onset   Lung cancer Mother    Lung cancer Father    Diabetes Neg Hx    Social History   Socioeconomic History   Marital status: Married    Spouse name: Not on file   Number of children: Not on file   Years of education: Not on file   Highest education level: Not on file  Occupational History    Employer: [employer]   Tobacco Use   Smoking status: Never   Smokeless tobacco: Never  Substance and Sexual Activity   Alcohol use: No   Drug use: No   Sexual activity: Yes    Partners: Female  Other Topics Concern   Not on file  Social History Narrative   Not on file   Social Determinants of Health   Financial Resource Strain: Not on file  Food Insecurity: Not on file  Transportation Needs: Not on file  Physical Activity: Not on file  Stress: Not on file  Social Connections: Not on file    Review of Systems CONSTITUTIONAL: Negative for chills, fatigue, fever, unintentional weight gain and unintentional weight loss.  E/N/T: Negative for ear pain, nasal congestion and sore throat.  CARDIOVASCULAR: Negative for chest pain, dizziness, palpitations and pedal edema.  RESPIRATORY: Negative for recent cough and dyspnea.  GASTROINTESTINAL: Negative for abdominal pain, acid reflux symptoms, constipation, diarrhea, nausea and vomiting.        Objective:  PHYSICAL EXAM:   VS: BP 100/66 (BP Location: Left Arm, Patient Position: Sitting, Cuff Size: Large)   Pulse 89   Temp (!) 97.1 F (36.2 C) (Temporal)   Ht 5\' 11"  (1.803 m)   Wt 270 lb (122.5 kg)   SpO2 96%   BMI 37.66 kg/m   GEN: Well nourished, well developed, in no acute distress  Cardiac: RRR; no murmurs, rubs, or gallops,no edema - Respiratory:  normal respiratory rate and pattern with no distress - normal breath sounds with no rales, rhonchi, wheezes or rubs MS: no deformity or atrophy  Skin: warm and dry, no rash  Psych: euthymic mood, appropriate affect and  demeanor  Lab Results  Component Value Date   WBC 6.5 08/03/2022   HGB 15.2 08/03/2022   HCT 44.9 08/03/2022   PLT 125 (L) 08/03/2022   GLUCOSE 214 (H) 08/03/2022   CHOL 107 08/03/2022   TRIG 259 (H) 08/03/2022   HDL 29 (L) 08/03/2022   LDLCALC 38 08/03/2022   ALT 14 08/03/2022   AST 12 08/03/2022   NA 137 08/03/2022   K  4.5 08/03/2022   CL 100 08/03/2022   CREATININE 1.64 (H) 08/03/2022   BUN 29 (H) 08/03/2022   CO2 19 (L) 08/03/2022   TSH 1.840 08/03/2022   HGBA1C 8.4 (H) 06/04/2022      Assessment & Plan:   Problem List Items Addressed This Visit   None Visit Diagnoses     Type 2 diabetes mellitus with diabetic chronic kidney disease, unspecified CKD stage, unspecified whether long term insulin use (HCC)    -  Primary   Relevant Orders   Comprehensive metabolic panel   Hemoglobin A1c Continue current medical therapy and will adjust meds according to labwork     .continue other meds as directed  No orders of the defined types were placed in this encounter.   Orders Placed This Encounter  Procedures   Comprehensive metabolic panel   Hemoglobin A1c     Follow-up: Return in about 3 months (around 01/03/2023) for chronic fasting follow up.  An After Visit Summary was printed and given to the patient.  Jettie Pagan [facility] [phone]

## 2022-10-10 ENCOUNTER — Other Ambulatory Visit: Payer: Self-pay

## 2022-10-10 DIAGNOSIS — E1165 Type 2 diabetes mellitus with hyperglycemia: Secondary | ICD-10-CM

## 2022-10-10 DIAGNOSIS — I10 Essential (primary) hypertension: Secondary | ICD-10-CM

## 2022-10-10 DIAGNOSIS — E1122 Type 2 diabetes mellitus with diabetic chronic kidney disease: Secondary | ICD-10-CM

## 2022-10-10 DIAGNOSIS — E782 Mixed hyperlipidemia: Secondary | ICD-10-CM

## 2022-10-10 DIAGNOSIS — E291 Testicular hypofunction: Secondary | ICD-10-CM

## 2022-10-10 MED ORDER — METFORMIN HCL ER 500 MG PO TB24: ORAL_TABLET | ORAL | 1 refills | Status: DC

## 2022-10-10 MED ORDER — EMPAGLIFLOZIN 25 MG PO TABS: ORAL_TABLET | ORAL | 1 refills | Status: DC

## 2022-10-10 MED ORDER — SITAGLIPTIN PHOSPHATE 50 MG PO TABS: 50.0000 mg | ORAL_TABLET | Freq: Every day | ORAL | 1 refills | Status: DC

## 2022-10-10 MED ORDER — LISINOPRIL 20 MG PO TABS: 20.0000 mg | ORAL_TABLET | Freq: Every day | ORAL | 1 refills | Status: DC

## 2022-10-10 MED ORDER — TESTOSTERONE CYPIONATE 200 MG/ML IM SOLN: INTRAMUSCULAR | 1 refills | Status: DC

## 2022-10-10 MED ORDER — OMEGA-3-ACID ETHYL ESTERS 1 G PO CAPS: 2.0000 | ORAL_CAPSULE | Freq: Two times a day (BID) | ORAL | 1 refills | Status: DC

## 2022-10-10 NOTE — Telephone Encounter (Signed)
Patient would like to try insulin.

## 2022-10-11 ENCOUNTER — Telehealth: Payer: Self-pay

## 2022-10-11 ENCOUNTER — Other Ambulatory Visit: Payer: Self-pay | Admitting: Family Medicine

## 2022-10-11 ENCOUNTER — Ambulatory Visit (INDEPENDENT_AMBULATORY_CARE_PROVIDER_SITE_OTHER): Payer: 59

## 2022-10-11 DIAGNOSIS — E291 Testicular hypofunction: Secondary | ICD-10-CM | POA: Diagnosis not present

## 2022-10-11 MED ORDER — TESTOSTERONE CYPIONATE 200 MG/ML IM SOLN: 100.0000 mg | INTRAMUSCULAR | Status: DC

## 2022-10-11 MED ORDER — TESTOSTERONE CYPIONATE 200 MG/ML IM SOLN
200.0000 mg | INTRAMUSCULAR | Status: DC
  Administered 2022-10-11 – 2023-05-13 (×6): 200 mg via INTRAMUSCULAR

## 2022-10-11 MED ORDER — TRESIBA FLEXTOUCH 200 UNIT/ML ~~LOC~~ SOPN: 20.0000 [IU] | PEN_INJECTOR | Freq: Every day | SUBCUTANEOUS | 0 refills | Status: DC

## 2022-10-11 NOTE — Progress Notes (Signed)
° °  Testosterone injection given per order, patient tolerated well.  ° °Monya Kozakiewicz M Lynisha Osuch, LPN °10:35 AM ° °

## 2022-10-11 NOTE — Telephone Encounter (Signed)
Per Dr.[facility]ox: sent tresiba 20 U before bed. I am not comfortable with him taking mounjaro with history of pancreatitis.  Called patient made aware of medication change. Also told patient if he needs help with new medication after he picks it up from pharmacy come by the office so a nurse can help him.  Patient Made Aware, Verbalized Understanding.

## 2022-10-18 ENCOUNTER — Other Ambulatory Visit: Payer: Self-pay | Admitting: Physician Assistant

## 2022-10-18 ENCOUNTER — Telehealth: Payer: Self-pay

## 2022-10-18 MED ORDER — NOVOFINE PEN NEEDLE 32G X 6 MM MISC: 3 refills | Status: DC

## 2022-10-18 NOTE — Telephone Encounter (Signed)
Patient called and stated that he did not receive his insulin needles at the pharmacy.

## 2022-10-22 ENCOUNTER — Other Ambulatory Visit: Payer: Self-pay | Admitting: Physician Assistant

## 2022-10-31 ENCOUNTER — Telehealth: Payer: Self-pay

## 2022-10-31 NOTE — Telephone Encounter (Signed)
Called patient left message for patient to call office back. 

## 2022-10-31 NOTE — Telephone Encounter (Signed)
Patient called and stated that his blood sugar was 433 this morning and wanted to know should he go up on his insulin.Please advise

## 2022-11-01 NOTE — Telephone Encounter (Signed)
Patient stated fasting BS is 200's, to 300's, he is doing 20 units once a day at bed time.  Per Provider: Go up 4 units tonight, than go up 4 units every 3 days until fasting blood sugar is lower than 200. Keep a log on Blood sugar and bring them in at th next appointment. Call office if he has any questions.  Patient made aware, repeated instructions verbalized understanding.

## 2022-11-13 ENCOUNTER — Other Ambulatory Visit: Payer: Self-pay | Admitting: Family Medicine

## 2022-12-06 ENCOUNTER — Encounter: Payer: Self-pay | Admitting: Physician Assistant

## 2022-12-06 ENCOUNTER — Ambulatory Visit (INDEPENDENT_AMBULATORY_CARE_PROVIDER_SITE_OTHER): Payer: Medicare Other | Admitting: Physician Assistant

## 2022-12-06 VITALS — BP 98/60 | HR 86 | Temp 97.7°F | Resp 14 | Ht 71.0 in | Wt 270.0 lb

## 2022-12-06 DIAGNOSIS — Z794 Long term (current) use of insulin: Secondary | ICD-10-CM

## 2022-12-06 DIAGNOSIS — E0822 Diabetes mellitus due to underlying condition with diabetic chronic kidney disease: Secondary | ICD-10-CM

## 2022-12-06 DIAGNOSIS — E782 Mixed hyperlipidemia: Secondary | ICD-10-CM

## 2022-12-06 DIAGNOSIS — I959 Hypotension, unspecified: Secondary | ICD-10-CM | POA: Insufficient documentation

## 2022-12-06 DIAGNOSIS — E1122 Type 2 diabetes mellitus with diabetic chronic kidney disease: Secondary | ICD-10-CM | POA: Diagnosis not present

## 2022-12-06 DIAGNOSIS — E291 Testicular hypofunction: Secondary | ICD-10-CM

## 2022-12-06 DIAGNOSIS — I9589 Other hypotension: Secondary | ICD-10-CM

## 2022-12-06 DIAGNOSIS — N1831 Chronic kidney disease, stage 3a: Secondary | ICD-10-CM

## 2022-12-06 MED ORDER — ONETOUCH VERIO VI STRP: ORAL_STRIP | 3 refills | Status: DC

## 2022-12-06 NOTE — Progress Notes (Signed)
Established Patient Office Visit  Subjective:  Patient ID: Brent Duarte, male    DOB: 11-Jan-1956  Age: 67 y.o. MRN: [id number]  CC:  Chief Complaint  Patient presents with   Diabetes   Hypertension   Hyperlipidemia    HPI Brent Duarte presents for chronic follow up  Pt with past history of hypertension for several years.  At one point he was on lisinopril 53m qd and over the past several months had decreased down to 16mqd He recently has noted that bp has been low when checked at other offices.  He actually has had some drops of bp to 90/60.  Pt has recently seen cardiology and had coronary CT done.   He is also following with nephrology who he saw recently and had advised him to stop the lisinopril altogether.  He said he stopped it for 2-3 days but randomly checked his blood pressure and it was elevated and he restarted the medication again Pt brings his bp cuff in today - it is too small for his arm and when checking bp it jumps around from 60-150/60-90 Our readings consistently 98-100/60  Pt with history of IDDM --- over the past several months he was started on Tresiba and has titrated up to 44 units daily.  His fasting glucose had been ranging in the upper 300s but over the past several weeks they have come down to 170s-low 200s.  He is also taking metformin 50069mnd Jardiance 37m19mHe states the jardiance is too expensive and he will check with insurance to see if anything else is available.  He will also be stopping Januvia that he is currently taking because it is too expensive for him to continue (and with adjusting insulin for better glucose control as well as his history of renal insufficiency will monitor how glucose control goes)  Pt with history of hyperlipidemia - taking atorvastatin 40mg71m, welchol and omega 3 fish oil - due for labwork  Pt with history of testosterone deficiency - currently on supplement Past Medical History:  Diagnosis Date   Arthritis     BPH without obstruction/lower urinary tract symptoms 02/01/2020   History of chicken pox    History of kidney stones    History of measles    History of mumps    Hyperlipidemia    Idiopathic gout of right knee 02/01/2020   Imbalance    secondary to back issues   Multiple gastric ulcers    Numbness and tingling of both legs    Pneumonia    Psoriasis vulgaris 02/01/2020   Recurrent falls     Past Surgical History:  Procedure Laterality Date   bicep tear     right lower arm related to overgrowth of bone as stated per pt    KNEE ARTHROSCOPY     bilat   lithrotripsy     for kidney stones   LUMBAR LAMINECTOMY/DECOMPRESSION MICRODISCECTOMY N/A 09/07/2015   Procedure: MICRO LUMBAR DECOMPRESSION L4 - L5, REMOVAL OF SYNOVIAL CYST L4-L5;  Surgeon: JeffrSusa Day  Location: [facility];  Service: Orthopedics;  Laterality: N/A;   URETERAL STENT PLACEMENT     secondary to kidney stones/pt states had laser surgery prior     Family History  Problem Relation Age of Onset   Lung cancer Mother    Lung cancer Father    Diabetes Neg Hx     Social History   Socioeconomic History   Marital status: Married  Spouse name: Not on file   Number of children: Not on file   Years of education: Not on file   Highest education level: Not on file  Occupational History    Employer: [employer]   Tobacco Use   Smoking status: Never   Smokeless tobacco: Never  Substance and Sexual Activity   Alcohol use: No   Drug use: No   Sexual activity: Yes    Partners: Female  Other Topics Concern   Not on file  Social History Narrative   Not on file   Social Determinants of Health   Financial Resource Strain: Not on file  Food Insecurity: Not on file  Transportation Needs: Not on file  Physical Activity: Not on file  Stress: Not on file  Social Connections: Not on file  Intimate Partner Violence: Not on file     Current Outpatient Medications:    acetaminophen (TYLENOL) 500 MG tablet,  Tylenol Extra Strength, Disp: , Rfl:    allopurinol (ZYLOPRIM) 300 MG tablet, TAKE 1 TABLET BY MOUTH EVERY DAY, Disp: 90 tablet, Rfl: 1   aspirin EC 81 MG tablet, Take 81 mg by mouth daily. Swallow whole., Disp: , Rfl:    atorvastatin (LIPITOR) 40 MG tablet, TAKE 1 TABLET BY MOUTH EVERY DAY, Disp: 90 tablet, Rfl: 2   B-D 3CC LUER-LOK SYR 25GX1" 25G X 1" 3 ML MISC, USE 1 ML BY DOES NOT APPLY ROUTE EVERY 14 (FOURTEEN) DAYS. ( 1 AND 1/2 INCH ON BACKORDER), Disp: , Rfl:    colesevelam (WELCHOL) 625 MG tablet, TAKE 3 TABLETS (1,875 MG TOTAL) BY MOUTH DAILY., Disp: 270 tablet, Rfl: 0   dupilumab (DUPIXENT) 300 MG/2ML prefilled syringe, Inject 300 mg into the skin every 14 (fourteen) days., Disp: , Rfl:    empagliflozin (JARDIANCE) 25 MG TABS tablet, TAKE 1 TABLET BY MOUTH EVERY DAY BEFORE BREAKFAST, Disp: 90 tablet, Rfl: 1   finasteride (PROSCAR) 5 MG tablet, Take 5 mg by mouth daily., Disp: , Rfl:    gabapentin (NEURONTIN) 300 MG capsule, Take 300 mg by mouth. Take 6 tablets as needed, Disp: , Rfl:    insulin degludec (TRESIBA FLEXTOUCH) 200 UNIT/ML FlexTouch Pen, INJECT 20 UNITS INTO THE SKIN AT BEDTIME. (Patient taking differently: Inject 44 Units into the skin at bedtime.), Disp: 3 mL, Rfl: 2   Insulin Pen Needle (NOVOFINE PEN NEEDLE) 32G X 6 MM MISC, To use with insulin qd, Disp: 100 each, Rfl: 3   metFORMIN (GLUCOPHAGE-XR) 500 MG 24 hr tablet, TAKE 2 TABLETS BY MOUTH EVERY DAY WITH BREAKFAST, Disp: 180 tablet, Rfl: 1   omega-3 acid ethyl esters (LOVAZA) 1 g capsule, Take 2 capsules (2 g total) by mouth 2 (two) times daily., Disp: 360 capsule, Rfl: 1   oxyCODONE-acetaminophen (PERCOCET) 10-325 MG tablet, Take 1 tablet by mouth every 4 (four) hours as needed for pain., Disp: 60 tablet, Rfl: 0   tamsulosin (FLOMAX) 0.4 MG CAPS capsule, Take 0.4 mg by mouth daily., Disp: , Rfl:    glucose blood (ONETOUCH VERIO) test strip, Check glucose bid, Disp: 100 each, Rfl: 3  Current Facility-Administered  Medications:    testosterone cypionate (DEPOTESTOSTERONE CYPIONATE) injection 200 mg, 200 mg, Intramuscular, Q30 days, Brent Duncans, PA-C, 200 mg at 10/11/22 1031   Allergies  Allergen Reactions   Bromocriptine Other (See Comments)    GI issues   Ciprofloxacin Other (See Comments)    "Tendon issues" Other reaction(s): other   Dulaglutide Other (See Comments) and Nausea Only  GI issues    ROS CONSTITUTIONAL: Negative for chills, fatigue, fever, unintentional weight gain and unintentional weight loss.  E/N/T: Negative for ear pain, nasal congestion and sore throat.  CARDIOVASCULAR: Negative for chest pain, dizziness, palpitations and pedal edema.  RESPIRATORY: Negative for recent cough and dyspnea.  GASTROINTESTINAL: Negative for abdominal pain, acid reflux symptoms, constipation, diarrhea, nausea and vomiting.  MSK: Negative for arthralgias and myalgias.  INTEGUMENTARY: Negative for rash.  NEUROLOGICAL: Negative for dizziness and headaches.  PSYCHIATRIC: Negative for sleep disturbance and to question depression screen.  Negative for depression, negative for anhedonia.        Objective:    PHYSICAL EXAM:   VS: BP 98/60 (BP Location: Left Arm, Patient Position: Sitting)   Pulse 86   Temp 97.7 F (36.5 C)   Resp 14   Ht 5' 11"$  (1.803 m)   Wt 270 lb (122.5 kg)   SpO2 98%   BMI 37.66 kg/m   GEN: Well nourished, well developed, in no acute distress  Neck: no JVD or masses - no thyromegaly Cardiac: RRR; no murmurs, rubs, or gallops,no edema -  Respiratory:  normal respiratory rate and pattern with no distress - normal breath sounds with no rales, rhonchi, wheezes or rubs MS: no deformity or atrophy  Skin: warm and dry, no rash  Psych: euthymic mood, appropriate affect and demeanor    Health Maintenance Due  Topic Date Due   Medicare Annual Wellness (AWV)  Never done   OPHTHALMOLOGY EXAM  08/25/2022   Diabetic kidney evaluation - Urine ACR  12/20/2022    There are  no preventive care reminders to display for this patient.  Lab Results  Component Value Date   TSH 1.840 08/03/2022   Lab Results  Component Value Date   WBC 6.5 08/03/2022   HGB 15.2 08/03/2022   HCT 44.9 08/03/2022   MCV 95 08/03/2022   PLT 125 (L) 08/03/2022   Lab Results  Component Value Date   NA 136 10/04/2022   K 5.5 (H) 10/04/2022   CO2 19 (L) 10/04/2022   GLUCOSE 260 (H) 10/04/2022   BUN 42 (H) 10/04/2022   CREATININE 1.58 (H) 10/04/2022   BILITOT 0.8 10/04/2022   ALKPHOS 104 10/04/2022   AST 12 10/04/2022   ALT 20 10/04/2022   PROT 6.6 10/04/2022   ALBUMIN 4.7 10/04/2022   CALCIUM 9.9 10/04/2022   ANIONGAP 7 08/31/2015   EGFR 48 (L) 10/04/2022   GFR 47.85 (L) 03/09/2022   Lab Results  Component Value Date   CHOL 107 08/03/2022   Lab Results  Component Value Date   HDL 29 (L) 08/03/2022   Lab Results  Component Value Date   LDLCALC 38 08/03/2022   Lab Results  Component Value Date   TRIG 259 (H) 08/03/2022   Lab Results  Component Value Date   CHOLHDL 3.7 08/03/2022   Lab Results  Component Value Date   HGBA1C 10.0 (H) 10/04/2022      Assessment & Plan:   Problem List Items Addressed This Visit       Endocrine   Type 2 diabetes mellitus with stage 3a chronic kidney disease, without long-term current use of insulin ([place]) - Primary   Relevant Orders   Microalbumin / creatinine urine ratio   CBC with Differential/Platelet   Comprehensive metabolic panel   Hemoglobin A1c Continue jardiance, glucophage and tresiba -- recommend to continue to adjust dose by 4 units every 4 days until fasting glucose staying under 200  Other   Mixed hyperlipidemia   Relevant Orders   Lipid panel Continue meds   Other Visit Diagnoses     Testicular hypofunction     Continue meds   Other specified hypotension     Stop lisinopril   Relevant Orders   TSH                Meds ordered this encounter  Medications   glucose blood (ONETOUCH  VERIO) test strip    Sig: Check glucose bid    Dispense:  100 each    Refill:  3    Order Specific Question:   Supervising Provider    AnswerShelton Silvas    Follow-up: Return in about 6 weeks (around 01/17/2023) for follow up.    SARA R Lauralynn Loeb, PA-C

## 2022-12-07 LAB — HEMOGLOBIN A1C
Est. average glucose Bld gHb Est-mCnc: 214 mg/dL
Hgb A1c MFr Bld: 9.1 % — ABNORMAL HIGH (ref 4.8–5.6)

## 2022-12-07 LAB — CBC WITH DIFFERENTIAL/PLATELET
Basophils Absolute: 0 10*3/uL (ref 0.0–0.2)
Basos: 1 %
EOS (ABSOLUTE): 0.2 10*3/uL (ref 0.0–0.4)
Eos: 2 %
Hematocrit: 49.3 % (ref 37.5–51.0)
Hemoglobin: 16.5 g/dL (ref 13.0–17.7)
Immature Grans (Abs): 0.1 10*3/uL (ref 0.0–0.1)
Immature Granulocytes: 1 %
Lymphocytes Absolute: 1.1 10*3/uL (ref 0.7–3.1)
Lymphs: 18 %
MCH: 31.1 pg (ref 26.6–33.0)
MCHC: 33.5 g/dL (ref 31.5–35.7)
MCV: 93 fL (ref 79–97)
Monocytes Absolute: 0.5 10*3/uL (ref 0.1–0.9)
Monocytes: 7 %
Neutrophils Absolute: 4.4 10*3/uL (ref 1.4–7.0)
Neutrophils: 71 %
Platelets: 136 10*3/uL — ABNORMAL LOW (ref 150–450)
RBC: 5.31 x10E6/uL (ref 4.14–5.80)
RDW: 15 % (ref 11.6–15.4)
WBC: 6.3 10*3/uL (ref 3.4–10.8)

## 2022-12-07 LAB — COMPREHENSIVE METABOLIC PANEL
ALT: 16 IU/L (ref 0–44)
AST: 13 IU/L (ref 0–40)
Albumin/Globulin Ratio: 2.7 — ABNORMAL HIGH (ref 1.2–2.2)
Albumin: 4.8 g/dL (ref 3.9–4.9)
Alkaline Phosphatase: 113 IU/L (ref 44–121)
BUN/Creatinine Ratio: 22 (ref 10–24)
BUN: 25 mg/dL (ref 8–27)
Bilirubin Total: 0.9 mg/dL (ref 0.0–1.2)
CO2: 18 mmol/L — ABNORMAL LOW (ref 20–29)
Calcium: 10.2 mg/dL (ref 8.6–10.2)
Chloride: 101 mmol/L (ref 96–106)
Creatinine, Ser: 1.15 mg/dL (ref 0.76–1.27)
Globulin, Total: 1.8 g/dL (ref 1.5–4.5)
Glucose: 154 mg/dL — ABNORMAL HIGH (ref 70–99)
Potassium: 4.9 mmol/L (ref 3.5–5.2)
Sodium: 138 mmol/L (ref 134–144)
Total Protein: 6.6 g/dL (ref 6.0–8.5)
eGFR: 70 mL/min/{1.73_m2} (ref >59)

## 2022-12-07 LAB — TSH: TSH: 2.11 u[IU]/mL (ref 0.450–4.500)

## 2022-12-07 LAB — LIPID PANEL
Chol/HDL Ratio: 3.5 ratio (ref 0.0–5.0)
Cholesterol, Total: 124 mg/dL (ref 100–199)
HDL: 35 mg/dL — ABNORMAL LOW (ref >39)
LDL Chol Calc (NIH): 44 mg/dL (ref 0–99)
Triglycerides: 292 mg/dL — ABNORMAL HIGH (ref 0–149)
VLDL Cholesterol Cal: 45 mg/dL — ABNORMAL HIGH (ref 5–40)

## 2022-12-07 LAB — CARDIOVASCULAR RISK ASSESSMENT

## 2022-12-07 LAB — MICROALBUMIN / CREATININE URINE RATIO
Creatinine, Urine: 77.6 mg/dL
Microalb/Creat Ratio: <4 mg/g creat (ref 0–29)
Microalbumin, Urine: <3 ug/mL

## 2022-12-12 ENCOUNTER — Ambulatory Visit (INDEPENDENT_AMBULATORY_CARE_PROVIDER_SITE_OTHER): Payer: Medicare Other

## 2022-12-12 VITALS — BP 132/68 | HR 82

## 2022-12-12 DIAGNOSIS — E291 Testicular hypofunction: Secondary | ICD-10-CM | POA: Diagnosis not present

## 2022-12-12 DIAGNOSIS — I9589 Other hypotension: Secondary | ICD-10-CM

## 2022-12-12 NOTE — Progress Notes (Signed)
     Testosterone injection given per order, patient tolerated well.   Patient also here for BP check per Provider.  Results:     12/12/2022    8:33 AM 12/06/2022   11:00 AM 12/06/2022   10:32 AM  Vitals with BMI  Systolic [id number] 98 [id number]  Diastolic 68 60 60  Pulse 82  [place], LPN [id number]

## 2022-12-24 ENCOUNTER — Other Ambulatory Visit: Payer: Self-pay | Admitting: Physician Assistant

## 2022-12-24 DIAGNOSIS — E0822 Diabetes mellitus due to underlying condition with diabetic chronic kidney disease: Secondary | ICD-10-CM

## 2022-12-25 ENCOUNTER — Encounter: Payer: Self-pay | Admitting: Physician Assistant

## 2022-12-25 ENCOUNTER — Ambulatory Visit (INDEPENDENT_AMBULATORY_CARE_PROVIDER_SITE_OTHER): Payer: Medicare Other | Admitting: Physician Assistant

## 2022-12-25 VITALS — BP 118/66 | HR 88 | Temp 97.1°F | Ht 71.0 in | Wt 272.4 lb

## 2022-12-25 DIAGNOSIS — I951 Orthostatic hypotension: Secondary | ICD-10-CM | POA: Diagnosis not present

## 2022-12-25 DIAGNOSIS — J069 Acute upper respiratory infection, unspecified: Secondary | ICD-10-CM

## 2022-12-25 LAB — POCT INFLUENZA A/B
Influenza A, POC: NEGATIVE
Influenza B, POC: NEGATIVE

## 2022-12-25 LAB — POC COVID19 BINAXNOW: SARS Coronavirus 2 Ag: NEGATIVE

## 2022-12-25 MED ORDER — FLUTICASONE PROPIONATE 50 MCG/ACT NA SUSP: 2.0000 | Freq: Every day | NASAL | 6 refills | Status: DC

## 2022-12-25 MED ORDER — AZITHROMYCIN 250 MG PO TABS: ORAL_TABLET | ORAL | 0 refills | Status: AC

## 2022-12-25 NOTE — Progress Notes (Signed)
Acute Office Visit  Subjective:    Patient ID: Brent Duarte, male    DOB: Apr 14, 1956, 67 y.o.   MRN: [id number]  Chief Complaint  Patient presents with   Sore Throat   Cough    HPI Patient is in today for complaints of cough, congestion, sinus pressure and pnd for the past few days.  States daughter with same symptoms.  He has not had a fever.  Cough is nonproductive.  Has had sore throat as well  Pt states he has been checking blood pressure at home and still having some low readings - he has not gotten a new cuff yet (he brought to last visit and his is too small for his arm) - he denies chest pain/dyspnea Has seen cardiology and discussed with them as well -  He had been on lisinopril but now has been off that medication several weeks  Past Medical History:  Diagnosis Date   Arthritis    BPH without obstruction/lower urinary tract symptoms 02/01/2020   History of chicken pox    History of kidney stones    History of measles    History of mumps    Hyperlipidemia    Idiopathic gout of right knee 02/01/2020   Imbalance    secondary to back issues   Multiple gastric ulcers    Numbness and tingling of both legs    Pneumonia    Psoriasis vulgaris 02/01/2020   Recurrent falls     Past Surgical History:  Procedure Laterality Date   bicep tear     right lower arm related to overgrowth of bone as stated per pt    KNEE ARTHROSCOPY     bilat   lithrotripsy     for kidney stones   LUMBAR LAMINECTOMY/DECOMPRESSION MICRODISCECTOMY N/A 09/07/2015   Procedure: MICRO LUMBAR DECOMPRESSION L4 - L5, REMOVAL OF SYNOVIAL CYST L4-L5;  Surgeon: Susa Day, MD;  Location: [facility];  Service: Orthopedics;  Laterality: N/A;   URETERAL STENT PLACEMENT     secondary to kidney stones/pt states had laser surgery prior     Family History  Problem Relation Age of Onset   Lung cancer Mother    Lung cancer Father    Diabetes Neg Hx     Social History   Socioeconomic History   Marital  status: Married    Spouse name: Not on file   Number of children: Not on file   Years of education: Not on file   Highest education level: Not on file  Occupational History    Employer: [employer]   Tobacco Use   Smoking status: Never   Smokeless tobacco: Never  Substance and Sexual Activity   Alcohol use: No   Drug use: No   Sexual activity: Yes    Partners: Female  Other Topics Concern   Not on file  Social History Narrative   Not on file   Social Determinants of Health   Financial Resource Strain: Not on file  Food Insecurity: Not on file  Transportation Needs: Not on file  Physical Activity: Not on file  Stress: Not on file  Social Connections: Not on file  Intimate Partner Violence: Not on file     Current Outpatient Medications:    acetaminophen (TYLENOL) 500 MG tablet, Tylenol Extra Strength, Disp: , Rfl:    allopurinol (ZYLOPRIM) 300 MG tablet, TAKE 1 TABLET BY MOUTH EVERY DAY, Disp: 90 tablet, Rfl: 1   aspirin EC 81 MG tablet, Take 81  mg by mouth daily. Swallow whole., Disp: , Rfl:    atorvastatin (LIPITOR) 40 MG tablet, TAKE 1 TABLET BY MOUTH EVERY DAY, Disp: 90 tablet, Rfl: 2   azithromycin (ZITHROMAX) 250 MG tablet, Take 2 tablets on day 1, then 1 tablet daily on days 2 through 5, Disp: 6 tablet, Rfl: 0   B-D 3CC LUER-LOK SYR 25GX1" 25G X 1" 3 ML MISC, USE 1 ML BY DOES NOT APPLY ROUTE EVERY 14 (FOURTEEN) DAYS. ( 1 AND 1/2 INCH ON BACKORDER), Disp: , Rfl:    colesevelam (WELCHOL) 625 MG tablet, TAKE 3 TABLETS (1,875 MG TOTAL) BY MOUTH DAILY., Disp: 270 tablet, Rfl: 0   empagliflozin (JARDIANCE) 25 MG TABS tablet, TAKE 1 TABLET BY MOUTH EVERY DAY BEFORE BREAKFAST, Disp: 90 tablet, Rfl: 1   finasteride (PROSCAR) 5 MG tablet, Take 5 mg by mouth daily., Disp: , Rfl:    fluticasone (FLONASE) 50 MCG/ACT nasal spray, Place 2 sprays into both nostrils daily., Disp: 16 g, Rfl: 6   gabapentin (NEURONTIN) 300 MG capsule, Take 300 mg by mouth. Take 6 tablets as needed,  Disp: , Rfl:    glucose blood (ONETOUCH VERIO) test strip, USE TO TEST SUGAR DAILY, Disp: 100 strip, Rfl: 3   insulin degludec (TRESIBA FLEXTOUCH) 200 UNIT/ML FlexTouch Pen, INJECT 20 UNITS INTO THE SKIN AT BEDTIME. (Patient taking differently: Inject 60 Units into the skin at bedtime.), Disp: 3 mL, Rfl: 2   Insulin Pen Needle (NOVOFINE PEN NEEDLE) 32G X 6 MM MISC, To use with insulin qd, Disp: 100 each, Rfl: 3   metFORMIN (GLUCOPHAGE-XR) 500 MG 24 hr tablet, TAKE 2 TABLETS BY MOUTH EVERY DAY WITH BREAKFAST, Disp: 180 tablet, Rfl: 1   omega-3 acid ethyl esters (LOVAZA) 1 g capsule, Take 2 capsules (2 g total) by mouth 2 (two) times daily., Disp: 360 capsule, Rfl: 1   oxyCODONE-acetaminophen (PERCOCET) 10-325 MG tablet, Take 1 tablet by mouth every 4 (four) hours as needed for pain., Disp: 60 tablet, Rfl: 0   tamsulosin (FLOMAX) 0.4 MG CAPS capsule, Take 0.4 mg by mouth daily., Disp: , Rfl:    dupilumab (DUPIXENT) 300 MG/2ML prefilled syringe, Inject 300 mg into the skin every 14 (fourteen) days. (Patient not taking: Reported on 12/25/2022), Disp: , Rfl:   Current Facility-Administered Medications:    testosterone cypionate (DEPOTESTOSTERONE CYPIONATE) injection 200 mg, 200 mg, Intramuscular, Q30 days, Marge Duncans, PA-C, 200 mg at 12/12/22 [id number]   Allergies  Allergen Reactions   Bromocriptine Other (See Comments)    GI issues   Ciprofloxacin Other (See Comments)    "Tendon issues" Other reaction(s): other   Dulaglutide Other (See Comments) and Nausea Only    GI issues    CONSTITUTIONAL: Negative for chills, fatigue, fever,  E/N/T: see HPI CARDIOVASCULAR: Negative for chest pain, dizziness, palpitations and pedal edema.  RESPIRATORY: see HPI GASTROINTESTINAL: Negative for abdominal pain, acid reflux symptoms, constipation, diarrhea, nausea and vomiting.   Office Visit on 12/25/2022  Component Date Value Ref Range Status   Influenza A, POC 12/25/2022 Negative  Negative Final   Influenza  B, POC 12/25/2022 Negative  Negative Final   SARS Coronavirus 2 Ag 12/25/2022 Negative  Negative Final        Objective:    PHYSICAL EXAM:   VS: BP 118/66 (BP Location: Left Arm, Patient Position: Sitting, Cuff Size: Large)   Pulse 88   Temp (!) 97.1 F (36.2 C) (Temporal)   Ht '5\' 11"'$  (1.803 m)   Wt 272 lb  6.4 oz (123.6 kg)   SpO2 97%   BMI 37.99 kg/m  Orthostatic Vitals for the past 48 hrs (Last 6 readings):  Patient Position Orthostatic BP Orthostatic Pulse  12/25/22 1436 Sitting -- --  12/25/22 1453 Supine 120/64 86  12/25/22 1454 Sitting 110/70 91  12/25/22 1455 Standing 110/64 104    GEN: Well nourished, well developed, in no acute distress  HEENT: normal external ears and nose - TMS red, dull - Lips, Teeth and Gums - normal  Oropharynx - erythema noted Cardiac: RRR; no murmurs, rubs, Respiratory:  normal respiratory rate and pattern with no distress - normal breath sounds with no rales, rhonchi, wheezes or rubs   Wt Readings from Last 3 Encounters:  12/25/22 272 lb 6.4 oz (123.6 kg)  12/06/22 270 lb (122.5 kg)  10/04/22 270 lb (122.5 kg)    Health Maintenance Due  Topic Date Due   Medicare Annual Wellness (AWV)  Never done   OPHTHALMOLOGY EXAM  08/25/2022   FOOT EXAM  12/20/2022    There are no preventive care reminders to display for this patient.        Assessment & Plan:   Problem List Items Addressed This Visit       Cardiovascular and Mediastinum   orthostatic hypotension At this time stay off lisinopril Get new bp cuff to continue to monitor Hydrate well and be aware to move slower/get up slower Follow up with cardiology as directed     Respiratory   Acute upper respiratory infection - Primary   Relevant Orders   POCT Influenza A/B (Completed)   POC COVID-19 BinaxNow (Completed)     Meds ordered this encounter  Medications   fluticasone (FLONASE) 50 MCG/ACT nasal spray    Sig: Place 2 sprays into both nostrils daily.     Dispense:  16 g    Refill:  6    Order Specific Question:   Supervising Provider    Answer:   Shelton Silvas   azithromycin (ZITHROMAX) 250 MG tablet    Sig: Take 2 tablets on day 1, then 1 tablet daily on days 2 through 5    Dispense:  6 tablet    Refill:  0    Order Specific Question:   Supervising Provider    Answer:   Rochel Brome [id number]     [place], PA-C

## 2022-12-26 ENCOUNTER — Telehealth: Payer: Self-pay

## 2022-12-26 ENCOUNTER — Other Ambulatory Visit: Payer: Self-pay | Admitting: Physician Assistant

## 2022-12-26 MED ORDER — METOCLOPRAMIDE HCL 10 MG PO TABS: 10.0000 mg | ORAL_TABLET | Freq: Three times a day (TID) | ORAL | 1 refills | Status: DC

## 2022-12-26 NOTE — Telephone Encounter (Signed)
Patient informed. 

## 2022-12-26 NOTE — Telephone Encounter (Signed)
Patient called and left voicemail stating that he is still hiccuping and has not been able to sleep! He states he was supposed to call back if it was no better.

## 2022-12-27 NOTE — Telephone Encounter (Signed)
Patient Made Aware, Verbalized Understanding. Patient will go to urgent Care

## 2022-12-27 NOTE — Telephone Encounter (Signed)
Patient called and stated that he can barely swallow his throat is sore, he can't eat at all and is trying to drink as much as he can, he now has a fever and Congestion, and SOB.

## 2023-01-10 ENCOUNTER — Other Ambulatory Visit: Payer: Self-pay

## 2023-01-10 ENCOUNTER — Ambulatory Visit (INDEPENDENT_AMBULATORY_CARE_PROVIDER_SITE_OTHER): Payer: Medicare Other

## 2023-01-10 DIAGNOSIS — E291 Testicular hypofunction: Secondary | ICD-10-CM | POA: Diagnosis not present

## 2023-01-10 MED ORDER — TRESIBA FLEXTOUCH 200 UNIT/ML ~~LOC~~ SOPN: 60.0000 [IU] | PEN_INJECTOR | Freq: Every day | SUBCUTANEOUS | 3 refills | Status: DC

## 2023-01-10 NOTE — Progress Notes (Signed)
   Testosterone injection given per order, patient tolerated well.   [place]rie Noe, LPN [id number] AM

## 2023-01-17 ENCOUNTER — Ambulatory Visit (INDEPENDENT_AMBULATORY_CARE_PROVIDER_SITE_OTHER): Payer: Medicare Other | Admitting: Physician Assistant

## 2023-01-17 ENCOUNTER — Encounter: Payer: Self-pay | Admitting: Physician Assistant

## 2023-01-17 VITALS — BP 110/62 | HR 72 | Temp 96.3°F | Resp 18 | Ht 71.0 in | Wt 254.0 lb

## 2023-01-17 DIAGNOSIS — R350 Frequency of micturition: Secondary | ICD-10-CM

## 2023-01-17 DIAGNOSIS — E1122 Type 2 diabetes mellitus with diabetic chronic kidney disease: Secondary | ICD-10-CM

## 2023-01-17 DIAGNOSIS — E291 Testicular hypofunction: Secondary | ICD-10-CM | POA: Diagnosis not present

## 2023-01-17 DIAGNOSIS — Z5320 Procedure and treatment not carried out because of patient's decision for unspecified reasons: Secondary | ICD-10-CM

## 2023-01-17 DIAGNOSIS — R5383 Other fatigue: Secondary | ICD-10-CM

## 2023-01-17 DIAGNOSIS — R1314 Dysphagia, pharyngoesophageal phase: Secondary | ICD-10-CM | POA: Insufficient documentation

## 2023-01-17 DIAGNOSIS — N1831 Chronic kidney disease, stage 3a: Secondary | ICD-10-CM

## 2023-01-17 DIAGNOSIS — R634 Abnormal weight loss: Secondary | ICD-10-CM

## 2023-01-17 LAB — POCT URINALYSIS DIP (CLINITEK)
Bilirubin, UA: NEGATIVE
Blood, UA: NEGATIVE
Glucose, UA: >=1000 mg/dL — AB
Ketones, POC UA: NEGATIVE mg/dL
Leukocytes, UA: NEGATIVE
Nitrite, UA: NEGATIVE
POC PROTEIN,UA: NEGATIVE
Spec Grav, UA: 1.025 (ref 1.010–1.025)
Urobilinogen, UA: 0.2 E.U./dL
pH, UA: 6 (ref 5.0–8.0)

## 2023-01-17 MED ORDER — TRESIBA FLEXTOUCH 200 UNIT/ML ~~LOC~~ SOPN: PEN_INJECTOR | SUBCUTANEOUS | 3 refills | Status: DC

## 2023-01-17 NOTE — Progress Notes (Signed)
Established Patient Office Visit  Subjective:  Patient ID: Brent Duarte, male    DOB: 06-07-1956  Age: 67 y.o. MRN: [id number]  CC:  Chief Complaint  Patient presents with   Fatigue   Sinus Problem   Weight Loss    HPI Brent Duarte presents for follow up Pt was initially scheduled to come in for follow up to recheck bp after having low blood pressures - he had been advised to stop lisinopril but actually had restarted medication on his own and then had low readings with dizziness.  He states that since stopping the medication his bp is doing much better and brings log of his readings.  Since visit in March bp readings per his notes show ranging between 111-130/70-80 average  Pt states however he has been feeling very fatigued and not hardly able to eat since being seen at last visit.  He was placed on tresiba and has titrated his dosing over the past few weeks and is up to 60 units qd.  His glucose now over the past 2 weeks has been 70s-120s fasting and random 106-130s.  As far as he knows he has not had any hypoglycemia episodes - he has just been feeling general malaise 'all day every day' --- at last visit he was advised to stop Januvia because he had said it was too expensive, he has history of renal insufficiency and discussed we would try to manage sugars with insulin titration.  Pt states that he decided to continue this medication regardless --- is told today to stop.  At this point can continue with metformin and Jardiance as well as [place] but to decrease dose to 50 units qd since he is barely eating and glucose is starting to drop.  Pt states that he has no appetite.  He says that if he tries to eat too much he ends up having nausea and sometimes vomiting.  He had symptoms of trouble swallowing and feeling like food was sticking midway in his chest and even to the point he felt like his pills were 'hanging up' and trouble swallowing them.  He says those symptoms have completely  resolved however discussed the importance of seeing GI for further evaluation.  He adamantly refuses stating he had an evaluation years ago for similar symptoms and nothing was found.  Reiterated the need to evaluate further especially with his recent weight loss of almost 20 pounds in this past month.  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 and due for labwork as well - will check today  Past Medical History:  Diagnosis Date   Arthritis    BPH without obstruction/lower urinary tract symptoms 02/01/2020   History of chicken pox    History of kidney stones    History of measles    History of mumps    Hyperlipidemia    Idiopathic gout of right knee 02/01/2020   Imbalance    secondary to back issues   Multiple gastric ulcers    Numbness and tingling of both legs    Pneumonia    Psoriasis vulgaris 02/01/2020   Recurrent falls     Past Surgical History:  Procedure Laterality Date   bicep tear     right lower arm related to overgrowth of bone as stated per pt    KNEE ARTHROSCOPY     bilat   lithrotripsy     for kidney stones  LUMBAR LAMINECTOMY/DECOMPRESSION MICRODISCECTOMY N/A 09/07/2015   Procedure: MICRO LUMBAR DECOMPRESSION L4 - L5, REMOVAL OF SYNOVIAL CYST L4-L5;  Surgeon: Susa Day, MD;  Location: [facility];  Service: Orthopedics;  Laterality: N/A;   URETERAL STENT PLACEMENT     secondary to kidney stones/pt states had laser surgery prior     Family History  Problem Relation Age of Onset   Lung cancer Mother    Lung cancer Father    Diabetes Neg Hx     Social History   Socioeconomic History   Marital status: Married    Spouse name: Not on file   Number of children: Not on file   Years of education: Not on file   Highest education level: Not on file  Occupational History    Employer: [employer]   Tobacco Use   Smoking status: Never   Smokeless tobacco: Never  Substance and Sexual  Activity   Alcohol use: No   Drug use: No   Sexual activity: Yes    Partners: Female  Other Topics Concern   Not on file  Social History Narrative   Not on file   Social Determinants of Health   Financial Resource Strain: Not on file  Food Insecurity: Not on file  Transportation Needs: Not on file  Physical Activity: Not on file  Stress: Not on file  Social Connections: Not on file  Intimate Partner Violence: Not on file     Current Outpatient Medications:    acetaminophen (TYLENOL) 500 MG tablet, Tylenol Extra Strength, Disp: , Rfl:    allopurinol (ZYLOPRIM) 300 MG tablet, TAKE 1 TABLET BY MOUTH EVERY DAY, Disp: 90 tablet, Rfl: 1   aspirin EC 81 MG tablet, Take 81 mg by mouth daily. Swallow whole., Disp: , Rfl:    atorvastatin (LIPITOR) 40 MG tablet, TAKE 1 TABLET BY MOUTH EVERY DAY, Disp: 90 tablet, Rfl: 2   B-D 3CC LUER-LOK SYR 25GX1" 25G X 1" 3 ML MISC, USE 1 ML BY DOES NOT APPLY ROUTE EVERY 14 (FOURTEEN) DAYS. ( 1 AND 1/2 INCH ON BACKORDER), Disp: , Rfl:    colesevelam (WELCHOL) 625 MG tablet, TAKE 3 TABLETS (1,875 MG TOTAL) BY MOUTH DAILY., Disp: 270 tablet, Rfl: 0   empagliflozin (JARDIANCE) 25 MG TABS tablet, TAKE 1 TABLET BY MOUTH EVERY DAY BEFORE BREAKFAST, Disp: 90 tablet, Rfl: 1   finasteride (PROSCAR) 5 MG tablet, Take 5 mg by mouth daily., Disp: , Rfl:    fluticasone (FLONASE) 50 MCG/ACT nasal spray, Place 2 sprays into both nostrils daily., Disp: 16 g, Rfl: 6   gabapentin (NEURONTIN) 300 MG capsule, Take 300 mg by mouth. Take 6 tablets as needed, Disp: , Rfl:    glucose blood (ONETOUCH VERIO) test strip, USE TO TEST SUGAR DAILY, Disp: 100 strip, Rfl: 3   Insulin Pen Needle (NOVOFINE PEN NEEDLE) 32G X 6 MM MISC, To use with insulin qd, Disp: 100 each, Rfl: 3   metFORMIN (GLUCOPHAGE-XR) 500 MG 24 hr tablet, TAKE 2 TABLETS BY MOUTH EVERY DAY WITH BREAKFAST, Disp: 180 tablet, Rfl: 1   metoCLOPramide (REGLAN) 10 MG tablet, Take 1 tablet (10 mg total) by mouth 3 (three)  times daily before meals. For hiccups, Disp: 30 tablet, Rfl: 1   omega-3 acid ethyl esters (LOVAZA) 1 g capsule, Take 2 capsules (2 g total) by mouth 2 (two) times daily., Disp: 360 capsule, Rfl: 1   oxyCODONE-acetaminophen (PERCOCET) 10-325 MG tablet, Take 1 tablet by mouth every 4 (four) hours as needed  for pain., Disp: 60 tablet, Rfl: 0   tamsulosin (FLOMAX) 0.4 MG CAPS capsule, Take 0.4 mg by mouth daily., Disp: , Rfl:    dupilumab (DUPIXENT) 300 MG/2ML prefilled syringe, Inject 300 mg into the skin every 14 (fourteen) days. (Patient not taking: Reported on 12/25/2022), Disp: , Rfl:    insulin degludec (TRESIBA FLEXTOUCH) 200 UNIT/ML FlexTouch Pen, Inject 50 units qd, Disp: 3 mL, Rfl: 3  Current Facility-Administered Medications:    testosterone cypionate (DEPOTESTOSTERONE CYPIONATE) injection 200 mg, 200 mg, Intramuscular, Q30 days, Marge Duncans, PA-C, 200 mg at 01/10/23 [id number]   Allergies  Allergen Reactions   Bromocriptine Other (See Comments)    GI issues   Ciprofloxacin Other (See Comments)    "Tendon issues" Other reaction(s): other   Dulaglutide Other (See Comments) and Nausea Only    GI issues    ROS CONSTITUTIONAL: see HPI E/N/T: Negative for ear pain, nasal congestion and sore throat.  CARDIOVASCULAR: Negative for chest pain, dizziness, palpitations and pedal edema.  RESPIRATORY: Negative for recent cough and dyspnea.  GASTROINTESTINAL: see HPI MSK: Negative for arthralgias and myalgias.  INTEGUMENTARY: Negative for rash.  NEUROLOGICAL: Negative for dizziness and headaches.         Objective:    PHYSICAL EXAM:   VS: BP 110/62   Pulse 72   Temp (!) 96.3 F (35.7 C)   Resp 18   Ht 5\' 11"  (1.803 m)   Wt 254 lb (115.2 kg)   BMI 35.43 kg/m   GEN: Well nourished, well developed, in no acute distress  Cardiac: RRR; no murmurs, rubs, or gallops,no edema -  Respiratory:  normal respiratory rate and pattern with no distress - normal breath sounds with no rales,  rhonchi, wheezes or rubs GI: normal bowel sounds, no masses or tenderness MS: no deformity or atrophy  Skin: warm and dry, no rash  Psych: euthymic mood, appropriate affect and demeanor  Office Visit on 01/17/2023  Component Date Value Ref Range Status   Color, UA 01/17/2023 straw (A)  yellow Final   Clarity, UA 01/17/2023 clear  clear Final   Glucose, UA 01/17/2023 >=1,000 (A)  negative mg/dL Final   Bilirubin, UA 01/17/2023 negative  negative Final   Ketones, POC UA 01/17/2023 negative  negative mg/dL Final   Spec Grav, UA 01/17/2023 1.025  1.010 - 1.025 Final   Blood, UA 01/17/2023 negative  negative Final   pH, UA 01/17/2023 6.0  5.0 - 8.0 Final   POC PROTEIN,UA 01/17/2023 negative  negative, trace Final   Urobilinogen, UA 01/17/2023 0.2  0.2 or 1.0 E.U./dL Final   Nitrite, UA 01/17/2023 Negative  Negative Final   Leukocytes, UA 01/17/2023 Negative  Negative Final       Health Maintenance Due  Topic Date Due   Medicare Annual Wellness (AWV)  Never done   OPHTHALMOLOGY EXAM  08/25/2022   FOOT EXAM  12/20/2022    There are no preventive care reminders to display for this patient.  Lab Results  Component Value Date   TSH 2.110 12/06/2022   Lab Results  Component Value Date   WBC 6.3 12/06/2022   HGB 16.5 12/06/2022   HCT 49.3 12/06/2022   MCV 93 12/06/2022   PLT 136 (L) 12/06/2022   Lab Results  Component Value Date   NA 138 12/06/2022   K 4.9 12/06/2022   CO2 18 (L) 12/06/2022   GLUCOSE 154 (H) 12/06/2022   BUN 25 12/06/2022   CREATININE 1.15 12/06/2022   BILITOT 0.9  12/06/2022   ALKPHOS 113 12/06/2022   AST 13 12/06/2022   ALT 16 12/06/2022   PROT 6.6 12/06/2022   ALBUMIN 4.8 12/06/2022   CALCIUM 10.2 12/06/2022   ANIONGAP 7 08/31/2015   EGFR 70 12/06/2022   GFR 47.85 (L) 03/09/2022   Lab Results  Component Value Date   CHOL 124 12/06/2022   Lab Results  Component Value Date   HDL 35 (L) 12/06/2022   Lab Results  Component Value Date    LDLCALC 44 12/06/2022   Lab Results  Component Value Date   TRIG 292 (H) 12/06/2022   Lab Results  Component Value Date   CHOLHDL 3.5 12/06/2022   Lab Results  Component Value Date   HGBA1C 9.1 (H) 12/06/2022      Assessment & Plan:   Problem List Items Addressed This Visit       Endocrine   Testicular hypofunction - Primary   Relevant Orders   Testosterone,Free and Total   PSA   Type 2 diabetes mellitus with stage 3a chronic kidney disease, without long-term current use of insulin   Relevant Orders   CBC with Differential/Platelet   Comprehensive metabolic panel Decrease Tresiba to 50 units daily and continue to monitor Stop [place]   Other Visit Diagnoses     Frequency of micturition    - ua normal   Relevant Orders   PSA   POCT URINALYSIS DIP (CLINITEK) (Completed)  Dysphagia Pt refuses barium swallow Pt refuses GI referral  Weight loss Pt refuses GI referral Recheck in 3 weeks       Meds ordered this encounter  Medications   insulin degludec (TRESIBA FLEXTOUCH) 200 UNIT/ML FlexTouch Pen    Sig: Inject 50 units qd    Dispense:  3 mL    Refill:  3    Order Specific Question:   Supervising Provider    AnswerRochel Brome [id number]  Pt would like to have labwork done before considering any type of further workup of fatigue, weight loss and dysphagia  Follow-up: Return in about 3 weeks (around 02/07/2023) for follow up.    SARA R Lawrence Mitch, PA-C

## 2023-01-19 LAB — COMPREHENSIVE METABOLIC PANEL
ALT: 14 IU/L (ref 0–44)
AST: 14 IU/L (ref 0–40)
Albumin/Globulin Ratio: 2.1 (ref 1.2–2.2)
Albumin: 4.7 g/dL (ref 3.9–4.9)
Alkaline Phosphatase: 85 IU/L (ref 44–121)
BUN/Creatinine Ratio: 13 (ref 10–24)
BUN: 16 mg/dL (ref 8–27)
Bilirubin Total: 1 mg/dL (ref 0.0–1.2)
CO2: 21 mmol/L (ref 20–29)
Calcium: 9.8 mg/dL (ref 8.6–10.2)
Chloride: 102 mmol/L (ref 96–106)
Creatinine, Ser: 1.24 mg/dL (ref 0.76–1.27)
Globulin, Total: 2.2 g/dL (ref 1.5–4.5)
Glucose: 96 mg/dL (ref 70–99)
Potassium: 4.1 mmol/L (ref 3.5–5.2)
Sodium: 139 mmol/L (ref 134–144)
Total Protein: 6.9 g/dL (ref 6.0–8.5)
eGFR: 64 mL/min/{1.73_m2} (ref >59)

## 2023-01-19 LAB — CBC WITH DIFFERENTIAL/PLATELET
Basophils Absolute: 0 10*3/uL (ref 0.0–0.2)
Basos: 0 %
EOS (ABSOLUTE): 0.1 10*3/uL (ref 0.0–0.4)
Eos: 1 %
Hematocrit: 52.7 % — ABNORMAL HIGH (ref 37.5–51.0)
Hemoglobin: 17.3 g/dL (ref 13.0–17.7)
Immature Grans (Abs): 0 10*3/uL (ref 0.0–0.1)
Immature Granulocytes: 0 %
Lymphocytes Absolute: 1 10*3/uL (ref 0.7–3.1)
Lymphs: 19 %
MCH: 30.8 pg (ref 26.6–33.0)
MCHC: 32.8 g/dL (ref 31.5–35.7)
MCV: 94 fL (ref 79–97)
Monocytes Absolute: 0.5 10*3/uL (ref 0.1–0.9)
Monocytes: 10 %
Neutrophils Absolute: 3.8 10*3/uL (ref 1.4–7.0)
Neutrophils: 70 %
Platelets: 130 10*3/uL — ABNORMAL LOW (ref 150–450)
RBC: 5.62 x10E6/uL (ref 4.14–5.80)
RDW: 15.1 % (ref 11.6–15.4)
WBC: 5.4 10*3/uL (ref 3.4–10.8)

## 2023-01-19 LAB — PSA: Prostate Specific Ag, Serum: 3.5 ng/mL (ref 0.0–4.0)

## 2023-01-19 LAB — TESTOSTERONE,FREE AND TOTAL
Testosterone, Free: 13.3 pg/mL (ref 6.6–18.1)
Testosterone: 751 ng/dL (ref 264–916)

## 2023-01-22 ENCOUNTER — Other Ambulatory Visit: Payer: Self-pay | Admitting: Physician Assistant

## 2023-01-26 ENCOUNTER — Other Ambulatory Visit: Payer: Self-pay | Admitting: Family Medicine

## 2023-01-29 LAB — HM DIABETES EYE EXAM

## 2023-02-04 ENCOUNTER — Encounter: Payer: Self-pay | Admitting: Physician Assistant

## 2023-02-07 ENCOUNTER — Encounter: Payer: Self-pay | Admitting: Physician Assistant

## 2023-02-07 ENCOUNTER — Ambulatory Visit (INDEPENDENT_AMBULATORY_CARE_PROVIDER_SITE_OTHER): Payer: Medicare Other | Admitting: Physician Assistant

## 2023-02-07 VITALS — BP 108/66 | HR 85 | Temp 96.9°F | Ht 71.0 in | Wt 254.0 lb

## 2023-02-07 DIAGNOSIS — N1831 Chronic kidney disease, stage 3a: Secondary | ICD-10-CM

## 2023-02-07 DIAGNOSIS — E1122 Type 2 diabetes mellitus with diabetic chronic kidney disease: Secondary | ICD-10-CM | POA: Diagnosis not present

## 2023-02-07 DIAGNOSIS — R899 Unspecified abnormal finding in specimens from other organs, systems and tissues: Secondary | ICD-10-CM | POA: Diagnosis not present

## 2023-02-07 LAB — COMPREHENSIVE METABOLIC PANEL
ALT: 25 IU/L (ref 0–44)
AST: 16 IU/L (ref 0–40)
Albumin/Globulin Ratio: 2.3 — ABNORMAL HIGH (ref 1.2–2.2)
Albumin: 4.2 g/dL (ref 3.9–4.9)
Alkaline Phosphatase: 100 IU/L (ref 44–121)
BUN/Creatinine Ratio: 13 (ref 10–24)
BUN: 14 mg/dL (ref 8–27)
Bilirubin Total: 0.7 mg/dL (ref 0.0–1.2)
CO2: 19 mmol/L — ABNORMAL LOW (ref 20–29)
Calcium: 9.3 mg/dL (ref 8.6–10.2)
Chloride: 105 mmol/L (ref 96–106)
Creatinine, Ser: 1.09 mg/dL (ref 0.76–1.27)
Globulin, Total: 1.8 g/dL (ref 1.5–4.5)
Glucose: 185 mg/dL — ABNORMAL HIGH (ref 70–99)
Potassium: 4.1 mmol/L (ref 3.5–5.2)
Sodium: 142 mmol/L (ref 134–144)
Total Protein: 6 g/dL (ref 6.0–8.5)
eGFR: 74 mL/min/{1.73_m2} (ref >59)

## 2023-02-07 LAB — CBC WITH DIFFERENTIAL/PLATELET
Basophils Absolute: 0 10*3/uL (ref 0.0–0.2)
Basos: 0 %
EOS (ABSOLUTE): 0.1 10*3/uL (ref 0.0–0.4)
Eos: 2 %
Hematocrit: 48.6 % (ref 37.5–51.0)
Hemoglobin: 16.3 g/dL (ref 13.0–17.7)
Immature Grans (Abs): 0 10*3/uL (ref 0.0–0.1)
Immature Granulocytes: 1 %
Lymphocytes Absolute: 1 10*3/uL (ref 0.7–3.1)
Lymphs: 23 %
MCH: 31.2 pg (ref 26.6–33.0)
MCHC: 33.5 g/dL (ref 31.5–35.7)
MCV: 93 fL (ref 79–97)
Monocytes Absolute: 0.3 10*3/uL (ref 0.1–0.9)
Monocytes: 6 %
Neutrophils Absolute: 2.9 10*3/uL (ref 1.4–7.0)
Neutrophils: 68 %
Platelets: 137 10*3/uL — ABNORMAL LOW (ref 150–450)
RBC: 5.22 x10E6/uL (ref 4.14–5.80)
RDW: 15.4 % (ref 11.6–15.4)
WBC: 4.3 10*3/uL (ref 3.4–10.8)

## 2023-02-07 MED ORDER — TRESIBA FLEXTOUCH 200 UNIT/ML ~~LOC~~ SOPN: 40.0000 [IU] | PEN_INJECTOR | Freq: Every day | SUBCUTANEOUS | 2 refills | Status: DC

## 2023-02-07 NOTE — Progress Notes (Signed)
Established Patient Office Visit  Subjective:  Patient ID: Brent Duarte, male    DOB: 28-Mar-1956  Age: 67 y.o. MRN: [id number]  CC:  Chief Complaint  Patient presents with   Diabetes    HPI Brent Duarte presents for follow up of diabetes Pt states since stopping Jardiance he is feeling better and is able to eat better.  He denies any trouble swallowing now.  He had refused GI referral at last visit - had lost 20 pounds in a few months but weight is stable since last visit.  He has decreased his tresiba to 40 units daily and brings log of glucose readings which are averaging 115-130.  He states he feels his energy level is returning and denies any other issues or problems today  Pt due to recheck CBC - platelets were low at last visit and with complaints of malaise, weight loss he was recommended to see hematology which he refused.  He states he has had issues with platelets being low in the past and agreeable to recheck labwork today  Pt denies chest pain/sob/edema.  Denies abdominal pain or change in bowels.  Denies further dizziness.  As stated - no complaints today  Past Medical History:  Diagnosis Date   Arthritis    BPH without obstruction/lower urinary tract symptoms 02/01/2020   History of chicken pox    History of kidney stones    History of measles    History of mumps    Hyperlipidemia    Idiopathic gout of right knee 02/01/2020   Imbalance    secondary to back issues   Multiple gastric ulcers    Numbness and tingling of both legs    Pneumonia    Psoriasis vulgaris 02/01/2020   Recurrent falls     Past Surgical History:  Procedure Laterality Date   bicep tear     right lower arm related to overgrowth of bone as stated per pt    KNEE ARTHROSCOPY     bilat   lithrotripsy     for kidney stones   LUMBAR LAMINECTOMY/DECOMPRESSION MICRODISCECTOMY N/A 09/07/2015   Procedure: MICRO LUMBAR DECOMPRESSION L4 - L5, REMOVAL OF SYNOVIAL CYST L4-L5;  Surgeon: Jene Every,  MD;  Location: [facility];  Service: Orthopedics;  Laterality: N/A;   URETERAL STENT PLACEMENT     secondary to kidney stones/pt states had laser surgery prior     Family History  Problem Relation Age of Onset   Lung cancer Mother    Lung cancer Father    Diabetes Neg Hx     Social History   Socioeconomic History   Marital status: Married    Spouse name: Not on file   Number of children: Not on file   Years of education: Not on file   Highest education level: Not on file  Occupational History    Employer: [employer]   Tobacco Use   Smoking status: Never   Smokeless tobacco: Never  Substance and Sexual Activity   Alcohol use: No   Drug use: No   Sexual activity: Yes    Partners: Female  Other Topics Concern   Not on file  Social History Narrative   Not on file   Social Determinants of Health   Financial Resource Strain: Not on file  Food Insecurity: Not on file  Transportation Needs: Not on file  Physical Activity: Not on file  Stress: Not on file  Social Connections: Not on file  Intimate Partner Violence:  Not on file     Current Outpatient Medications:    acetaminophen (TYLENOL) 500 MG tablet, Tylenol Extra Strength, Disp: , Rfl:    allopurinol (ZYLOPRIM) 300 MG tablet, TAKE 1 TABLET BY MOUTH EVERY DAY, Disp: 90 tablet, Rfl: 1   aspirin EC 81 MG tablet, Take 81 mg by mouth daily. Swallow whole., Disp: , Rfl:    atorvastatin (LIPITOR) 40 MG tablet, TAKE 1 TABLET BY MOUTH EVERY DAY, Disp: 90 tablet, Rfl: 2   B-D 3CC LUER-LOK SYR 25GX1" 25G X 1" 3 ML MISC, USE 1 ML BY DOES NOT APPLY ROUTE EVERY 14 (FOURTEEN) DAYS. ( 1 AND 1/2 INCH ON BACKORDER), Disp: , Rfl:    colesevelam (WELCHOL) 625 MG tablet, TAKE 3 TABLETS (1,875 MG TOTAL) BY MOUTH DAILY., Disp: 270 tablet, Rfl: 0   empagliflozin (JARDIANCE) 25 MG TABS tablet, TAKE 1 TABLET BY MOUTH EVERY DAY BEFORE BREAKFAST, Disp: 90 tablet, Rfl: 1   finasteride (PROSCAR) 5 MG tablet, Take 5 mg by mouth daily., Disp: ,  Rfl:    fluticasone (FLONASE) 50 MCG/ACT nasal spray, Place 2 sprays into both nostrils daily., Disp: 16 g, Rfl: 6   gabapentin (NEURONTIN) 300 MG capsule, Take 300 mg by mouth. Take 6 tablets as needed, Disp: , Rfl:    glucose blood (ONETOUCH VERIO) test strip, USE TO TEST SUGAR DAILY, Disp: 100 strip, Rfl: 3   insulin degludec (TRESIBA FLEXTOUCH) 200 UNIT/ML FlexTouch Pen, Inject 40 Units into the skin daily., Disp: 3 mL, Rfl: 2   Insulin Pen Needle (NOVOFINE PEN NEEDLE) 32G X 6 MM MISC, To use with insulin qd, Disp: 100 each, Rfl: 3   metFORMIN (GLUCOPHAGE-XR) 500 MG 24 hr tablet, TAKE 2 TABLETS BY MOUTH EVERY DAY WITH BREAKFAST, Disp: 180 tablet, Rfl: 1   metoCLOPramide (REGLAN) 10 MG tablet, Take 1 tablet (10 mg total) by mouth 3 (three) times daily before meals. For hiccups, Disp: 30 tablet, Rfl: 1   omega-3 acid ethyl esters (LOVAZA) 1 g capsule, Take 2 capsules (2 g total) by mouth 2 (two) times daily., Disp: 360 capsule, Rfl: 1   oxyCODONE-acetaminophen (PERCOCET) 10-325 MG tablet, Take 1 tablet by mouth every 4 (four) hours as needed for pain., Disp: 60 tablet, Rfl: 0   tamsulosin (FLOMAX) 0.4 MG CAPS capsule, Take 0.4 mg by mouth daily., Disp: , Rfl:    dupilumab (DUPIXENT) 300 MG/2ML prefilled syringe, Inject 300 mg into the skin every 14 (fourteen) days. (Patient not taking: Reported on 02/07/2023), Disp: , Rfl:   Current Facility-Administered Medications:    testosterone cypionate (DEPOTESTOSTERONE CYPIONATE) injection 200 mg, 200 mg, Intramuscular, Q30 days, Marianne Sofia, PA-C, 200 mg at 01/10/23 1027   Allergies  Allergen Reactions   Bromocriptine Other (See Comments)    GI issues   Ciprofloxacin Other (See Comments)    "Tendon issues" Other reaction(s): other   Dulaglutide Other (See Comments) and Nausea Only    GI issues    ROS CONSTITUTIONAL: Negative for chills, fatigue, fever, unintentional weight gain and unintentional weight loss.  E/N/T: Negative for ear pain,  nasal congestion and sore throat.  CARDIOVASCULAR: Negative for chest pain, dizziness, palpitations and pedal edema.  RESPIRATORY: Negative for recent cough and dyspnea.  GASTROINTESTINAL: Negative for abdominal pain, acid reflux symptoms, constipation, diarrhea, nausea and vomiting.  MSK: Negative for arthralgias and myalgias.  INTEGUMENTARY: Negative for rash.  NEUROLOGICAL: Negative for dizziness and headaches.  PSYCHIATRIC: Negative for sleep disturbance and to question depression screen.  Negative for depression,  negative for anhedonia.        Objective:   PHYSICAL EXAM:   VS: BP 108/66 (BP Location: Left Arm, Patient Position: Sitting, Cuff Size: Large)   Pulse 85   Temp (!) 96.9 F (36.1 C) (Temporal)   Ht  (1.803 m)   Wt 254 lb (115.2 kg)   SpO2 99%   BMI 35.43 kg/m   GEN: Well nourished, well developed, in no acute distress  Cardiac: RRR; no murmurs, rubs, or gallops,no edema -  Respiratory:  normal respiratory rate and pattern with no distress - normal breath sounds with no rales, rhonchi, wheezes or rubs  Skin: warm and dry, no rash  Neuro:  Alert and Oriented x 3, - CN II-Xii grossly intact Psych: euthymic mood, appropriate affect and demeanor   Health Maintenance Due  Topic Date Due   Medicare Annual Wellness (AWV)  Never done    There are no preventive care reminders to display for this patient.  Lab Results  Component Value Date   TSH 2.110 12/06/2022   Lab Results  Component Value Date   WBC 5.4 01/17/2023   HGB 17.3 01/17/2023   HCT 52.7 (H) 01/17/2023   MCV 94 01/17/2023   PLT 130 (L) 01/17/2023   Lab Results  Component Value Date   NA 139 01/17/2023   K 4.1 01/17/2023   CO2 21 01/17/2023   GLUCOSE 96 01/17/2023   BUN 16 01/17/2023   CREATININE 1.24 01/17/2023   BILITOT 1.0 01/17/2023   ALKPHOS 85 01/17/2023   AST 14 01/17/2023   ALT 14 01/17/2023   PROT 6.9 01/17/2023   ALBUMIN 4.7 01/17/2023   CALCIUM 9.8 01/17/2023    ANIONGAP 7 08/31/2015   EGFR 64 01/17/2023   GFR 47.85 (L) 03/09/2022   Lab Results  Component Value Date   CHOL 124 12/06/2022   Lab Results  Component Value Date   HDL 35 (L) 12/06/2022   Lab Results  Component Value Date   LDLCALC 44 12/06/2022   Lab Results  Component Value Date   TRIG 292 (H) 12/06/2022   Lab Results  Component Value Date   CHOLHDL 3.5 12/06/2022   Lab Results  Component Value Date   HGBA1C 9.1 (H) 12/06/2022      Assessment & Plan:   Problem List Items Addressed This Visit       Endocrine   Type 2 diabetes mellitus with stage 3a chronic kidney disease, without long-term current use of insulin - Primary   Relevant Medications   insulin degludec (TRESIBA FLEXTOUCH) 200 UNIT/ML FlexTouch Pen Continue current meds as directed Return in 2 months for fasting labs     Other   Abnormal laboratory test   Relevant Orders   CBC with Differential/Platelet   Comprehensive metabolic panel   PT TO FOLLOW UP SOONER IF HE BEGINS HAVING ANY OTHER SYMPTOMS - OR IF NOTES TROUBLE WITH SWALLOWING/DECREASED APPETITE AGAIN Meds ordered this encounter  Medications   insulin degludec (TRESIBA FLEXTOUCH) 200 UNIT/ML FlexTouch Pen    Sig: Inject 40 Units into the skin daily.    Dispense:  3 mL    Refill:  2    Order Specific Question:   Supervising Provider    Answer:   Corey Harold    Follow-up: Return in about 2 months (around 04/09/2023).    SARA R Nabil Bubolz, PA-C

## 2023-03-13 ENCOUNTER — Ambulatory Visit (INDEPENDENT_AMBULATORY_CARE_PROVIDER_SITE_OTHER): Payer: Medicare Other

## 2023-03-13 DIAGNOSIS — E291 Testicular hypofunction: Secondary | ICD-10-CM | POA: Diagnosis not present

## 2023-03-13 NOTE — Progress Notes (Signed)
      Patient: Brent Duarte  DOB: 1956/01/11  MRN: [id number]    Visit Date: 03/13/2023    Brent Duarte presents today for his testosterone injection.  Patient tolerated the injection well and has no questions.    Administrations This Visit     testosterone cypionate (DEPOTESTOSTERONE CYPIONATE) injection 200 mg     Admin Date 03/13/2023 Action Given Dose 200 mg Route Intramuscular Administered By [name], LPN             [name], LPN  40/98/11 9:14 AM

## 2023-03-21 ENCOUNTER — Ambulatory Visit (INDEPENDENT_AMBULATORY_CARE_PROVIDER_SITE_OTHER): Payer: Medicare Other | Admitting: Physician Assistant

## 2023-03-21 ENCOUNTER — Encounter: Payer: Self-pay | Admitting: Physician Assistant

## 2023-03-21 VITALS — BP 124/84 | HR 85 | Temp 98.1°F | Resp 14 | Ht 71.0 in | Wt 259.0 lb

## 2023-03-21 DIAGNOSIS — Z0000 Encounter for general adult medical examination without abnormal findings: Secondary | ICD-10-CM | POA: Diagnosis not present

## 2023-03-21 NOTE — Progress Notes (Signed)
Subjective:   Brent Duarte is a 67 y.o. male who presents for a Welcome to Medicare exam.   Review of Systems: Review of Systems  Constitutional:  Negative for chills and fever.  HENT:  Negative for congestion.   Eyes:  Negative for blurred vision.  Respiratory:  Negative for cough and shortness of breath.   Cardiovascular:  Negative for chest pain and palpitations.  Gastrointestinal:  Negative for abdominal pain, heartburn, nausea and vomiting.  Genitourinary:  Negative for dysuria.  Neurological:  Negative for dizziness and headaches.    Cardiac Risk Factors include: diabetes mellitus;advanced age (>27men, >27 women);male gender;obesity (BMI >30kg/m2)     Objective:    Today's Vitals   03/21/23 1333 03/21/23 1346  BP: 124/84   Pulse: 85   Resp: 14   Temp: 98.1 F (36.7 C)   SpO2: 96%   Weight: 259 lb (117.5 kg)   Height: 5\' 11"  (1.803 m)   PainSc:  4    Body mass index is 36.12 kg/m.  Medications Outpatient Encounter Medications as of 03/21/2023  Medication Sig   acetaminophen (TYLENOL) 500 MG tablet Tylenol Extra Strength   allopurinol (ZYLOPRIM) 300 MG tablet TAKE 1 TABLET BY MOUTH EVERY DAY   aspirin EC 81 MG tablet Take 81 mg by mouth daily. Swallow whole.   atorvastatin (LIPITOR) 40 MG tablet TAKE 1 TABLET BY MOUTH EVERY DAY   B-D 3CC LUER-LOK SYR 25GX1" 25G X 1" 3 ML MISC USE 1 ML BY DOES NOT APPLY ROUTE EVERY 14 (FOURTEEN) DAYS. ( 1 AND 1/2 INCH ON BACKORDER)   colesevelam (WELCHOL) 625 MG tablet TAKE 3 TABLETS (1,875 MG TOTAL) BY MOUTH DAILY.   dupilumab (DUPIXENT) 300 MG/2ML prefilled syringe Inject 300 mg into the skin every 14 (fourteen) days.   empagliflozin (JARDIANCE) 25 MG TABS tablet TAKE 1 TABLET BY MOUTH EVERY DAY BEFORE BREAKFAST   finasteride (PROSCAR) 5 MG tablet Take 5 mg by mouth daily.   fluticasone (FLONASE) 50 MCG/ACT nasal spray Place 2 sprays into both nostrils daily.   gabapentin (NEURONTIN) 300 MG capsule Take 300 mg by mouth. Take 6  tablets as needed   glucose blood (ONETOUCH VERIO) test strip USE TO TEST SUGAR DAILY   insulin degludec (TRESIBA FLEXTOUCH) 200 UNIT/ML FlexTouch Pen Inject 40 Units into the skin daily.   Insulin Pen Needle (NOVOFINE PEN NEEDLE) 32G X 6 MM MISC To use with insulin qd   metFORMIN (GLUCOPHAGE-XR) 500 MG 24 hr tablet TAKE 2 TABLETS BY MOUTH EVERY DAY WITH BREAKFAST   metoCLOPramide (REGLAN) 10 MG tablet Take 1 tablet (10 mg total) by mouth 3 (three) times daily before meals. For hiccups   omega-3 acid ethyl esters (LOVAZA) 1 g capsule Take 2 capsules (2 g total) by mouth 2 (two) times daily.   oxyCODONE-acetaminophen (PERCOCET) 10-325 MG tablet Take 1 tablet by mouth every 4 (four) hours as needed for pain.   tamsulosin (FLOMAX) 0.4 MG CAPS capsule Take 0.4 mg by mouth daily.   Facility-Administered Encounter Medications as of 03/21/2023  Medication   testosterone cypionate (DEPOTESTOSTERONE CYPIONATE) injection 200 mg     History: Past Medical History:  Diagnosis Date   Arthritis    BPH without obstruction/lower urinary tract symptoms 02/01/2020   History of chicken pox    History of kidney stones    History of measles    History of mumps    Hyperlipidemia    Idiopathic gout of right knee 02/01/2020   Imbalance  secondary to back issues   Multiple gastric ulcers    Numbness and tingling of both legs    Pneumonia    Psoriasis vulgaris 02/01/2020   Recurrent falls    Past Surgical History:  Procedure Laterality Date   bicep tear     right lower arm related to overgrowth of bone as stated per pt    KNEE ARTHROSCOPY     bilat   lithrotripsy     for kidney stones   LUMBAR LAMINECTOMY/DECOMPRESSION MICRODISCECTOMY N/A 09/07/2015   Procedure: MICRO LUMBAR DECOMPRESSION L4 - L5, REMOVAL OF SYNOVIAL CYST L4-L5;  Surgeon: Jene Every, MD;  Location: [facility];  Service: Orthopedics;  Laterality: N/A;   URETERAL STENT PLACEMENT     secondary to kidney stones/pt states had laser surgery  prior     Family History  Problem Relation Age of Onset   Lung cancer Mother    Lung cancer Father    Diabetes Neg Hx    Social History   Occupational History    Employer: [employer]   Tobacco Use   Smoking status: Never   Smokeless tobacco: Never  Substance and Sexual Activity   Alcohol use: No   Drug use: No   Sexual activity: Yes    Partners: Female   Tobacco Counseling Counseling given: Not Answered   Immunizations and Health Maintenance Immunization History  Administered Date(s) Administered   COVID-19, mRNA, vaccine(Comirnaty)12 years and older 08/03/2022   Fluad Quad(high Dose 65+) 07/17/2021, 08/03/2022   Influenza Inj Mdck Quad Pf 07/27/2020   PFIZER(Purple Top)SARS-COV-2 Vaccination 02/29/2020, 03/21/2020, 10/05/2020   Pfizer Covid-19 Vaccine Bivalent Booster 25yrs & up 07/17/2021   Tdap 08/16/2021   There are no preventive care reminders to display for this patient.   Activities of Daily Living    03/21/2023    1:43 PM  In your present state of health, do you have any difficulty performing the following activities:  Hearing? 0  Vision? 0  Difficulty concentrating or making decisions? 0  Walking or climbing stairs? 0  Dressing or bathing? 0  Doing errands, shopping? 0  Preparing Food and eating ? N  Using the Toilet? N  In the past six months, have you accidently leaked urine? N  Do you have problems with loss of bowel control? N  Managing your Medications? N  Managing your Finances? N  Housekeeping or managing your Housekeeping? N    Physical Exam  PHYSICAL EXAM:   VS: BP 124/84   Pulse 85   Temp 98.1 F (36.7 C)   Resp 14   Ht 5\' 11"  (1.803 m)   Wt 259 lb (117.5 kg)   SpO2 96%   BMI 36.12 kg/m   GEN: Well nourished, well developed, in no acute distress   Cardiac: RRR; no murmurs, rubs, or gallops,no edema - Respiratory:  normal respiratory rate and pattern with no distress - normal breath sounds with no rales, rhonchi, wheezes  or rubs MS: no deformity or atrophy  Skin: warm and dry, no rash  Neuro:  Alert and Oriented x 3, Strength and sensation are intact - CN II-Xii grossly intact Psych: euthymic mood, appropriate affect and demeanor .  Advanced Directives: Does Patient Have a Medical Advance Directive?: No Would patient like information on creating a medical advance directive?: No - Patient declined    Assessment:    This is a routine wellness  examination for this patient .    Vision/Hearing screen No results found.  Dietary  issues and exercise activities discussed:  Current Exercise Habits: The patient does not participate in regular exercise at present, Exercise limited by: None identified   Goals   None     Depression Screen    03/21/2023    1:43 PM 02/07/2023    8:33 AM 10/04/2022   10:39 AM 05/03/2022   10:37 AM  PHQ 2/9 Scores  PHQ - 2 Score 0 0 0 0  PHQ- 9 Score 0 1 3 0     Fall Risk    03/21/2023    1:42 PM  Fall Risk   Falls in the past year? 0  Number falls in past yr: 0  Injury with Fall? 0  Risk for fall due to : No Fall Risks  Follow up Falls prevention discussed    Cognitive Function        03/21/2023    1:44 PM  6CIT Screen  What Year? 0 points  What month? 0 points  What time? 0 points  Count back from 20 0 points  Months in reverse 0 points  Repeat phrase 2 points  Total Score 2 points    Patient Care Team: Marianne Sofia, Cordelia Poche as PCP - General (Physician Assistant)     Plan:   Continue current care Follow up fasting at next scheduled visit  I have personally reviewed and noted the following in the patient's chart:   Medical and social history Use of alcohol, tobacco or illicit drugs  Current medications and supplements Functional ability and status Nutritional status Physical activity Advanced directives List of other physicians Hospitalizations, surgeries, and ER visits in previous 12 months Vitals Screenings to include cognitive, depression,  and falls Referrals and appointments  In addition, I have reviewed and discussed with patient certain preventive protocols, quality metrics, and best practice recommendations. A written personalized care plan for preventive services as well as general preventive health recommendations were provided to patient.    Eugenie Norrie, CMA 03/21/2023  I attest that I have reviewed this visit and agree with the plan scribed by my staff.   Jettie Pagan [facility] [phone]

## 2023-03-21 NOTE — Patient Instructions (Signed)
Health Maintenance, Male Adopting a healthy lifestyle and getting preventive care are important in promoting health and wellness. Ask your health care provider about: The right schedule for you to have regular tests and exams. Things you can do on your own to prevent diseases and keep yourself healthy. What should I know about diet, weight, and exercise? Eat a healthy diet  Eat a diet that includes plenty of vegetables, fruits, low-fat dairy products, and lean protein. Do not eat a lot of foods that are high in solid fats, added sugars, or sodium. Maintain a healthy weight Body mass index (BMI) is a measurement that can be used to identify possible weight problems. It estimates body fat based on height and weight. Your health care provider can help determine your BMI and help you achieve or maintain a healthy weight. Get regular exercise Get regular exercise. This is one of the most important things you can do for your health. Most adults should: Exercise for at least 150 minutes each week. The exercise should increase your heart rate and make you sweat (moderate-intensity exercise). Do strengthening exercises at least twice a week. This is in addition to the moderate-intensity exercise. Spend less time sitting. Even light physical activity can be beneficial. Watch cholesterol and blood lipids Have your blood tested for lipids and cholesterol at 68 years of age, then have this test every 5 years. You may need to have your cholesterol levels checked more often if: Your lipid or cholesterol levels are high. You are older than 68 years of age. You are at high risk for heart disease. What should I know about cancer screening? Many types of cancers can be detected early and may often be prevented. Depending on your health history and family history, you may need to have cancer screening at various ages. This may include screening for: Colorectal cancer. Prostate cancer. Skin cancer. Lung  cancer. What should I know about heart disease, diabetes, and high blood pressure? Blood pressure and heart disease High blood pressure causes heart disease and increases the risk of stroke. This is more likely to develop in people who have high blood pressure readings or are overweight. Talk with your health care provider about your target blood pressure readings. Have your blood pressure checked: Every 3-5 years if you are 18-39 years of age. Every year if you are 40 years old or older. If you are between the ages of 65 and 75 and are a current or former smoker, ask your health care provider if you should have a one-time screening for abdominal aortic aneurysm (AAA). Diabetes Have regular diabetes screenings. This checks your fasting blood sugar level. Have the screening done: Once every three years after age 45 if you are at a normal weight and have a low risk for diabetes. More often and at a younger age if you are overweight or have a high risk for diabetes. What should I know about preventing infection? Hepatitis B If you have a higher risk for hepatitis B, you should be screened for this virus. Talk with your health care provider to find out if you are at risk for hepatitis B infection. Hepatitis C Blood testing is recommended for: Everyone born from 1945 through 1965. Anyone with known risk factors for hepatitis C. Sexually transmitted infections (STIs) You should be screened each year for STIs, including gonorrhea and chlamydia, if: You are sexually active and are younger than 68 years of age. You are older than 68 years of age and your   health care provider tells you that you are at risk for this type of infection. Your sexual activity has changed since you were last screened, and you are at increased risk for chlamydia or gonorrhea. Ask your health care provider if you are at risk. Ask your health care provider about whether you are at high risk for HIV. Your health care provider  may recommend a prescription medicine to help prevent HIV infection. If you choose to take medicine to prevent HIV, you should first get tested for HIV. You should then be tested every 3 months for as long as you are taking the medicine. Follow these instructions at home: Alcohol use Do not drink alcohol if your health care provider tells you not to drink. If you drink alcohol: Limit how much you have to 0-2 drinks a day. Know how much alcohol is in your drink. In the [place] one drink equals one 12 oz bottle of beer (355 mL), one 5 oz glass of wine (148 mL), or one 1 oz glass of hard liquor (44 mL). Lifestyle Do not use any products that contain nicotine or tobacco. These products include cigarettes, chewing tobacco, and vaping devices, such as e-cigarettes. If you need help quitting, ask your health care provider. Do not use street drugs. Do not share needles. Ask your health care provider for help if you need support or information about quitting drugs. General instructions Schedule regular health, dental, and eye exams. Stay current with your vaccines. Tell your health care provider if: You often feel depressed. You have ever been abused or do not feel safe at home. Summary Adopting a healthy lifestyle and getting preventive care are important in promoting health and wellness. Follow your health care provider's instructions about healthy diet, exercising, and getting tested or screened for diseases. Follow your health care provider's instructions on monitoring your cholesterol and blood pressure. This information is not intended to replace advice given to you by your health care provider. Make sure you discuss any questions you have with your health care provider. Document Revised: 02/20/2021 Document Reviewed: 02/20/2021 Elsevier Patient Education  2024 [employer]  

## 2023-04-12 ENCOUNTER — Ambulatory Visit (INDEPENDENT_AMBULATORY_CARE_PROVIDER_SITE_OTHER): Payer: Medicare Other

## 2023-04-12 DIAGNOSIS — E291 Testicular hypofunction: Secondary | ICD-10-CM

## 2023-04-12 LAB — LAB REPORT - SCANNED
Creatinine, POC: 83.7 mg/dL
EGFR: 70

## 2023-04-12 NOTE — Progress Notes (Signed)
      Patient: Brent Duarte  DOB: 12-25-1955  MRN: [id number]    Visit Date: 04/12/2023    [name] presents today for his testosterone injection.  Patient tolerated the injection well and has no questions.    Administrations This Visit     testosterone cypionate (DEPOTESTOSTERONE CYPIONATE) injection 200 mg     Admin Date 04/12/2023 Action Given Dose 200 mg Route Intramuscular Administered By [name], LPN             [name], LPN  40/98/11 9:14 AM

## 2023-05-05 ENCOUNTER — Other Ambulatory Visit: Payer: Self-pay | Admitting: Physician Assistant

## 2023-05-05 DIAGNOSIS — E1122 Type 2 diabetes mellitus with diabetic chronic kidney disease: Secondary | ICD-10-CM

## 2023-05-05 DIAGNOSIS — N1831 Chronic kidney disease, stage 3a: Secondary | ICD-10-CM

## 2023-05-06 ENCOUNTER — Other Ambulatory Visit: Payer: Self-pay

## 2023-05-07 ENCOUNTER — Other Ambulatory Visit: Payer: Self-pay

## 2023-05-07 MED ORDER — ATORVASTATIN CALCIUM 40 MG PO TABS: 40.0000 mg | ORAL_TABLET | Freq: Every day | ORAL | 2 refills | Status: DC

## 2023-05-10 ENCOUNTER — Other Ambulatory Visit: Payer: Self-pay | Admitting: Physician Assistant

## 2023-05-13 ENCOUNTER — Ambulatory Visit (INDEPENDENT_AMBULATORY_CARE_PROVIDER_SITE_OTHER): Payer: Medicare Other

## 2023-05-13 DIAGNOSIS — E291 Testicular hypofunction: Secondary | ICD-10-CM

## 2023-05-13 NOTE — Progress Notes (Addendum)
Patient is here for testosterone shot. He tolerated testosterone shot well.

## 2023-05-17 ENCOUNTER — Other Ambulatory Visit: Payer: Self-pay | Admitting: Family Medicine

## 2023-05-17 DIAGNOSIS — E782 Mixed hyperlipidemia: Secondary | ICD-10-CM

## 2023-05-20 ENCOUNTER — Encounter: Payer: Self-pay | Admitting: Physician Assistant

## 2023-05-20 ENCOUNTER — Ambulatory Visit (INDEPENDENT_AMBULATORY_CARE_PROVIDER_SITE_OTHER): Payer: Medicare Other | Admitting: Physician Assistant

## 2023-05-20 VITALS — BP 128/64 | HR 97 | Temp 97.2°F | Ht 71.0 in | Wt 258.2 lb

## 2023-05-20 DIAGNOSIS — E1122 Type 2 diabetes mellitus with diabetic chronic kidney disease: Secondary | ICD-10-CM | POA: Diagnosis not present

## 2023-05-20 DIAGNOSIS — I10 Essential (primary) hypertension: Secondary | ICD-10-CM | POA: Diagnosis not present

## 2023-05-20 DIAGNOSIS — M4300 Spondylolysis, site unspecified: Secondary | ICD-10-CM

## 2023-05-20 DIAGNOSIS — E782 Mixed hyperlipidemia: Secondary | ICD-10-CM | POA: Diagnosis not present

## 2023-05-20 DIAGNOSIS — L400 Psoriasis vulgaris: Secondary | ICD-10-CM

## 2023-05-20 DIAGNOSIS — N1831 Chronic kidney disease, stage 3a: Secondary | ICD-10-CM

## 2023-05-20 DIAGNOSIS — N400 Enlarged prostate without lower urinary tract symptoms: Secondary | ICD-10-CM

## 2023-05-20 MED ORDER — TRESIBA FLEXTOUCH 200 UNIT/ML ~~LOC~~ SOPN
44.0000 [IU] | PEN_INJECTOR | Freq: Every day | SUBCUTANEOUS | Status: DC
Start: 2023-05-20 — End: 2023-08-26

## 2023-05-20 NOTE — Progress Notes (Signed)
Established Patient Office Visit  Subjective:  Patient ID: Brent Duarte, male    DOB: 08-12-1956  Age: 67 y.o. MRN: [id number]  CC:  Chief Complaint  Patient presents with   Medical Management of Chronic Issues    HPI Brent Duarte presents for follow up of diabetes Pt states overall he is doing better with glucose and brings log that shows most fasting glucose under 180 and mid afternoons ranging 150s-210s.  He is currently taking jardiance 25mg  qd, tresiba 44 units daily, and metformin 500mg  bid He is on a statin  - We have discussed starting GLP1 pending his labwork and pt agreeable Pt is up to date on eye exam  Pt has history of BPH and currently following with urology - his next appt is in September.  He states that he has been advised to have another biopsy because his 'levels are high" (assume PSA) but pt is refusing to have done  He is on proscar and has also started flomax  Pt follows with pain management for chronic low back pain with Dr Ethelene Hal - he was last seen 2 weeks ago and was advised at this time to continue his neurontin and pain medication but may be considered for back injections again  Pt follows with dermatology for psoriasis - currently on dupixent and doing well  Pt with chronic kidney disease stage 3b - sees Dr Malen Gauze every 6 months  Mixed hyperlipidemia  Pt presents with hyperlipidemia. . Compliance with treatment has been good -The patient is compliant with medications, maintains a low cholesterol diet , follows up as directed ,. The patient denies experiencing any hypercholesterolemia related symptoms. Currently taking lipitor 40mg  qd and lovaza   Past Medical History:  Diagnosis Date   Arthritis    BPH without obstruction/lower urinary tract symptoms 02/01/2020   History of chicken pox    History of kidney stones    History of measles    History of mumps    Hyperlipidemia    Idiopathic gout of right knee 02/01/2020   Imbalance    secondary to back  issues   Multiple gastric ulcers    Numbness and tingling of both legs    Pneumonia    Psoriasis vulgaris 02/01/2020   Recurrent falls     Past Surgical History:  Procedure Laterality Date   bicep tear     right lower arm related to overgrowth of bone as stated per pt    KNEE ARTHROSCOPY     bilat   lithrotripsy     for kidney stones   LUMBAR LAMINECTOMY/DECOMPRESSION MICRODISCECTOMY N/A 09/07/2015   Procedure: MICRO LUMBAR DECOMPRESSION L4 - L5, REMOVAL OF SYNOVIAL CYST L4-L5;  Surgeon: Jene Every, MD;  Location: [facility];  Service: Orthopedics;  Laterality: N/A;   URETERAL STENT PLACEMENT     secondary to kidney stones/pt states had laser surgery prior     Family History  Problem Relation Age of Onset   Lung cancer Mother    Lung cancer Father    Diabetes Neg Hx     Social History   Socioeconomic History   Marital status: Married    Spouse name: Not on file   Number of children: Not on file   Years of education: Not on file   Highest education level: Not on file  Occupational History    Employer: [employer]   Tobacco Use   Smoking status: Never   Smokeless tobacco: Never  Substance and Sexual  Activity   Alcohol use: No   Drug use: No   Sexual activity: Yes    Partners: Female  Other Topics Concern   Not on file  Social History Narrative   Not on file   Social Determinants of Health   Financial Resource Strain: Low Risk  (03/21/2023)   Overall Financial Resource Strain (CARDIA)    Difficulty of Paying Living Expenses: Not hard at all  Food Insecurity: No Food Insecurity (03/21/2023)   Hunger Vital Sign    Worried About Running Out of Food in the Last Year: Never true    Ran Out of Food in the Last Year: Never true  Transportation Needs: No Transportation Needs (03/21/2023)   PRAPARE - Administrator, Civil Service (Medical): No    Lack of Transportation (Non-Medical): No  Physical Activity: Inactive (03/21/2023)   Exercise Vital Sign     Days of Exercise per Week: 0 days    Minutes of Exercise per Session: 0 min  Stress: No Stress Concern Present (03/21/2023)   [employer] of Occupational Health - Occupational Stress Questionnaire    Feeling of Stress : Not at all  Social Connections: Socially Isolated (03/21/2023)   Social Connection and Isolation Panel [NHANES]    Frequency of Communication with Friends and Family: More than three times a week    Frequency of Social Gatherings with Friends and Family: More than three times a week    Attends Religious Services: Never    Database administrator or Organizations: No    Attends Banker Meetings: Never    Marital Status: Widowed  Intimate Partner Violence: Not At Risk (03/21/2023)   Humiliation, Afraid, Rape, and Kick questionnaire    Fear of Current or Ex-Partner: No    Emotionally Abused: No    Physically Abused: No    Sexually Abused: No     Current Outpatient Medications:    acetaminophen (TYLENOL) 500 MG tablet, Tylenol Extra Strength, Disp: , Rfl:    allopurinol (ZYLOPRIM) 300 MG tablet, TAKE 1 TABLET BY MOUTH EVERY DAY, Disp: 90 tablet, Rfl: 1   aspirin EC 81 MG tablet, Take 81 mg by mouth daily. Swallow whole., Disp: , Rfl:    atorvastatin (LIPITOR) 40 MG tablet, Take 1 tablet (40 mg total) by mouth daily., Disp: 90 tablet, Rfl: 2   B-D 3CC LUER-LOK SYR 25GX1" 25G X 1" 3 ML MISC, USE 1 ML BY DOES NOT APPLY ROUTE EVERY 14 (FOURTEEN) DAYS. ( 1 AND 1/2 INCH ON BACKORDER), Disp: , Rfl:    colesevelam (WELCHOL) 625 MG tablet, TAKE 3 TABLETS (1,875 MG TOTAL) BY MOUTH DAILY., Disp: 270 tablet, Rfl: 0   dupilumab (DUPIXENT) 300 MG/2ML prefilled syringe, Inject 300 mg into the skin every 14 (fourteen) days., Disp: , Rfl:    empagliflozin (JARDIANCE) 25 MG TABS tablet, TAKE 1 TABLET BY MOUTH EVERY DAY BEFORE BREAKFAST, Disp: 90 tablet, Rfl: 1   finasteride (PROSCAR) 5 MG tablet, Take 5 mg by mouth daily., Disp: , Rfl:    fluticasone (FLONASE) 50 MCG/ACT nasal  spray, Place 2 sprays into both nostrils daily., Disp: 16 g, Rfl: 6   gabapentin (NEURONTIN) 300 MG capsule, Take 300 mg by mouth. Take 6 tablets as needed, Disp: , Rfl:    glucose blood (ONETOUCH VERIO) test strip, USE TO TEST SUGAR DAILY, Disp: 100 strip, Rfl: 3   Insulin Pen Needle (NOVOFINE PEN NEEDLE) 32G X 6 MM MISC, To use with insulin qd, Disp:  100 each, Rfl: 3   metFORMIN (GLUCOPHAGE-XR) 500 MG 24 hr tablet, TAKE 2 TABLETS BY MOUTH EVERY DAY WITH BREAKFAST, Disp: 180 tablet, Rfl: 1   metoCLOPramide (REGLAN) 10 MG tablet, Take 1 tablet (10 mg total) by mouth 3 (three) times daily before meals. For hiccups, Disp: 30 tablet, Rfl: 1   omega-3 acid ethyl esters (LOVAZA) 1 g capsule, TAKE 2 CAPSULES BY MOUTH 2 TIMES DAILY., Disp: 360 capsule, Rfl: 1   oxyCODONE-acetaminophen (PERCOCET) 10-325 MG tablet, Take 1 tablet by mouth every 4 (four) hours as needed for pain., Disp: 60 tablet, Rfl: 0   insulin degludec (TRESIBA FLEXTOUCH) 200 UNIT/ML FlexTouch Pen, Inject 44 Units into the skin daily., Disp: , Rfl:    tamsulosin (FLOMAX) 0.4 MG CAPS capsule, Take 0.4 mg by mouth daily., Disp: , Rfl:   Current Facility-Administered Medications:    testosterone cypionate (DEPOTESTOSTERONE CYPIONATE) injection 200 mg, 200 mg, Intramuscular, Q30 days, Marianne Sofia, PA-C, 200 mg at 05/13/23 0820   Allergies  Allergen Reactions   Bromocriptine Other (See Comments)    GI issues   Ciprofloxacin Other (See Comments)    "Tendon issues" Other reaction(s): other   Dulaglutide Other (See Comments) and Nausea Only    GI issues    CONSTITUTIONAL: Negative for chills, fatigue, fever, unintentional weight gain and unintentional weight loss.  E/N/T: Negative for ear pain, nasal congestion and sore throat.  CARDIOVASCULAR: Negative for chest pain, dizziness, palpitations and pedal edema.  RESPIRATORY: Negative for recent cough and dyspnea.  GASTROINTESTINAL: Negative for abdominal pain, acid reflux symptoms,  constipation, diarrhea, nausea and vomiting.  MSK: Negative for arthralgias and myalgias.  INTEGUMENTARY: Negative for rash.  NEUROLOGICAL: Negative for dizziness and headaches.  PSYCHIATRIC: Negative for sleep disturbance and to question depression screen.  Negative for depression, negative for anhedonia.        Objective:  PHYSICAL EXAM:   VS: BP 128/64 (BP Location: Left Arm, Patient Position: Sitting, Cuff Size: Large)   Pulse 97   Temp (!) 97.2 F (36.2 C) (Temporal)   Ht 5\' 11"  (1.803 m)   Wt 258 lb 3.2 oz (117.1 kg)   SpO2 95%   BMI 36.01 kg/m   GEN: Well nourished, well developed, in no acute distress   Cardiac: RRR; no murmurs, rubs, or gallops,no edema -  Respiratory:  normal respiratory rate and pattern with no distress - normal breath sounds with no rales, rhonchi, wheezes or rubs  MS: no deformity or atrophy  Skin: warm and dry, no rash  Neuro:  Alert and Oriented x 3, - CN II-Xii grossly intact Psych: euthymic mood, appropriate affect and demeanor    There are no preventive care reminders to display for this patient.   There are no preventive care reminders to display for this patient.  Lab Results  Component Value Date   TSH 2.110 12/06/2022   Lab Results  Component Value Date   WBC 4.3 02/07/2023   HGB 16.3 02/07/2023   HCT 48.6 02/07/2023   MCV 93 02/07/2023   PLT 137 (L) 02/07/2023   Lab Results  Component Value Date   NA 142 02/07/2023   K 4.1 02/07/2023   CO2 19 (L) 02/07/2023   GLUCOSE 185 (H) 02/07/2023   BUN 14 02/07/2023   CREATININE 1.09 02/07/2023   BILITOT 0.7 02/07/2023   ALKPHOS 100 02/07/2023   AST 16 02/07/2023   ALT 25 02/07/2023   PROT 6.0 02/07/2023   ALBUMIN 4.2 02/07/2023   CALCIUM 9.3  02/07/2023   ANIONGAP 7 08/31/2015   EGFR 70.0 04/12/2023   GFR 47.85 (L) 03/09/2022   Lab Results  Component Value Date   CHOL 124 12/06/2022   Lab Results  Component Value Date   HDL 35 (L) 12/06/2022   Lab Results   Component Value Date   LDLCALC 44 12/06/2022   Lab Results  Component Value Date   TRIG 292 (H) 12/06/2022   Lab Results  Component Value Date   CHOLHDL 3.5 12/06/2022   Lab Results  Component Value Date   HGBA1C 9.1 (H) 12/06/2022      Assessment & Plan:   Problem List Items Addressed This Visit       Endocrine   Type 2 diabetes mellitus with stage 3a chronic kidney disease, without long-term current use of insulin - Primary   Relevant Medications   insulin degludec (TRESIBA FLEXTOUCH) 200 UNIT/ML FlexTouch Pen Continue current meds as directed Return in 3 months for fasting labs  Hyperlipidemia Continue meds  BPH Continue meds  Follow up with urology as scheduled  Psoriasis Continue med as directed Follow up with dermatology as directed  Chronic low back pain Continue with pain management       Other   Meds ordered this encounter  Medications   insulin degludec (TRESIBA FLEXTOUCH) 200 UNIT/ML FlexTouch Pen    Sig: Inject 44 Units into the skin daily.    Order Specific Question:   Supervising Provider    Answer:   Blane Ohara [phone]    Follow-up: Return in about 3 months (around 08/20/2023) for chronic fasting follow-up- tomorrow fasting labwork.    Brent Duarte , PA-C

## 2023-05-21 ENCOUNTER — Other Ambulatory Visit: Payer: Medicare Other

## 2023-05-21 ENCOUNTER — Other Ambulatory Visit: Payer: Self-pay | Admitting: Physician Assistant

## 2023-05-21 DIAGNOSIS — E782 Mixed hyperlipidemia: Secondary | ICD-10-CM

## 2023-05-21 DIAGNOSIS — I10 Essential (primary) hypertension: Secondary | ICD-10-CM

## 2023-05-21 DIAGNOSIS — N1831 Chronic kidney disease, stage 3a: Secondary | ICD-10-CM

## 2023-05-22 ENCOUNTER — Other Ambulatory Visit: Payer: Self-pay | Admitting: Family Medicine

## 2023-05-22 DIAGNOSIS — I10 Essential (primary) hypertension: Secondary | ICD-10-CM

## 2023-05-31 ENCOUNTER — Other Ambulatory Visit: Payer: Self-pay | Admitting: Family Medicine

## 2023-05-31 DIAGNOSIS — E291 Testicular hypofunction: Secondary | ICD-10-CM

## 2023-06-05 ENCOUNTER — Other Ambulatory Visit: Payer: Self-pay | Admitting: Family Medicine

## 2023-06-05 DIAGNOSIS — E291 Testicular hypofunction: Secondary | ICD-10-CM

## 2023-06-10 ENCOUNTER — Other Ambulatory Visit: Payer: Self-pay

## 2023-06-10 NOTE — Telephone Encounter (Signed)
Patient made aware, patient stated he will call urologist and talk to them about it.

## 2023-06-10 NOTE — Telephone Encounter (Signed)
Please call this pt - he will need to get testosterone filled through his urologist

## 2023-07-01 ENCOUNTER — Ambulatory Visit (INDEPENDENT_AMBULATORY_CARE_PROVIDER_SITE_OTHER): Payer: Medicare Other

## 2023-07-01 DIAGNOSIS — Z23 Encounter for immunization: Secondary | ICD-10-CM | POA: Diagnosis not present

## 2023-07-01 NOTE — Progress Notes (Signed)
Patient is in office today for a nurse visit for  Flu shot . Patient Injection was given in the  Left deltoid. Patient tolerated injection well.

## 2023-07-21 ENCOUNTER — Other Ambulatory Visit: Payer: Self-pay | Admitting: Family Medicine

## 2023-07-22 ENCOUNTER — Other Ambulatory Visit: Payer: Self-pay | Admitting: Physician Assistant

## 2023-07-22 DIAGNOSIS — J069 Acute upper respiratory infection, unspecified: Secondary | ICD-10-CM

## 2023-07-23 ENCOUNTER — Other Ambulatory Visit: Payer: Self-pay | Admitting: Family Medicine

## 2023-07-23 DIAGNOSIS — E1165 Type 2 diabetes mellitus with hyperglycemia: Secondary | ICD-10-CM

## 2023-07-26 ENCOUNTER — Other Ambulatory Visit: Payer: Self-pay | Admitting: Family Medicine

## 2023-07-26 ENCOUNTER — Other Ambulatory Visit: Payer: Self-pay | Admitting: Physician Assistant

## 2023-07-26 DIAGNOSIS — I10 Essential (primary) hypertension: Secondary | ICD-10-CM

## 2023-08-02 ENCOUNTER — Other Ambulatory Visit: Payer: Self-pay | Admitting: Physician Assistant

## 2023-08-19 IMAGING — MR MR PROSTATE WO/W CM
12 series · 48 of 48 positions shown · IV contrast (multihance)
Comparison: Pelvic CT 06/23/2020

CLINICAL DATA: Elevated PSA level of 5.20 on 06/23/2021.

EXAM:
MR PROSTATE WITHOUT AND WITH CONTRAST
TECHNIQUE: Multiplanar multisequence MRI images were obtained of the pelvis
centered about the prostate. Pre and post contrast images were
obtained.
CONTRAST:  20mL MULTIHANCE GADOBENATE DIMEGLUMINE 529 MG/ML IV SOLN

[Series 3: T2 · coronal · 3.0mm · 0.56mm/px · 1 of 30 slices shown (1 of 3)]
[im 1/30]
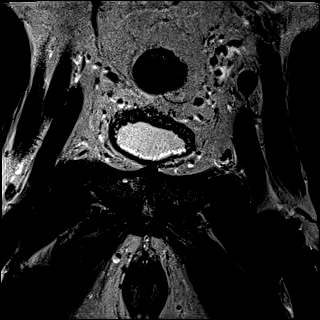

[Series 4: T1 · axial · 5.0mm · 1.25mm/px · 1 of 80 slices shown]
[im 1/80]
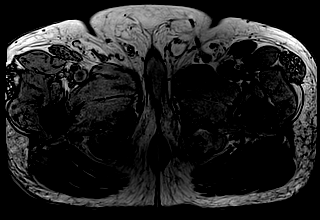

[Series 5: DWI · axial · 3.0mm · 1.75mm/px · 1 of 90 slices shown (1 of 3)]
[im 1/90]
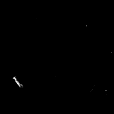

[Series 6: DWI · axial · 3.0mm · 1.75mm/px · 1 of 30 slices shown (2 of 3)]
[im 1/30]
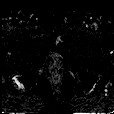

[Series 7: DWI · axial · 3.0mm · 1.75mm/px · 1 of 30 slices shown (3 of 3)]
[im 1/30]
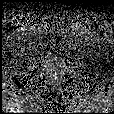

[Series 8: T2 · axial · 3.0mm · 0.56mm/px · 1 of 30 slices shown (2 of 3)]
[im 1/30]
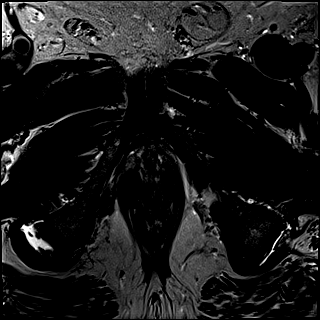

[Series 9: T2 · axial · 1.0mm · 1.04mm/px · z∈[-12,+75]mm · 2 of 88 slices shown (3 of 3)]
[im 1/88]
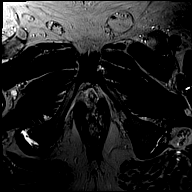
[im 88/88]
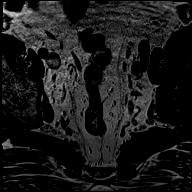

[Series 10: pre t1_twist_tra_dyn · axial · non-contrast · 3.5mm · 0.83mm/px · 1 of 26 slices shown]
[im 1/26]
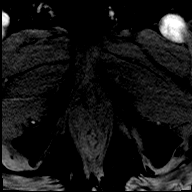

[Series 11: post t1_twist_tra_dyn-copy center · axial · non-contrast · 3.5mm · 0.83mm/px · z∈[-11,+76]mm · 18 of 780 slices shown]
[im 1/780]
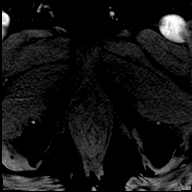
[im 46/780]
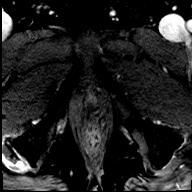
[im 92/780]
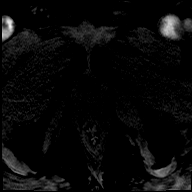
[im 138/780]
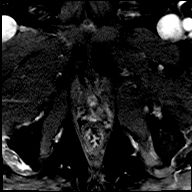
[im 184/780]
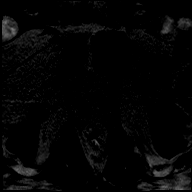
[im 230/780]
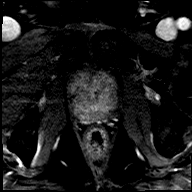
[im 275/780]
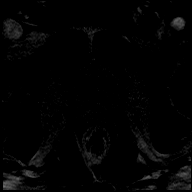
[im 321/780]
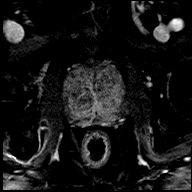
[im 367/780]
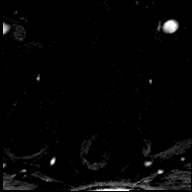
[im 413/780]
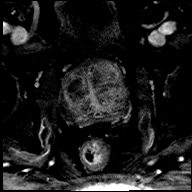
[im 459/780]
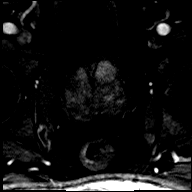
[im 505/780]
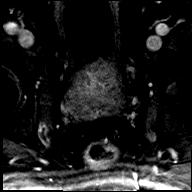
[im 550/780]
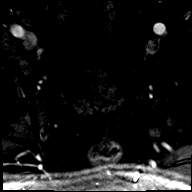
[im 596/780]
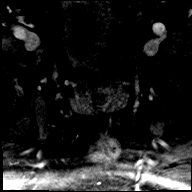
[im 642/780]
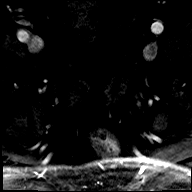
[im 688/780]
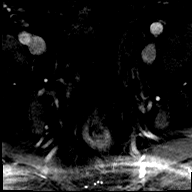
[im 734/780]
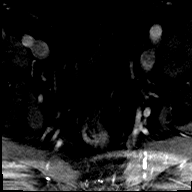
[im 780/780]
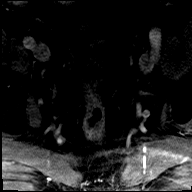

[Series 12: post t1_twist_tra_dyn-copy cent_sub · axial · 3.5mm · 0.83mm/px · z∈[-11,+76]mm · 17 of 754 slices shown]
[im 1/754]
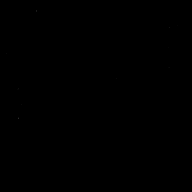
[im 48/754]
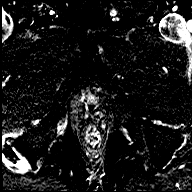
[im 95/754]
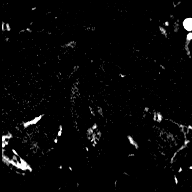
[im 142/754]
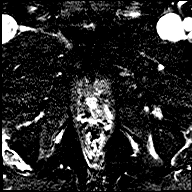
[im 189/754]
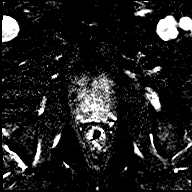
[im 236/754]
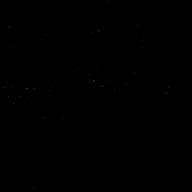
[im 283/754]
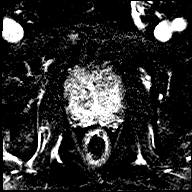
[im 330/754]
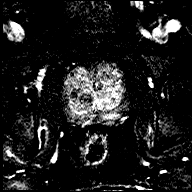
[im 377/754]
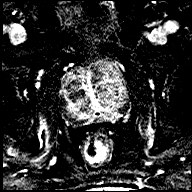
[im 424/754]
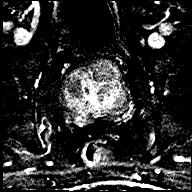
[im 471/754]
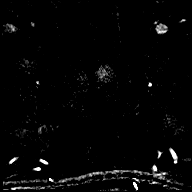
[im 518/754]
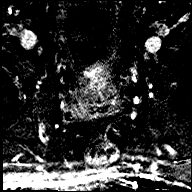
[im 565/754]
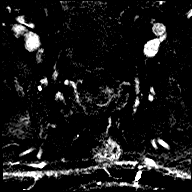
[im 612/754]
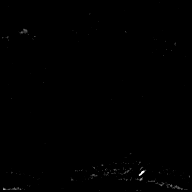
[im 659/754]
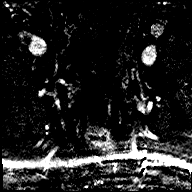
[im 706/754]
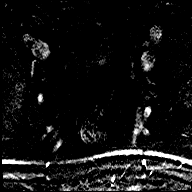
[im 754/754]
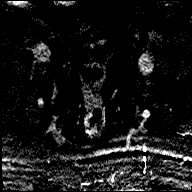

[Series 13: t1_vibe_dixon_tra_f · axial · 2.5mm · 0.91mm/px · z∈[-38,+160]mm · 2 of 80 slices shown]
[im 1/80]
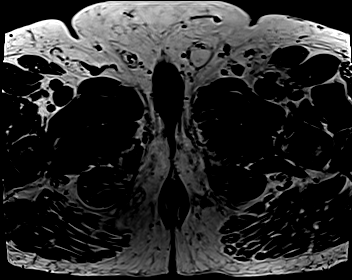
[im 80/80]
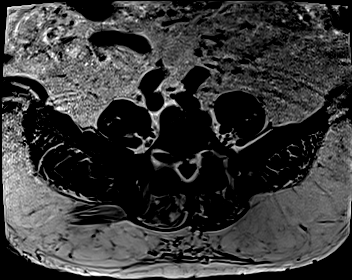

[Series 14: t1_vibe_dixon_tra_w · axial · 2.5mm · 0.91mm/px · z∈[-38,+160]mm · 2 of 80 slices shown]
[im 1/80]
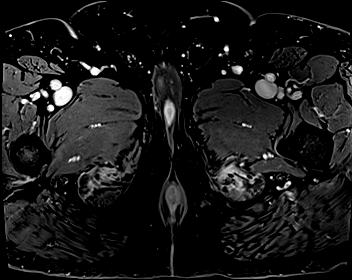
[im 80/80]
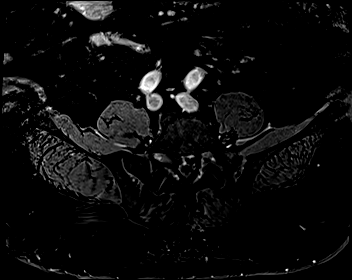

[48 of 48 positions shown; findings below may reference images not displayed]

FINDINGS: Prostate:

Encapsulated nodularity in the transition zone compatible with
benign prostatic hypertrophy.

Region of interest # 1: PI-RADS category 4 lesion of the bilateral
posteromedial peripheral zone in the mid gland and apex, with
focally reduced T2 signal (image 59, series 9) and reduced ADC map
activity (image 21, series 6) with associated early enhancement
(image 200, series 15). This measures 4.02 cc (3.8 by 0.6 by 3.0 cm

Region of interest # 2: Small PI-RADS category 3 lesion of the left
posterolateral peripheral zone at the apex, with focally reduced T2
signal (image 75, series 9) and focally reduced ADC map activity
(image 25, series 6). This measures 0.19 cc (0.8 by 0.6 by 0.5 cm).

Volume: 3D volumetric analysis: Prostate volume 94.32 cc (6.2 by
by 6.2 cm).

Transcapsular spread:  Absent

Seminal vesicle involvement: Absent

Neurovascular bundle involvement: Absent

Pelvic adenopathy: Absent

Bone metastasis: Absent

Other findings: Bilateral obturator internus bursitis as shown for
example on image 51 series 9. Substantial bilateral hamstring
tendinopathy. Sigmoid colon diverticulosis.
IMPRESSION: 1. PI-RADS category 4 lesion of the bilateral posteromedial
peripheral zone. Small PI-RADS category 3 lesion of the left
posterolateral peripheral zone. Targeting data sent to [facility].
2. Prostatomegaly and benign prostatic hypertrophy.
3. Bilateral obturator internus bursitis. Bilateral hamstring
tendinopathy.

## 2023-08-23 ENCOUNTER — Encounter: Payer: Self-pay | Admitting: Physician Assistant

## 2023-08-23 ENCOUNTER — Ambulatory Visit (INDEPENDENT_AMBULATORY_CARE_PROVIDER_SITE_OTHER): Payer: Medicare Other | Admitting: Physician Assistant

## 2023-08-23 VITALS — BP 132/72 | HR 76 | Temp 97.2°F | Ht 71.0 in | Wt 255.0 lb

## 2023-08-23 DIAGNOSIS — M4300 Spondylolysis, site unspecified: Secondary | ICD-10-CM

## 2023-08-23 DIAGNOSIS — E1122 Type 2 diabetes mellitus with diabetic chronic kidney disease: Secondary | ICD-10-CM | POA: Diagnosis not present

## 2023-08-23 DIAGNOSIS — E1169 Type 2 diabetes mellitus with other specified complication: Secondary | ICD-10-CM

## 2023-08-23 DIAGNOSIS — E1159 Type 2 diabetes mellitus with other circulatory complications: Secondary | ICD-10-CM | POA: Diagnosis not present

## 2023-08-23 DIAGNOSIS — N400 Enlarged prostate without lower urinary tract symptoms: Secondary | ICD-10-CM

## 2023-08-23 DIAGNOSIS — N1831 Chronic kidney disease, stage 3a: Secondary | ICD-10-CM

## 2023-08-23 DIAGNOSIS — E785 Hyperlipidemia, unspecified: Secondary | ICD-10-CM

## 2023-08-23 DIAGNOSIS — I152 Hypertension secondary to endocrine disorders: Secondary | ICD-10-CM

## 2023-08-23 DIAGNOSIS — L400 Psoriasis vulgaris: Secondary | ICD-10-CM | POA: Diagnosis not present

## 2023-08-23 LAB — COMPREHENSIVE METABOLIC PANEL
ALT: 30 [IU]/L (ref 0–44)
AST: 20 [IU]/L (ref 0–40)
Albumin: 4.5 g/dL (ref 3.9–4.9)
Alkaline Phosphatase: 105 [IU]/L (ref 44–121)
BUN/Creatinine Ratio: 21 (ref 10–24)
BUN: 23 mg/dL (ref 8–27)
Bilirubin Total: 1.7 mg/dL — ABNORMAL HIGH (ref 0.0–1.2)
CO2: 22 mmol/L (ref 20–29)
Calcium: 9.5 mg/dL (ref 8.6–10.2)
Chloride: 102 mmol/L (ref 96–106)
Creatinine, Ser: 1.09 mg/dL (ref 0.76–1.27)
Globulin, Total: 1.8 g/dL (ref 1.5–4.5)
Glucose: 134 mg/dL — ABNORMAL HIGH (ref 70–99)
Potassium: 4.5 mmol/L (ref 3.5–5.2)
Sodium: 139 mmol/L (ref 134–144)
Total Protein: 6.3 g/dL (ref 6.0–8.5)
eGFR: 74 mL/min/{1.73_m2} (ref >59)

## 2023-08-23 LAB — CBC WITH DIFFERENTIAL/PLATELET
Basophils Absolute: 0 10*3/uL (ref 0.0–0.2)
Basos: 0 %
EOS (ABSOLUTE): 0.1 10*3/uL (ref 0.0–0.4)
Eos: 2 %
Hematocrit: 49 % (ref 37.5–51.0)
Hemoglobin: 17 g/dL (ref 13.0–17.7)
Immature Grans (Abs): 0.1 10*3/uL (ref 0.0–0.1)
Immature Granulocytes: 1 %
Lymphocytes Absolute: 1.1 10*3/uL (ref 0.7–3.1)
Lymphs: 16 %
MCH: 32.6 pg (ref 26.6–33.0)
MCHC: 34.7 g/dL (ref 31.5–35.7)
MCV: 94 fL (ref 79–97)
Monocytes Absolute: 0.5 10*3/uL (ref 0.1–0.9)
Monocytes: 7 %
Neutrophils Absolute: 5.1 10*3/uL (ref 1.4–7.0)
Neutrophils: 74 %
Platelets: 103 10*3/uL — ABNORMAL LOW (ref 150–450)
RBC: 5.22 x10E6/uL (ref 4.14–5.80)
RDW: 15.1 % (ref 11.6–15.4)
WBC: 6.9 10*3/uL (ref 3.4–10.8)

## 2023-08-23 LAB — LIPID PANEL
Chol/HDL Ratio: 3.3 ratio (ref 0.0–5.0)
Cholesterol, Total: 119 mg/dL (ref 100–199)
HDL: 36 mg/dL — ABNORMAL LOW (ref >39)
LDL Chol Calc (NIH): 43 mg/dL (ref 0–99)
Triglycerides: 256 mg/dL — ABNORMAL HIGH (ref 0–149)
VLDL Cholesterol Cal: 40 mg/dL (ref 5–40)

## 2023-08-23 LAB — HEMOGLOBIN A1C
Est. average glucose Bld gHb Est-mCnc: 166 mg/dL
Hgb A1c MFr Bld: 7.4 % — ABNORMAL HIGH (ref 4.8–5.6)

## 2023-08-23 NOTE — Progress Notes (Addendum)
Established Patient Office Visit  Subjective:  Patient ID: Brent Duarte, male    DOB: 07/12/1956  Age: 67 y.o. MRN: [id number]  CC:  Chief Complaint  Patient presents with   Medical Management of Chronic Issues    HPI RECO ISEMINGER presents for follow up of diabetes Pt states overall he is doing better with glucose and brings log that shows most fasting glucose under 150 and mid afternoons ranging under 200s -  He is currently taking jardiance 25mg  qd, tresiba 54 units daily, and metformin 500mg  bid He is on a statin  -  Pt is up to date on eye exam  Pt has history of BPH and currently following with urology - his next appt is in December.  He states that he has been advised to have another biopsy of his prostate because his 'levels are high" (assume PSA) but pt is refusing to have done  He is on proscar and has also started flomax He has history of testosterone deficiency but with issues with prostate I have deferred treatment to urology  Pt follows with pain management for chronic low back pain with Dr Ethelene Hal - he was last seen a few weeks ago and had a back injection that worked well for him.  However he did lots of work around his house and pain has flared back up and they have prescribed a short dose of prednisone  Pt follows with dermatology for psoriasis - currently on dupixent and doing well  Pt with chronic kidney disease stage 3b - sees Dr Malen Gauze every 6 months  Mixed hyperlipidemia  Pt presents with hyperlipidemia. . Compliance with treatment has been good -The patient is compliant with medications, maintains a low cholesterol diet , follows up as directed ,. The patient denies experiencing any hypercholesterolemia related symptoms. Currently taking lipitor 40mg  qd, welchol and lovaza   Past Medical History:  Diagnosis Date   Arthritis    BPH without obstruction/lower urinary tract symptoms 02/01/2020   History of chicken pox    History of kidney stones    History of  measles    History of mumps    Hyperlipidemia    Idiopathic gout of right knee 02/01/2020   Imbalance    secondary to back issues   Multiple gastric ulcers    Numbness and tingling of both legs    Pneumonia    Psoriasis vulgaris 02/01/2020   Recurrent falls     Past Surgical History:  Procedure Laterality Date   bicep tear     right lower arm related to overgrowth of bone as stated per pt    KNEE ARTHROSCOPY     bilat   lithrotripsy     for kidney stones   LUMBAR LAMINECTOMY/DECOMPRESSION MICRODISCECTOMY N/A 09/07/2015   Procedure: MICRO LUMBAR DECOMPRESSION L4 - L5, REMOVAL OF SYNOVIAL CYST L4-L5;  Surgeon: Jene Every, MD;  Location: [facility];  Service: Orthopedics;  Laterality: N/A;   URETERAL STENT PLACEMENT     secondary to kidney stones/pt states had laser surgery prior     Family History  Problem Relation Age of Onset   Lung cancer Mother    Lung cancer Father    Diabetes Neg Hx     Social History   Socioeconomic History   Marital status: Married    Spouse name: Not on file   Number of children: Not on file   Years of education: Not on file   Highest education level: Not on file  Occupational History    Employer: [employer]   Tobacco Use   Smoking status: Never   Smokeless tobacco: Never  Substance and Sexual Activity   Alcohol use: No   Drug use: No   Sexual activity: Yes    Partners: Female  Other Topics Concern   Not on file  Social History Narrative   Not on file   Social Determinants of Health   Financial Resource Strain: Low Risk  (03/21/2023)   Overall Financial Resource Strain (CARDIA)    Difficulty of Paying Living Expenses: Not hard at all  Food Insecurity: No Food Insecurity (03/21/2023)   Hunger Vital Sign    Worried About Running Out of Food in the Last Year: Never true    Ran Out of Food in the Last Year: Never true  Transportation Needs: No Transportation Needs (03/21/2023)   PRAPARE - Administrator, Civil Service  (Medical): No    Lack of Transportation (Non-Medical): No  Physical Activity: Inactive (03/21/2023)   Exercise Vital Sign    Days of Exercise per Week: 0 days    Minutes of Exercise per Session: 0 min  Stress: No Stress Concern Present (03/21/2023)   [employer] of Occupational Health - Occupational Stress Questionnaire    Feeling of Stress : Not at all  Social Connections: Socially Isolated (03/21/2023)   Social Connection and Isolation Panel [NHANES]    Frequency of Communication with Friends and Family: More than three times a week    Frequency of Social Gatherings with Friends and Family: More than three times a week    Attends Religious Services: Never    Database administrator or Organizations: No    Attends Banker Meetings: Never    Marital Status: Widowed  Intimate Partner Violence: Not At Risk (03/21/2023)   Humiliation, Afraid, Rape, and Kick questionnaire    Fear of Current or Ex-Partner: No    Emotionally Abused: No    Physically Abused: No    Sexually Abused: No     Current Outpatient Medications:    acetaminophen (TYLENOL) 500 MG tablet, Tylenol Extra Strength, Disp: , Rfl:    allopurinol (ZYLOPRIM) 300 MG tablet, TAKE 1 TABLET BY MOUTH EVERY DAY, Disp: 90 tablet, Rfl: 1   aspirin EC 81 MG tablet, Take 81 mg by mouth daily. Swallow whole., Disp: , Rfl:    atorvastatin (LIPITOR) 40 MG tablet, Take 1 tablet (40 mg total) by mouth daily., Disp: 90 tablet, Rfl: 2   B-D 3CC LUER-LOK SYR 25GX1" 25G X 1" 3 ML MISC, USE 1 ML BY DOES NOT APPLY ROUTE EVERY 14 (FOURTEEN) DAYS. ( 1 AND 1/2 INCH ON BACKORDER), Disp: , Rfl:    colesevelam (WELCHOL) 625 MG tablet, TAKE 3 TABLETS (1,875 MG TOTAL) BY MOUTH DAILY., Disp: 270 tablet, Rfl: 0   dupilumab (DUPIXENT) 300 MG/2ML prefilled syringe, Inject 300 mg into the skin every 14 (fourteen) days., Disp: , Rfl:    empagliflozin (JARDIANCE) 25 MG TABS tablet, TAKE 1 TABLET BY MOUTH EVERY DAY BEFORE BREAKFAST, Disp: 90 tablet,  Rfl: 1   finasteride (PROSCAR) 5 MG tablet, Take 5 mg by mouth daily., Disp: , Rfl:    fluticasone (FLONASE) 50 MCG/ACT nasal spray, SPRAY 2 SPRAYS INTO EACH NOSTRIL EVERY DAY, Disp: 48 mL, Rfl: 2   glucose blood (ONETOUCH VERIO) test strip, USE TO TEST SUGAR DAILY, Disp: 100 strip, Rfl: 3   insulin degludec (TRESIBA FLEXTOUCH) 200 UNIT/ML FlexTouch Pen, Inject 44  Units into the skin daily. (Patient taking differently: Inject 54 Units into the skin daily.), Disp: , Rfl:    Insulin Pen Needle (NOVOFINE PEN NEEDLE) 32G X 6 MM MISC, To use with insulin qd, Disp: 100 each, Rfl: 3   metFORMIN (GLUCOPHAGE-XR) 500 MG 24 hr tablet, TAKE 2 TABLETS BY MOUTH EVERY DAY WITH BREAKFAST, Disp: 180 tablet, Rfl: 1   omega-3 acid ethyl esters (LOVAZA) 1 g capsule, TAKE 2 CAPSULES BY MOUTH 2 TIMES DAILY., Disp: 360 capsule, Rfl: 1   oxyCODONE-acetaminophen (PERCOCET) 10-325 MG tablet, Take 1 tablet by mouth every 4 (four) hours as needed for pain., Disp: 60 tablet, Rfl: 0   tamsulosin (FLOMAX) 0.4 MG CAPS capsule, Take 0.4 mg by mouth daily., Disp: , Rfl:    testosterone cypionate (DEPOTESTOSTERONE CYPIONATE) 200 MG/ML injection, Inject 200 mg into the muscle every 30 (thirty) days., Disp: , Rfl:    Allergies  Allergen Reactions   Bromocriptine Other (See Comments)    GI issues   Ciprofloxacin Other (See Comments)    "Tendon issues" Other reaction(s): other   Dulaglutide Other (See Comments) and Nausea Only    GI issues  CONSTITUTIONAL: Negative for chills, fatigue, fever, unintentional weight gain and unintentional weight loss.  E/N/T: Negative for ear pain, nasal congestion and sore throat.  CARDIOVASCULAR: Negative for chest pain, dizziness, palpitations and pedal edema.  RESPIRATORY: Negative for recent cough and dyspnea.  GASTROINTESTINAL: Negative for abdominal pain, acid reflux symptoms, constipation, diarrhea, nausea and vomiting.  MSK: see HPI INTEGUMENTARY: Negative for rash.  NEUROLOGICAL:  Negative for dizziness and headaches.  PSYCHIATRIC: Negative for sleep disturbance and to question depression screen.  Negative for depression, negative for anhedonia.          Objective:  PHYSICAL EXAM:   VS: BP 132/72 (BP Location: Left Arm, Patient Position: Sitting)   Pulse 76   Temp (!) 97.2 F (36.2 C) (Temporal)   Ht 5\' 11"  (1.803 m)   Wt 255 lb (115.7 kg)   SpO2 99%   BMI 35.57 kg/m   GEN: Well nourished, well developed, in no acute distress   Cardiac: RRR; no murmurs, rubs, or gallops,no edema -  Respiratory:  normal respiratory rate and pattern with no distress - normal breath sounds with no rales, rhonchi, wheezes or rubs  MS: no deformity or atrophy  Skin: warm and dry, no rash  Neuro:  Alert and Oriented x 3,- CN II-Xii grossly intact Psych: euthymic mood, appropriate affect and demeanor Diabetic Foot Exam - Simple   Simple Foot Form Diabetic Foot exam was performed with the following findings: Yes 08/23/2023  8:51 AM  Visual Inspection No deformities, no ulcerations, no other skin breakdown bilaterally: Yes Sensation Testing Intact to touch and monofilament testing bilaterally: Yes Pulse Check Posterior Tibialis and Dorsalis pulse intact bilaterally: Yes Comments      There are no preventive care reminders to display for this patient.   There are no preventive care reminders to display for this patient.  Lab Results  Component Value Date   TSH 1.790 05/21/2023   Lab Results  Component Value Date   WBC 4.7 05/21/2023   HGB 16.8 05/21/2023   HCT 50.8 05/21/2023   MCV 95 05/21/2023   PLT 124 (L) 05/21/2023   Lab Results  Component Value Date   NA 142 05/21/2023   K 4.0 05/21/2023   CO2 21 05/21/2023   GLUCOSE 154 (H) 05/21/2023   BUN 16 05/21/2023   CREATININE 1.04 05/21/2023  BILITOT 0.8 05/21/2023   ALKPHOS 104 05/21/2023   AST 18 05/21/2023   ALT 23 05/21/2023   PROT 6.2 05/21/2023   ALBUMIN 4.4 05/21/2023   CALCIUM 9.5  05/21/2023   ANIONGAP 7 08/31/2015   EGFR 79 05/21/2023   GFR 47.85 (L) 03/09/2022   Lab Results  Component Value Date   CHOL 92 (L) 05/21/2023   Lab Results  Component Value Date   HDL 29 (L) 05/21/2023   Lab Results  Component Value Date   LDLCALC 30 05/21/2023   Lab Results  Component Value Date   TRIG 209 (H) 05/21/2023   Lab Results  Component Value Date   CHOLHDL 3.2 05/21/2023   Lab Results  Component Value Date   HGBA1C 7.1 (H) 05/21/2023      Assessment & Plan:   Problem List Items Addressed This Visit       Endocrine   Type 2 diabetes mellitus with stage 3a chronic kidney disease, without long-term current use of insulin - Primary   Relevant Medications   insulin degludec (TRESIBA FLEXTOUCH) 200 UNIT/ML FlexTouch Pen Continue current meds as directed Return in 4 months for fasting labs  Hyperlipidemia associated with diabetes (HCC) Continue meds  BPH Continue meds  Follow up with urology as scheduled  Psoriasis Continue med as directed Follow up with dermatology as directed  Chronic low back pain Continue with pain management  Hypertension associated with diabetes (HCC) Continue current meds     Other   No orders of the defined types were placed in this encounter.   Follow-up: Return in about 4 months (around 12/21/2023) for chronic fasting follow-up.    SARA R Willena Jeancharles, PA-C

## 2023-08-26 ENCOUNTER — Other Ambulatory Visit: Payer: Self-pay | Admitting: Physician Assistant

## 2023-08-26 DIAGNOSIS — N1831 Chronic kidney disease, stage 3a: Secondary | ICD-10-CM

## 2023-08-26 MED ORDER — TRESIBA FLEXTOUCH 200 UNIT/ML ~~LOC~~ SOPN: 56.0000 [IU] | PEN_INJECTOR | Freq: Every day | SUBCUTANEOUS | Status: DC

## 2023-10-23 ENCOUNTER — Other Ambulatory Visit: Payer: Self-pay | Admitting: Physician Assistant

## 2023-10-23 DIAGNOSIS — N1831 Chronic kidney disease, stage 3a: Secondary | ICD-10-CM

## 2023-10-31 ENCOUNTER — Other Ambulatory Visit: Payer: Self-pay | Admitting: Physician Assistant

## 2023-11-19 ENCOUNTER — Other Ambulatory Visit: Payer: Self-pay | Admitting: Family Medicine

## 2023-11-20 ENCOUNTER — Other Ambulatory Visit: Payer: Self-pay | Admitting: Physician Assistant

## 2023-12-11 ENCOUNTER — Other Ambulatory Visit: Payer: Self-pay | Admitting: Family Medicine

## 2023-12-11 DIAGNOSIS — E782 Mixed hyperlipidemia: Secondary | ICD-10-CM

## 2023-12-20 ENCOUNTER — Ambulatory Visit: Payer: Medicare Other | Admitting: Physician Assistant

## 2023-12-26 ENCOUNTER — Telehealth: Payer: Self-pay

## 2023-12-26 ENCOUNTER — Ambulatory Visit: Admitting: Physician Assistant

## 2023-12-26 ENCOUNTER — Encounter: Payer: Self-pay | Admitting: Physician Assistant

## 2023-12-26 VITALS — BP 130/70 | HR 84 | Temp 97.3°F | Resp 18 | Ht 71.0 in | Wt 262.6 lb

## 2023-12-26 DIAGNOSIS — L400 Psoriasis vulgaris: Secondary | ICD-10-CM

## 2023-12-26 DIAGNOSIS — E1122 Type 2 diabetes mellitus with diabetic chronic kidney disease: Secondary | ICD-10-CM

## 2023-12-26 DIAGNOSIS — E1169 Type 2 diabetes mellitus with other specified complication: Secondary | ICD-10-CM | POA: Diagnosis not present

## 2023-12-26 DIAGNOSIS — E785 Hyperlipidemia, unspecified: Secondary | ICD-10-CM

## 2023-12-26 DIAGNOSIS — I152 Hypertension secondary to endocrine disorders: Secondary | ICD-10-CM

## 2023-12-26 DIAGNOSIS — E1159 Type 2 diabetes mellitus with other circulatory complications: Secondary | ICD-10-CM

## 2023-12-26 DIAGNOSIS — M4300 Spondylolysis, site unspecified: Secondary | ICD-10-CM

## 2023-12-26 DIAGNOSIS — Z8739 Personal history of other diseases of the musculoskeletal system and connective tissue: Secondary | ICD-10-CM

## 2023-12-26 DIAGNOSIS — N1831 Chronic kidney disease, stage 3a: Secondary | ICD-10-CM

## 2023-12-26 DIAGNOSIS — N400 Enlarged prostate without lower urinary tract symptoms: Secondary | ICD-10-CM

## 2023-12-26 NOTE — Progress Notes (Signed)
 Established Patient Office Visit  Subjective:  Patient ID: Brent Duarte, male    DOB: [birth year]  Age: 68 y.o. MRN: [id number]  CC:  Chief Complaint  Patient presents with   Medical Management of Chronic Issues    HPI [name] presents for follow up of diabetes Pt states overall he is doing better with glucose and brings log that shows most fasting glucose readings under 200 except some elevations noted after eating junk food/cake/soda -- he has had no low readings -  He is currently taking jardiance 25mg  qd, tresiba 56 units daily, and metformin 500mg  bid He is on a statin  - lipitor 40mg  qd  Pt is up to date on eye exam  Pt has history of BPH and has seen urology -  He states that he has been advised to have another biopsy of his prostate because his 'levels are high" (assume PSA) but pt is refusing to have done  He is on proscar and has also  flomax He has not had urology follow up appt recently  Pt follows with pain management for chronic low back pain with Dr Ethelene Hal - he has received injections in the past which have been helpful but has not had one in past few months   Pt follows with dermatology for psoriasis - currently on dupixent and doing well  Pt with chronic kidney disease stage 3b - sees Dr Malen Gauze every 6 months  Mixed hyperlipidemia  Pt presents with hyperlipidemia. . Compliance with treatment has been good -The patient is compliant with medications, maintains a low cholesterol diet , follows up as directed ,. The patient denies experiencing any hypercholesterolemia related symptoms. Currently taking lipitor 40mg  qd, welchol and lovaza  Pt with history of gout - takes allopurinol 300mg  Past Medical History:  Diagnosis Date   Arthritis    BPH without obstruction/lower urinary tract symptoms 02/01/2020   History of chicken pox    History of kidney stones    History of measles    History of mumps    Hyperlipidemia    Idiopathic gout of right knee 02/01/2020    Imbalance    secondary to back issues   Multiple gastric ulcers    Numbness and tingling of both legs    Pneumonia    Psoriasis vulgaris 02/01/2020   Recurrent falls     Past Surgical History:  Procedure Laterality Date   bicep tear     right lower arm related to overgrowth of bone as stated per pt    KNEE ARTHROSCOPY     bilat   lithrotripsy     for kidney stones   LUMBAR LAMINECTOMY/DECOMPRESSION MICRODISCECTOMY N/A 09/07/2015   Procedure: MICRO LUMBAR DECOMPRESSION L4 - L5, REMOVAL OF SYNOVIAL CYST L4-L5;  Surgeon: Jene Every, MD;  Location: [facility];  Service: Orthopedics;  Laterality: N/A;   URETERAL STENT PLACEMENT     secondary to kidney stones/pt states had laser surgery prior     Family History  Problem Relation Age of Onset   Lung cancer Mother    Lung cancer Father    Diabetes Neg Hx     Social History   Socioeconomic History   Marital status: Married    Spouse name: Not on file   Number of children: Not on file   Years of education: Not on file   Highest education level: Not on file  Occupational History    Employer: [employer]   Tobacco Use  Smoking status: Never   Smokeless tobacco: Never  Substance and Sexual Activity   Alcohol use: No   Drug use: No   Sexual activity: Yes    Partners: Female  Other Topics Concern   Not on file  Social History Narrative   Not on file   Social Drivers of Health   Financial Resource Strain: Low Risk  (12/23/2023)   Overall Financial Resource Strain (CARDIA)    Difficulty of Paying Living Expenses: Not hard at all  Food Insecurity: No Food Insecurity (12/23/2023)   Hunger Vital Sign    Worried About Running Out of Food in the Last Year: Never true    Ran Out of Food in the Last Year: Never true  Transportation Needs: No Transportation Needs (12/23/2023)   PRAPARE - Administrator, Civil Service (Medical): No    Lack of Transportation (Non-Medical): No  Physical Activity: Sufficiently Active  (12/23/2023)   Exercise Vital Sign    Days of Exercise per Week: 4 days    Minutes of Exercise per Session: 40 min  Stress: No Stress Concern Present (12/23/2023)   [employer] of Occupational Health - Occupational Stress Questionnaire    Feeling of Stress : Not at all  Social Connections: Unknown (12/23/2023)   Social Connection and Isolation Panel [NHANES]    Frequency of Communication with Friends and Family: More than three times a week    Frequency of Social Gatherings with Friends and Family: More than three times a week    Attends Religious Services: Patient declined    Database administrator or Organizations: No    Attends Banker Meetings: Never    Marital Status: Widowed  Intimate Partner Violence: Not At Risk (03/21/2023)   Humiliation, Afraid, Rape, and Kick questionnaire    Fear of Current or Ex-Partner: No    Emotionally Abused: No    Physically Abused: No    Sexually Abused: No     Current Outpatient Medications:    acetaminophen (TYLENOL) 500 MG tablet, Tylenol Extra Strength, Disp: , Rfl:    allopurinol (ZYLOPRIM) 300 MG tablet, TAKE 1 TABLET BY MOUTH EVERY DAY, Disp: 90 tablet, Rfl: 1   aspirin EC 81 MG tablet, Take 81 mg by mouth daily. Swallow whole., Disp: , Rfl:    atorvastatin (LIPITOR) 40 MG tablet, Take 1 tablet (40 mg total) by mouth daily., Disp: 90 tablet, Rfl: 2   B-D 3CC LUER-LOK SYR 25GX1" 25G X 1" 3 ML MISC, USE 1 ML BY DOES NOT APPLY ROUTE EVERY 14 (FOURTEEN) DAYS. ( 1 AND 1/2 INCH ON BACKORDER), Disp: , Rfl:    BD PEN NEEDLE MICRO U/F 32G X 6 MM MISC, TO USE WITH INSULIN EVERY DAY, Disp: 100 each, Rfl: 3   colesevelam (WELCHOL) 625 MG tablet, TAKE 3 TABLETS (1,875 MG TOTAL) BY MOUTH DAILY., Disp: 270 tablet, Rfl: 0   dupilumab (DUPIXENT) 300 MG/2ML prefilled syringe, Inject 300 mg into the skin every 14 (fourteen) days., Disp: , Rfl:    finasteride (PROSCAR) 5 MG tablet, Take 5 mg by mouth daily., Disp: , Rfl:    glucose blood  (ONETOUCH VERIO) test strip, USE TO TEST SUGAR DAILY, Disp: 100 strip, Rfl: 3   Ibuprofen (ADVIL) 200 MG CAPS, , Disp: , Rfl:    insulin degludec (TRESIBA FLEXTOUCH) 200 UNIT/ML FlexTouch Pen, INJECT 60 UNITS INTO THE SKIN AT BEDTIME., Disp: 3 mL, Rfl: 5   JARDIANCE 25 MG TABS tablet, TAKE 1 TABLET BY  MOUTH EVERY DAY BEFORE BREAKFAST, Disp: 90 tablet, Rfl: 1   metFORMIN (GLUCOPHAGE-XR) 500 MG 24 hr tablet, TAKE 2 TABLETS BY MOUTH EVERY DAY WITH BREAKFAST, Disp: 180 tablet, Rfl: 1   omega-3 acid ethyl esters (LOVAZA) 1 g capsule, TAKE 2 CAPSULES BY MOUTH TWICE A DAY, Disp: 360 capsule, Rfl: 1   oxyCODONE-acetaminophen (PERCOCET) 10-325 MG tablet, Take 1 tablet by mouth every 4 (four) hours as needed for pain., Disp: 60 tablet, Rfl: 0   tamsulosin (FLOMAX) 0.4 MG CAPS capsule, Take 0.4 mg by mouth daily., Disp: , Rfl:    Allergies  Allergen Reactions   Bromocriptine Other (See Comments)    GI issues   Ciprofloxacin Other (See Comments)    "Tendon issues" Other reaction(s): other   Dulaglutide Other (See Comments) and Nausea Only    GI issues  CONSTITUTIONAL: Negative for chills, fatigue, fever, unintentional weight gain and unintentional weight loss.  E/N/T: Negative for ear pain, nasal congestion and sore throat.  CARDIOVASCULAR: Negative for chest pain, dizziness, palpitations and pedal edema.  RESPIRATORY: Negative for recent cough and dyspnea.  GASTROINTESTINAL: Negative for abdominal pain, acid reflux symptoms, constipation, diarrhea, nausea and vomiting.  MSK: see HPI INTEGUMENTARY: Negative for rash.  NEUROLOGICAL: Negative for dizziness and headaches.  PSYCHIATRIC: Negative for sleep disturbance and to question depression screen.  Negative for depression, negative for anhedonia.         Objective:  PHYSICAL EXAM:   VS: BP 130/70 (BP Location: Left Arm, Patient Position: Sitting, Cuff Size: Large)   Pulse 84   Temp (!) 97.3 F (36.3 C) (Temporal)   Resp 18   Ht 5\' 11"   (1.803 m)   Wt 262 lb 9.6 oz (119.1 kg)   SpO2 98%   BMI 36.63 kg/m   GEN: Well nourished, well developed, in no acute distress   Cardiac: RRR; no murmurs, rubs, or gallops,no edema -  Respiratory:  normal respiratory rate and pattern with no distress - normal breath sounds with no rales, rhonchi, wheezes or rubs  MS: no deformity or atrophy  Skin: warm and dry, no rash  Neuro:  Alert and Oriented x 3, - CN II-Xii grossly intact Psych: euthymic mood, appropriate affect and demeanor  Diabetic Foot Exam - Simple   No data filed      Health Maintenance Due  Topic Date Due   Diabetic kidney evaluation - Urine ACR  12/07/2023     There are no preventive care reminders to display for this patient.  Lab Results  Component Value Date   TSH 1.790 05/21/2023   Lab Results  Component Value Date   WBC 6.9 08/23/2023   HGB 17.0 08/23/2023   HCT 49.0 08/23/2023   MCV 94 08/23/2023   PLT 103 (L) 08/23/2023   Lab Results  Component Value Date   NA 139 08/23/2023   K 4.5 08/23/2023   CO2 22 08/23/2023   GLUCOSE 134 (H) 08/23/2023   BUN 23 08/23/2023   CREATININE 1.09 08/23/2023   BILITOT 1.7 (H) 08/23/2023   ALKPHOS 105 08/23/2023   AST 20 08/23/2023   ALT 30 08/23/2023   PROT 6.3 08/23/2023   ALBUMIN 4.5 08/23/2023   CALCIUM 9.5 08/23/2023   ANIONGAP 7 08/31/2015   EGFR 74 08/23/2023   GFR 47.85 (L) 03/09/2022   Lab Results  Component Value Date   CHOL 119 08/23/2023   Lab Results  Component Value Date   HDL 36 (L) 08/23/2023   Lab Results  Component  Value Date   LDLCALC 43 08/23/2023   Lab Results  Component Value Date   TRIG 256 (H) 08/23/2023   Lab Results  Component Value Date   CHOLHDL 3.3 08/23/2023   Lab Results  Component Value Date   HGBA1C 7.4 (H) 08/23/2023      Assessment & Plan:   Problem List Items Addressed This Visit       Endocrine   Type 2 diabetes mellitus with stage 3a chronic kidney disease, without long-term current  use of insulin - Primary   Relevant Medications   insulin degludec (TRESIBA FLEXTOUCH) 200 UNIT/ML FlexTouch Pen Continue current meds as directed Return in 4 months for fasting labs  Hyperlipidemia associated with diabetes (HCC) Continue meds Watch diet  BPH Continue meds  PSA pending  Psoriasis Continue med as directed Follow up with dermatology as directed  Chronic low back pain Continue with pain management  Hypertension associated with diabetes (HCC) Continue current meds     Other   Labwork pending   Follow-up: Return in about 4 months (around 04/26/2024) for chronic fasting follow-up.    SARA R Maksymilian Mabey, PA-C

## 2023-12-26 NOTE — Telephone Encounter (Signed)
 Patient confirmed to take Jardiance 25 mg daily. He also asked to [facility] his [facility]ills [facility]. He does not need any refills at the moment.

## 2023-12-27 LAB — COMPREHENSIVE METABOLIC PANEL
ALT: 30 IU/L (ref 0–44)
AST: 24 IU/L (ref 0–40)
Albumin: 4.4 g/dL (ref 3.9–4.9)
Alkaline Phosphatase: 128 IU/L — ABNORMAL HIGH (ref 44–121)
BUN/Creatinine Ratio: 15 (ref 10–24)
BUN: 16 mg/dL (ref 8–27)
Bilirubin Total: 1 mg/dL (ref 0.0–1.2)
CO2: 22 mmol/L (ref 20–29)
Calcium: 9.4 mg/dL (ref 8.6–10.2)
Chloride: 103 mmol/L (ref 96–106)
Creatinine, Ser: 1.08 mg/dL (ref 0.76–1.27)
Globulin, Total: 1.8 g/dL (ref 1.5–4.5)
Glucose: 126 mg/dL — ABNORMAL HIGH (ref 70–99)
Potassium: 4.4 mmol/L (ref 3.5–5.2)
Sodium: 140 mmol/L (ref 134–144)
Total Protein: 6.2 g/dL (ref 6.0–8.5)
eGFR: 75 mL/min/{1.73_m2} (ref >59)

## 2023-12-27 LAB — CBC WITH DIFFERENTIAL/PLATELET
Basophils Absolute: 0 10*3/uL (ref 0.0–0.2)
Basos: 0 %
EOS (ABSOLUTE): 0.1 10*3/uL (ref 0.0–0.4)
Eos: 2 %
Hematocrit: 46.6 % (ref 37.5–51.0)
Hemoglobin: 15.2 g/dL (ref 13.0–17.7)
Immature Grans (Abs): 0 10*3/uL (ref 0.0–0.1)
Immature Granulocytes: 0 %
Lymphocytes Absolute: 0.9 10*3/uL (ref 0.7–3.1)
Lymphs: 20 %
MCH: 29.5 pg (ref 26.6–33.0)
MCHC: 32.6 g/dL (ref 31.5–35.7)
MCV: 90 fL (ref 79–97)
Monocytes Absolute: 0.5 10*3/uL (ref 0.1–0.9)
Monocytes: 10 %
Neutrophils Absolute: 3.2 10*3/uL (ref 1.4–7.0)
Neutrophils: 68 %
Platelets: 153 10*3/uL (ref 150–450)
RBC: 5.16 x10E6/uL (ref 4.14–5.80)
RDW: 16.3 % — ABNORMAL HIGH (ref 11.6–15.4)
WBC: 4.7 10*3/uL (ref 3.4–10.8)

## 2023-12-27 LAB — LIPID PANEL
Chol/HDL Ratio: 3.7 ratio (ref 0.0–5.0)
Cholesterol, Total: 112 mg/dL (ref 100–199)
HDL: 30 mg/dL — ABNORMAL LOW (ref >39)
LDL Chol Calc (NIH): 42 mg/dL (ref 0–99)
Triglycerides: 256 mg/dL — ABNORMAL HIGH (ref 0–149)
VLDL Cholesterol Cal: 40 mg/dL (ref 5–40)

## 2023-12-27 LAB — PSA: Prostate Specific Ag, Serum: 2.4 ng/mL (ref 0.0–4.0)

## 2023-12-27 LAB — MICROALBUMIN / CREATININE URINE RATIO
Creatinine, Urine: 108.2 mg/dL
Microalb/Creat Ratio: 5 mg/g{creat} (ref 0–29)
Microalbumin, Urine: 4.9 ug/mL

## 2023-12-27 LAB — HEMOGLOBIN A1C
Est. average glucose Bld gHb Est-mCnc: 154 mg/dL
Hgb A1c MFr Bld: 7 % — ABNORMAL HIGH (ref 4.8–5.6)

## 2023-12-27 LAB — URIC ACID: Uric Acid: 3.9 mg/dL (ref 3.8–8.4)

## 2023-12-30 ENCOUNTER — Encounter: Payer: Self-pay | Admitting: Physician Assistant

## 2024-01-15 ENCOUNTER — Other Ambulatory Visit: Payer: Self-pay | Admitting: Physician Assistant

## 2024-01-26 ENCOUNTER — Other Ambulatory Visit: Payer: Self-pay | Admitting: Physician Assistant

## 2024-01-29 LAB — HM DIABETES EYE EXAM

## 2024-02-13 ENCOUNTER — Other Ambulatory Visit: Payer: Self-pay | Admitting: Physician Assistant

## 2024-04-02 ENCOUNTER — Ambulatory Visit

## 2024-04-02 VITALS — Ht 71.0 in | Wt 262.0 lb

## 2024-04-02 DIAGNOSIS — Z0000 Encounter for general adult medical examination without abnormal findings: Secondary | ICD-10-CM | POA: Diagnosis not present

## 2024-04-02 NOTE — Patient Instructions (Signed)
 Brent Duarte , Thank you for taking time out of your busy schedule to complete your Annual Wellness Visit with me. I enjoyed our conversation and look forward to speaking with you again next year. I, as well as your care team,  appreciate your ongoing commitment to your health goals. Please review the following plan we discussed and let me know if I can assist you in the future. Your Game plan/ To Do List    Follow up Visits: Next Medicare AWV with our clinical staff: In 1 year    Have you seen your provider in the last 6 months (3 months if uncontrolled diabetes)? Yes Next Office Visit with your provider: 04/30/24 @ 8:40  Clinician Recommendations:  Aim for 30 minutes of exercise or brisk walking, 6-8 glasses of water, and 5 servings of fruits and vegetables each day.       This is a list of the screening recommended for you and due dates:  Health Maintenance  Topic Date Due   COVID-19 Vaccine (7 - Pfizer risk 2024-25 season) 01/21/2024   Flu Shot  05/15/2024   Hemoglobin A1C  06/27/2024   Complete foot exam   08/22/2024   Yearly kidney function blood test for diabetes  12/25/2024   Yearly kidney health urinalysis for diabetes  12/25/2024   Eye exam for diabetics  01/28/2025   Medicare Annual Wellness Visit  04/02/2025   Colon Cancer Screening  08/30/2026   DTaP/Tdap/Td vaccine (2 - Td or Tdap) 08/17/2031   HPV Vaccine  Aged Out   Meningitis B Vaccine  Aged Out   Pneumococcal Vaccine for age over 56  Discontinued   Hepatitis C Screening  Discontinued   Zoster (Shingles) Vaccine  Discontinued    Advanced directives: (ACP Link)Information on Advanced Care Planning can be found at [place]  Secretary of [facility] Advance Health Care Directives Advance Health Care Directives. [email or web address]   Advance Care Planning is important because it:  [x]  Makes sure you receive the medical care that is consistent with your values, goals, and preferences  [x]  It provides guidance to your family and  loved ones and reduces their decisional burden about whether or not they are making the right decisions based on your wishes.  Follow the link provided in your after visit summary or read over the paperwork we have mailed to you to help you started getting your Advance Directives in place. If you need assistance in completing these, please reach out to us  so that we can help you!  See attachments for Preventive Care and Fall Prevention Tips.

## 2024-04-02 NOTE — Progress Notes (Signed)
 Subjective:   Brent Duarte is a 68 y.o. who presents for a Medicare Wellness preventive visit.  As a reminder, Annual Wellness Visits don't include a physical exam, and some assessments may be limited, especially if this visit is performed virtually. We may recommend an in-person follow-up visit with your provider if needed.  Visit Complete: Virtual I connected with  Alonso Jan on 04/02/24 by a audio enabled telemedicine application and verified that I am speaking with the correct person using two identifiers.  Patient Location: Home  Provider Location: Home Office  I discussed the limitations of evaluation and management by telemedicine. The patient expressed understanding and agreed to proceed.  Vital Signs: Because this visit was a virtual/telehealth visit, some criteria may be missing or patient reported. Any vitals not documented were not able to be obtained and vitals that have been documented are patient reported.  VideoDeclined- This patient declined Librarian, academic. Therefore the visit was completed with audio only.  Persons Participating in Visit: Patient.  AWV Questionnaire: Yes: Patient Medicare AWV questionnaire was completed by the patient on 03/31/24; I have confirmed that all information answered by patient is correct and no changes since this date.  Cardiac Risk Factors include: advanced age (>18men, >53 women);diabetes mellitus;dyslipidemia;hypertension;male gender     Objective:    Today's Vitals   04/02/24 0909  Weight: 262 lb (118.8 kg)  Height: 5' 11 (1.803 m)   Body mass index is 36.54 kg/m.     04/02/2024    9:14 AM 03/21/2023    1:42 PM 09/07/2015    1:50 PM 08/31/2015    8:16 AM  Advanced Directives  Does Patient Have a Medical Advance Directive? No No No  No   Would patient like information on creating a medical advance directive? Yes (MAU/Ambulatory/Procedural Areas - Information given) No - Patient declined No  - patient declined information  No - patient declined information      Data saved with a previous flowsheet row definition    Current Medications (verified) Outpatient Encounter Medications as of 04/02/2024  Medication Sig   acetaminophen  (TYLENOL ) 500 MG tablet Tylenol  Extra Strength   allopurinol  (ZYLOPRIM ) 300 MG tablet TAKE 1 TABLET BY MOUTH EVERY DAY   aspirin EC 81 MG tablet Take 81 mg by mouth daily. Swallow whole.   atorvastatin  (LIPITOR) 40 MG tablet TAKE 1 TABLET BY MOUTH EVERY DAY   B-D 3CC LUER-LOK SYR 25GX1 25G X 1 3 ML MISC USE 1 ML BY DOES NOT APPLY ROUTE EVERY 14 (FOURTEEN) DAYS. ( 1 AND 1/2 INCH ON BACKORDER)   BD PEN NEEDLE MICRO U/F 32G X 6 MM MISC TO USE WITH INSULIN  EVERY DAY   colesevelam  (WELCHOL ) 625 MG tablet TAKE 3 TABLETS (1,875 MG TOTAL) BY MOUTH DAILY.   dupilumab (DUPIXENT) 300 MG/2ML prefilled syringe Inject 300 mg into the skin every 14 (fourteen) days.   finasteride (PROSCAR) 5 MG tablet Take 5 mg by mouth daily.   glucose blood (ONETOUCH VERIO) test strip USE TO TEST SUGAR DAILY   Ibuprofen (ADVIL) 200 MG CAPS    insulin  degludec (TRESIBA  FLEXTOUCH) 200 UNIT/ML FlexTouch Pen INJECT 60 UNITS INTO THE SKIN AT BEDTIME.   JARDIANCE  25 MG TABS tablet TAKE 1 TABLET BY MOUTH EVERY DAY BEFORE BREAKFAST   metFORMIN  (GLUCOPHAGE -XR) 500 MG 24 hr tablet TAKE 2 TABLETS BY MOUTH EVERY DAY WITH BREAKFAST   omega-3 acid ethyl esters (LOVAZA ) 1 g capsule TAKE 2 CAPSULES BY MOUTH TWICE  A DAY   oxyCODONE -acetaminophen  (PERCOCET) 10-325 MG tablet Take 1 tablet by mouth every 4 (four) hours as needed for pain.   tamsulosin (FLOMAX) 0.4 MG CAPS capsule Take 0.4 mg by mouth daily.   No facility-administered encounter medications on file as of 04/02/2024.    Allergies (verified) Bromocriptine , Ciprofloxacin, and Dulaglutide   History: Past Medical History:  Diagnosis Date   Arthritis    BPH without obstruction/lower urinary tract symptoms 02/01/2020   History of chicken  pox    History of kidney stones    History of measles    History of mumps    Hyperlipidemia    Idiopathic gout of right knee 02/01/2020   Imbalance    secondary to back issues   Multiple gastric ulcers    Numbness and tingling of both legs    Pneumonia    Psoriasis vulgaris 02/01/2020   Recurrent falls    Past Surgical History:  Procedure Laterality Date   bicep tear     right lower arm related to overgrowth of bone as stated per pt    KNEE ARTHROSCOPY     bilat   lithrotripsy     for kidney stones   LUMBAR LAMINECTOMY/DECOMPRESSION MICRODISCECTOMY N/A 09/07/2015   Procedure: MICRO LUMBAR DECOMPRESSION L4 - L5, REMOVAL OF SYNOVIAL CYST L4-L5;  Surgeon: Orvan Blanch, MD;  Location: [facility];  Service: Orthopedics;  Laterality: N/A;   URETERAL STENT PLACEMENT     secondary to kidney stones/pt states had laser surgery prior    Family History  Problem Relation Age of Onset   Lung cancer Mother    Lung cancer Father    Diabetes Neg Hx    Social History   Socioeconomic History   Marital status: Married    Spouse name: Not on file   Number of children: Not on file   Years of education: Not on file   Highest education level: Not on file  Occupational History    Employer: [employer]   Tobacco Use   Smoking status: Never   Smokeless tobacco: Never  Substance and Sexual Activity   Alcohol use: No   Drug use: No   Sexual activity: Yes    Partners: Female  Other Topics Concern   Not on file  Social History Narrative   Not on file   Social Drivers of Health   Financial Resource Strain: Low Risk  (03/31/2024)   Overall Financial Resource Strain (CARDIA)    Difficulty of Paying Living Expenses: Not hard at all  Food Insecurity: No Food Insecurity (03/31/2024)   Hunger Vital Sign    Worried About Running Out of Food in the Last Year: Never true    Ran Out of Food in the Last Year: Never true  Transportation Needs: No Transportation Needs (03/31/2024)   PRAPARE -  Administrator, Civil Service (Medical): No    Lack of Transportation (Non-Medical): No  Physical Activity: Insufficiently Active (03/31/2024)   Exercise Vital Sign    Days of Exercise per Week: 2 days    Minutes of Exercise per Session: 30 min  Stress: No Stress Concern Present (03/31/2024)   [employer] of Occupational Health - Occupational Stress Questionnaire    Feeling of Stress: Not at all  Social Connections: Socially Isolated (03/31/2024)   Social Connection and Isolation Panel    Frequency of Communication with Friends and Family: More than three times a week    Frequency of Social Gatherings with Friends and  Family: Twice a week    Attends Religious Services: Patient declined    Active Member of Clubs or Organizations: No    Attends Banker Meetings: Never    Marital Status: Widowed    Tobacco Counseling Counseling given: Not Answered    Clinical Intake:  Pre-visit preparation completed: Yes  Pain : No/denies pain     Diabetes: Yes CBG done?: No Did pt. bring in CBG monitor from home?: No  Lab Results  Component Value Date   HGBA1C 7.0 (H) 12/26/2023   HGBA1C 7.4 (H) 08/23/2023   HGBA1C 7.1 (H) 05/21/2023     How often do you need to have someone help you when you read instructions, pamphlets, or other written materials from your doctor or pharmacy?: 1 - Never  Interpreter Needed?: No  Information entered by :: Seabron Cypress LPN   Activities of Daily Living     03/31/2024    7:04 AM  In your present state of health, do you have any difficulty performing the following activities:  Hearing? 0  Vision? 0  Difficulty concentrating or making decisions? 0  Walking or climbing stairs? 0  Dressing or bathing? 0  Doing errands, shopping? 0  Preparing Food and eating ? N  Using the Toilet? N  In the past six months, have you accidently leaked urine? N  Do you have problems with loss of bowel control? N  Managing your  Medications? N  Managing your Finances? N  Housekeeping or managing your Housekeeping? N    Patient Care Team: Cyndi Drain, Kirby Peoples as PCP - General (Physician Assistant) Adelaide Adjutant, MD as Consulting Physician (Physical Medicine and Rehabilitation) Pa, [facility] Nan Aver, MD as Consulting Physician (Nephrology)  I have updated your Care Teams any recent Medical Services you may have received from other providers in the past year.     Assessment:   This is a routine wellness examination for Tydarius.  Hearing/Vision screen Hearing Screening - Comments:: Denies hearing difficulties   Vision Screening - Comments:: up to date with routine eye exams with [facility]     Goals Addressed             This Visit's Progress    Remain active and independent   On track      Depression Screen     04/02/2024    9:13 AM 12/26/2023    8:09 AM 08/23/2023    8:58 AM 05/20/2023    8:06 AM 03/21/2023    1:43 PM 02/07/2023    8:33 AM 10/04/2022   10:39 AM  PHQ 2/9 Scores  PHQ - 2 Score 0 0 0 0 0 0 0  PHQ- 9 Score   2 3 0 1 3    Fall Risk     03/31/2024    7:04 AM 12/26/2023    8:09 AM 08/23/2023    8:57 AM 03/21/2023    1:42 PM 12/25/2022    2:38 PM  Fall Risk   Falls in the past year? 0 0 0 0 0  Number falls in past yr: 0 0 0 0 0  Injury with Fall? 0 0 0 0 0  Risk for fall due to : No Fall Risks No Fall Risks No Fall Risks No Fall Risks No Fall Risks  Follow up Falls prevention discussed;Education provided;Falls evaluation completed Falls evaluation completed Falls evaluation completed Falls prevention discussed Falls evaluation completed    MEDICARE RISK AT HOME:  Medicare Risk  at Home Any stairs in or around the home?: (Patient-Rptd) Yes If so, are there any without handrails?: (Patient-Rptd) No Home free of loose throw rugs in walkways, pet beds, electrical cords, etc?: (Patient-Rptd) Yes Adequate lighting in your home to reduce risk of falls?:  (Patient-Rptd) Yes Life alert?: (Patient-Rptd) No Use of a cane, walker or w/c?: (Patient-Rptd) No Grab bars in the bathroom?: (Patient-Rptd) No Shower chair or bench in shower?: (Patient-Rptd) No Elevated toilet seat or a handicapped toilet?: (Patient-Rptd) Yes  TIMED UP AND GO:  Was the test performed?  No  Cognitive Function: 6CIT completed        04/02/2024    9:15 AM 03/21/2023    1:44 PM  6CIT Screen  What Year? 0 points 0 points  What month? 0 points 0 points  What time? 0 points 0 points  Count back from 20 0 points 0 points  Months in reverse 0 points 0 points  Repeat phrase 0 points 2 points  Total Score 0 points 2 points    Immunizations Immunization History  Administered Date(s) Administered   Fluad Quad(high Dose 65+) 07/17/2021, 08/03/2022   Fluad Trivalent(High Dose 65+) 07/01/2023   Influenza Inj Mdck Quad Pf 07/27/2020   PFIZER(Purple Top)SARS-COV-2 Vaccination 02/29/2020, 03/21/2020, 10/05/2020   Pfizer Covid-19 Vaccine Bivalent Booster 4yrs & up 07/17/2021   Pfizer(Comirnaty)Fall Seasonal Vaccine 12 years and older 08/03/2022, 07/23/2023   Tdap 08/16/2021    Screening Tests Health Maintenance  Topic Date Due   COVID-19 Vaccine (7 - Pfizer risk 2024-25 season) 01/21/2024   INFLUENZA VACCINE  05/15/2024   HEMOGLOBIN A1C  06/27/2024   FOOT EXAM  08/22/2024   Diabetic kidney evaluation - eGFR measurement  12/25/2024   Diabetic kidney evaluation - Urine ACR  12/25/2024   OPHTHALMOLOGY EXAM  01/28/2025   Medicare Annual Wellness (AWV)  04/02/2025   Colonoscopy  08/30/2026   DTaP/Tdap/Td (2 - Td or Tdap) 08/17/2031   HPV VACCINES  Aged Out   Meningococcal B Vaccine  Aged Out   Pneumococcal Vaccine: 50+ Years  Discontinued   Hepatitis C Screening  Discontinued   Zoster Vaccines- Shingrix  Discontinued    Health Maintenance  Health Maintenance Due  Topic Date Due   COVID-19 Vaccine (7 - Pfizer risk 2024-25 season) 01/21/2024     Additional  Screening:  Vision Screening: Recommended annual ophthalmology exams for early detection of glaucoma and other disorders of the eye. Would you like a referral to an eye doctor? No    Dental Screening: Recommended annual dental exams for proper oral hygiene  Community Resource Referral / Chronic Care Management: CRR required this visit?  No   CCM required this visit?  No   Plan:    I have personally reviewed and noted the following in the patient's chart:   Medical and social history Use of alcohol, tobacco or illicit drugs  Current medications and supplements including opioid prescriptions. Patient is currently taking opioid prescriptions. Information provided to patient regarding non-opioid alternatives. Patient advised to discuss non-opioid treatment plan with their provider. Functional ability and status Nutritional status Physical activity Advanced directives List of other physicians Hospitalizations, surgeries, and ER visits in previous 12 months Vitals Screenings to include cognitive, depression, and falls Referrals and appointments  In addition, I have reviewed and discussed with patient certain preventive protocols, quality metrics, and best practice recommendations. A written personalized care plan for preventive services as well as general preventive health recommendations were provided to patient.   Seabron Cypress  Stanton Earthly, LPN   [id number]   After Visit Summary: (MyChart) Due to this being a telephonic visit, the after visit summary with patients personalized plan was offered to patient via MyChart   Notes: Nothing significant to report at this time.

## 2024-04-12 ENCOUNTER — Other Ambulatory Visit: Payer: Self-pay | Admitting: Physician Assistant

## 2024-04-12 DIAGNOSIS — N1831 Chronic kidney disease, stage 3a: Secondary | ICD-10-CM

## 2024-04-30 ENCOUNTER — Ambulatory Visit (INDEPENDENT_AMBULATORY_CARE_PROVIDER_SITE_OTHER): Admitting: Physician Assistant

## 2024-04-30 ENCOUNTER — Encounter: Payer: Self-pay | Admitting: Physician Assistant

## 2024-04-30 VITALS — BP 136/88 | HR 70 | Temp 97.1°F | Ht 71.0 in | Wt 270.0 lb

## 2024-04-30 DIAGNOSIS — N1831 Chronic kidney disease, stage 3a: Secondary | ICD-10-CM

## 2024-04-30 DIAGNOSIS — L400 Psoriasis vulgaris: Secondary | ICD-10-CM

## 2024-04-30 DIAGNOSIS — E1122 Type 2 diabetes mellitus with diabetic chronic kidney disease: Secondary | ICD-10-CM

## 2024-04-30 DIAGNOSIS — E66812 Obesity, class 2: Secondary | ICD-10-CM

## 2024-04-30 DIAGNOSIS — Z6837 Body mass index (BMI) 37.0-37.9, adult: Secondary | ICD-10-CM

## 2024-04-30 DIAGNOSIS — Z8739 Personal history of other diseases of the musculoskeletal system and connective tissue: Secondary | ICD-10-CM

## 2024-04-30 DIAGNOSIS — E1159 Type 2 diabetes mellitus with other circulatory complications: Secondary | ICD-10-CM

## 2024-04-30 DIAGNOSIS — E1169 Type 2 diabetes mellitus with other specified complication: Secondary | ICD-10-CM | POA: Diagnosis not present

## 2024-04-30 DIAGNOSIS — I152 Hypertension secondary to endocrine disorders: Secondary | ICD-10-CM

## 2024-04-30 DIAGNOSIS — E785 Hyperlipidemia, unspecified: Secondary | ICD-10-CM

## 2024-04-30 NOTE — Progress Notes (Addendum)
 Established Patient Office Visit  Subjective:  Patient ID: Brent Duarte, male    DOB: 03-28-1956  Age: 68 y.o. MRN: [id number]  CC:  Chief Complaint  Patient presents with   Medical Management of Chronic Issues    HPI Brent Duarte presents for follow up of diabetes Pt here for follow up of diabetes - overall doing well except does have elevated readings when drinking lots of chocolate milk -- he has had no low readings -  He is currently taking jardiance  25mg  qd, tresiba  56 units daily, and metformin  500mg  bid He is on a statin  - lipitor 40mg  qd  Pt is up to date on eye exam  Pt has history of BPH and has seen urology -  He states that he has been advised to have another biopsy of his prostate because his 'levels are high (assume PSA) but pt is refusing to have done  He is on proscar and has also  flomax - last PSA normal 3/25 He has not had urology follow up appt recently  Pt follows with pain management for chronic low back pain with Dr Bonner - he has received injections in the past which have been helpful but has not had one in past few months  Currently on oxycodone  prn  Pt follows with dermatology for psoriasis - currently on dupixent and doing well  Pt with chronic kidney disease stage 3b - sees Dr Jerrye every 6 months  Mixed hyperlipidemia  Pt presents with hyperlipidemia. . Compliance with treatment has been good -The patient is compliant with medications, maintains a low cholesterol diet , follows up as directed ,. The patient denies experiencing any hypercholesterolemia related symptoms. Currently taking lipitor 40mg  qd, welchol  and lovaza   Pt with history of gout - takes allopurinol  300mg  Past Medical History:  Diagnosis Date   Arthritis    BPH without obstruction/lower urinary tract symptoms 02/01/2020   History of chicken pox    History of kidney stones    History of measles    History of mumps    Hyperlipidemia    Idiopathic gout of right knee 02/01/2020    Imbalance    secondary to back issues   Multiple gastric ulcers    Numbness and tingling of both legs    Pneumonia    Psoriasis vulgaris 02/01/2020   Recurrent falls     Past Surgical History:  Procedure Laterality Date   bicep tear     right lower arm related to overgrowth of bone as stated per pt    KNEE ARTHROSCOPY     bilat   lithrotripsy     for kidney stones   LUMBAR LAMINECTOMY/DECOMPRESSION MICRODISCECTOMY N/A 09/07/2015   Procedure: MICRO LUMBAR DECOMPRESSION L4 - L5, REMOVAL OF SYNOVIAL CYST L4-L5;  Surgeon: Reyes Billing, MD;  Location: [facility];  Service: Orthopedics;  Laterality: N/A;   URETERAL STENT PLACEMENT     secondary to kidney stones/pt states had laser surgery prior     Family History  Problem Relation Age of Onset   Lung cancer Mother    Lung cancer Father    Diabetes Neg Hx     Social History   Socioeconomic History   Marital status: Married    Spouse name: Not on file   Number of children: Not on file   Years of education: Not on file   Highest education level: Not on file  Occupational History    Employer: [employer]   Tobacco  Use   Smoking status: Never   Smokeless tobacco: Never  Substance and Sexual Activity   Alcohol use: No   Drug use: No   Sexual activity: Yes    Partners: Female  Other Topics Concern   Not on file  Social History Narrative   Not on file   Social Drivers of Health   Financial Resource Strain: Low Risk  (03/31/2024)   Overall Financial Resource Strain (CARDIA)    Difficulty of Paying Living Expenses: Not hard at all  Food Insecurity: No Food Insecurity (03/31/2024)   Hunger Vital Sign    Worried About Running Out of Food in the Last Year: Never true    Ran Out of Food in the Last Year: Never true  Transportation Needs: No Transportation Needs (03/31/2024)   PRAPARE - Administrator, Civil Service (Medical): No    Lack of Transportation (Non-Medical): No  Physical Activity: Insufficiently  Active (03/31/2024)   Exercise Vital Sign    Days of Exercise per Week: 2 days    Minutes of Exercise per Session: 30 min  Stress: No Stress Concern Present (03/31/2024)   [employer] of Occupational Health - Occupational Stress Questionnaire    Feeling of Stress: Not at all  Social Connections: Socially Isolated (03/31/2024)   Social Connection and Isolation Panel    Frequency of Communication with Friends and Family: More than three times a week    Frequency of Social Gatherings with Friends and Family: Twice a week    Attends Religious Services: Patient declined    Database administrator or Organizations: No    Attends Banker Meetings: Never    Marital Status: Widowed  Intimate Partner Violence: Not At Risk (04/02/2024)   Humiliation, Afraid, Rape, and Kick questionnaire    Fear of Current or Ex-Partner: No    Emotionally Abused: No    Physically Abused: No    Sexually Abused: No     Current Outpatient Medications:    acetaminophen  (TYLENOL ) 500 MG tablet, Tylenol  Extra Strength, Disp: , Rfl:    allopurinol  (ZYLOPRIM ) 300 MG tablet, TAKE 1 TABLET BY MOUTH EVERY DAY, Disp: 90 tablet, Rfl: 1   aspirin EC 81 MG tablet, Take 81 mg by mouth daily. Swallow whole., Disp: , Rfl:    atorvastatin  (LIPITOR) 40 MG tablet, TAKE 1 TABLET BY MOUTH EVERY DAY, Disp: 90 tablet, Rfl: 1   B-D 3CC LUER-LOK SYR 25GX1 25G X 1 3 ML MISC, USE 1 ML BY DOES NOT APPLY ROUTE EVERY 14 (FOURTEEN) DAYS. ( 1 AND 1/2 INCH ON BACKORDER), Disp: , Rfl:    BD PEN NEEDLE MICRO U/F 32G X 6 MM MISC, TO USE WITH INSULIN  EVERY DAY, Disp: 100 each, Rfl: 3   colesevelam  (WELCHOL ) 625 MG tablet, TAKE 3 TABLETS (1,875 MG TOTAL) BY MOUTH DAILY., Disp: 270 tablet, Rfl: 0   dupilumab (DUPIXENT) 300 MG/2ML prefilled syringe, Inject 300 mg into the skin every 14 (fourteen) days., Disp: , Rfl:    finasteride (PROSCAR) 5 MG tablet, Take 5 mg by mouth daily., Disp: , Rfl:    glucose blood (ONETOUCH VERIO) test  strip, USE TO TEST SUGAR DAILY, Disp: 100 strip, Rfl: 3   Ibuprofen (ADVIL) 200 MG CAPS, , Disp: , Rfl:    insulin  degludec (TRESIBA  FLEXTOUCH) 200 UNIT/ML FlexTouch Pen, INJECT 60 UNITS INTO THE SKIN AT BEDTIME., Disp: 9 mL, Rfl: 5   JARDIANCE  25 MG TABS tablet, TAKE 1 TABLET BY MOUTH EVERY DAY  BEFORE BREAKFAST, Disp: 90 tablet, Rfl: 1   metFORMIN  (GLUCOPHAGE -XR) 500 MG 24 hr tablet, TAKE 2 TABLETS BY MOUTH EVERY DAY WITH BREAKFAST, Disp: 180 tablet, Rfl: 1   omega-3 acid ethyl esters (LOVAZA ) 1 g capsule, TAKE 2 CAPSULES BY MOUTH TWICE A DAY, Disp: 360 capsule, Rfl: 1   oxyCODONE -acetaminophen  (PERCOCET) 10-325 MG tablet, Take 1 tablet by mouth every 4 (four) hours as needed for pain., Disp: 60 tablet, Rfl: 0   tamsulosin (FLOMAX) 0.4 MG CAPS capsule, Take 0.4 mg by mouth daily., Disp: , Rfl:    Allergies  Allergen Reactions   Bromocriptine  Other (See Comments)    GI issues   Ciprofloxacin Other (See Comments)    Tendon issues Other reaction(s): other   Dulaglutide Other (See Comments) and Nausea Only    GI issues  CONSTITUTIONAL: Negative for chills, fatigue, fever E/N/T: Negative for ear pain, nasal congestion and sore throat.  CARDIOVASCULAR: Negative for chest pain, dizziness, palpitations and pedal edema.  RESPIRATORY: Negative for recent cough and dyspnea.  GASTROINTESTINAL: Negative for abdominal pain, acid reflux symptoms, constipation, diarrhea, nausea and vomiting.  MSK: Negative for arthralgias and myalgias.  INTEGUMENTARY: Negative for rash.  NEUROLOGICAL: Negative for dizziness and headaches.  PSYCHIATRIC: Negative for sleep disturbance and to question depression screen.  Negative for depression, negative for anhedonia.         Objective:  PHYSICAL EXAM:   VS: BP 136/88 (BP Location: Left Arm, Patient Position: Sitting)   Pulse 70   Temp (!) 97.1 F (36.2 C) (Temporal)   Ht 5' 11 (1.803 m)   Wt 270 lb (122.5 kg)   SpO2 97%   BMI 37.66 kg/m   GEN: Well  nourished, well developed, in no acute distress   Cardiac: RRR; no murmurs, rubs, or gallops,no edema -  Respiratory:  normal respiratory rate and pattern with no distress - normal breath sounds with no rales, rhonchi, wheezes or rubs  MS: no deformity or atrophy  Skin: warm and dry, no rash  Neuro:  Alert and Oriented x 3,  - CN II-Xii grossly intact Psych: euthymic mood, appropriate affect and demeanor    Diabetic Foot Exam - Simple   No data filed      There are no preventive care reminders to display for this patient.    There are no preventive care reminders to display for this patient.  Lab Results  Component Value Date   TSH 1.790 05/21/2023   Lab Results  Component Value Date   WBC 4.7 12/26/2023   HGB 15.2 12/26/2023   HCT 46.6 12/26/2023   MCV 90 12/26/2023   PLT 153 12/26/2023   Lab Results  Component Value Date   NA 140 12/26/2023   K 4.4 12/26/2023   CO2 22 12/26/2023   GLUCOSE 126 (H) 12/26/2023   BUN 16 12/26/2023   CREATININE 1.08 12/26/2023   BILITOT 1.0 12/26/2023   ALKPHOS 128 (H) 12/26/2023   AST 24 12/26/2023   ALT 30 12/26/2023   PROT 6.2 12/26/2023   ALBUMIN 4.4 12/26/2023   CALCIUM  9.4 12/26/2023   ANIONGAP 7 08/31/2015   EGFR 75 12/26/2023   GFR 47.85 (L) 03/09/2022   Lab Results  Component Value Date   CHOL 112 12/26/2023   Lab Results  Component Value Date   HDL 30 (L) 12/26/2023   Lab Results  Component Value Date   LDLCALC 42 12/26/2023   Lab Results  Component Value Date   TRIG 256 (H) 12/26/2023  Lab Results  Component Value Date   CHOLHDL 3.7 12/26/2023   Lab Results  Component Value Date   HGBA1C 7.0 (H) 12/26/2023      Assessment & Plan:   Problem List Items Addressed This Visit       Endocrine   Type 2 diabetes mellitus with stage 3a chronic kidney disease, without long-term current use of insulin  - Primary   Relevant Medications   insulin  degludec (TRESIBA  FLEXTOUCH) 200 UNIT/ML FlexTouch  Pen Continue current meds as directed Return in 4 months for fasting labs  Hyperlipidemia associated with diabetes (HCC) Continue meds Watch diet  BPH Continue meds    Psoriasis Continue med as directed Follow up with dermatology as directed  Chronic low back pain Continue with pain management  Hypertension associated with diabetes (HCC) Continue current meds     Other Class 2 severe obesity due to excess calories with serious complication with BMI 37.0-37.9 Efforts at watching diet/exercise   Labwork pending   Follow-up: Return in about 4 months (around 08/31/2024) for chronic fasting follow-up.    SARA R Illeana Edick, PA-C

## 2024-05-01 ENCOUNTER — Ambulatory Visit: Payer: Self-pay | Admitting: Physician Assistant

## 2024-05-01 LAB — COMPREHENSIVE METABOLIC PANEL WITH GFR
ALT: 19 IU/L (ref 0–44)
AST: 18 IU/L (ref 0–40)
Albumin: 4.3 g/dL (ref 3.9–4.9)
Alkaline Phosphatase: 112 IU/L (ref 44–121)
BUN/Creatinine Ratio: 21 (ref 10–24)
BUN: 23 mg/dL (ref 8–27)
Bilirubin Total: 0.6 mg/dL (ref 0.0–1.2)
CO2: 19 mmol/L — ABNORMAL LOW (ref 20–29)
Calcium: 9.3 mg/dL (ref 8.6–10.2)
Chloride: 102 mmol/L (ref 96–106)
Creatinine, Ser: 1.11 mg/dL (ref 0.76–1.27)
Globulin, Total: 1.8 g/dL (ref 1.5–4.5)
Glucose: 149 mg/dL — ABNORMAL HIGH (ref 70–99)
Potassium: 4.4 mmol/L (ref 3.5–5.2)
Sodium: 138 mmol/L (ref 134–144)
Total Protein: 6.1 g/dL (ref 6.0–8.5)
eGFR: 72 mL/min/1.73 (ref >59)

## 2024-05-01 LAB — CBC WITH DIFFERENTIAL/PLATELET
Basophils Absolute: 0 x10E3/uL (ref 0.0–0.2)
Basos: 0 %
EOS (ABSOLUTE): 0.1 x10E3/uL (ref 0.0–0.4)
Eos: 3 %
Hematocrit: 47 % (ref 37.5–51.0)
Hemoglobin: 15.4 g/dL (ref 13.0–17.7)
Immature Grans (Abs): 0 x10E3/uL (ref 0.0–0.1)
Immature Granulocytes: 0 %
Lymphocytes Absolute: 1.1 x10E3/uL (ref 0.7–3.1)
Lymphs: 23 %
MCH: 30.6 pg (ref 26.6–33.0)
MCHC: 32.8 g/dL (ref 31.5–35.7)
MCV: 93 fL (ref 79–97)
Monocytes Absolute: 0.4 x10E3/uL (ref 0.1–0.9)
Monocytes: 8 %
Neutrophils Absolute: 3.2 x10E3/uL (ref 1.4–7.0)
Neutrophils: 65 %
Platelets: 128 x10E3/uL — ABNORMAL LOW (ref 150–450)
RBC: 5.03 x10E6/uL (ref 4.14–5.80)
RDW: 15.6 % — ABNORMAL HIGH (ref 11.6–15.4)
WBC: 4.9 x10E3/uL (ref 3.4–10.8)

## 2024-05-01 LAB — LIPID PANEL
Chol/HDL Ratio: 4.2 ratio (ref 0.0–5.0)
Cholesterol, Total: 122 mg/dL (ref 100–199)
HDL: 29 mg/dL — ABNORMAL LOW (ref >39)
LDL Chol Calc (NIH): 44 mg/dL (ref 0–99)
Triglycerides: 322 mg/dL — ABNORMAL HIGH (ref 0–149)
VLDL Cholesterol Cal: 49 mg/dL — ABNORMAL HIGH (ref 5–40)

## 2024-05-01 LAB — URIC ACID: Uric Acid: 3.3 mg/dL — ABNORMAL LOW (ref 3.8–8.4)

## 2024-05-01 LAB — HEMOGLOBIN A1C
Est. average glucose Bld gHb Est-mCnc: 189 mg/dL
Hgb A1c MFr Bld: 8.2 % — ABNORMAL HIGH (ref 4.8–5.6)

## 2024-05-01 LAB — TSH: TSH: 1.92 u[IU]/mL (ref 0.450–4.500)

## 2024-05-06 ENCOUNTER — Other Ambulatory Visit: Payer: Self-pay | Admitting: Physician Assistant

## 2024-05-22 ENCOUNTER — Other Ambulatory Visit: Payer: Self-pay | Admitting: Physician Assistant

## 2024-05-24 ENCOUNTER — Other Ambulatory Visit: Payer: Self-pay | Admitting: Physician Assistant

## 2024-05-24 DIAGNOSIS — E1165 Type 2 diabetes mellitus with hyperglycemia: Secondary | ICD-10-CM

## 2024-06-06 ENCOUNTER — Other Ambulatory Visit: Payer: Self-pay | Admitting: Physician Assistant

## 2024-06-06 DIAGNOSIS — E782 Mixed hyperlipidemia: Secondary | ICD-10-CM

## 2024-07-15 ENCOUNTER — Other Ambulatory Visit: Payer: Self-pay | Admitting: Physician Assistant

## 2024-07-21 ENCOUNTER — Other Ambulatory Visit: Payer: Self-pay | Admitting: Physician Assistant

## 2024-08-07 ENCOUNTER — Other Ambulatory Visit: Payer: Self-pay | Admitting: Physician Assistant

## 2024-08-31 ENCOUNTER — Other Ambulatory Visit: Payer: Self-pay | Admitting: Physician Assistant

## 2024-08-31 DIAGNOSIS — E1159 Type 2 diabetes mellitus with other circulatory complications: Secondary | ICD-10-CM

## 2024-08-31 DIAGNOSIS — E1169 Type 2 diabetes mellitus with other specified complication: Secondary | ICD-10-CM

## 2024-08-31 DIAGNOSIS — Z8739 Personal history of other diseases of the musculoskeletal system and connective tissue: Secondary | ICD-10-CM

## 2024-08-31 DIAGNOSIS — E1122 Type 2 diabetes mellitus with diabetic chronic kidney disease: Secondary | ICD-10-CM

## 2024-09-01 ENCOUNTER — Other Ambulatory Visit

## 2024-09-01 DIAGNOSIS — E1169 Type 2 diabetes mellitus with other specified complication: Secondary | ICD-10-CM

## 2024-09-01 DIAGNOSIS — Z8739 Personal history of other diseases of the musculoskeletal system and connective tissue: Secondary | ICD-10-CM

## 2024-09-01 DIAGNOSIS — I152 Hypertension secondary to endocrine disorders: Secondary | ICD-10-CM

## 2024-09-01 DIAGNOSIS — E1122 Type 2 diabetes mellitus with diabetic chronic kidney disease: Secondary | ICD-10-CM

## 2024-09-02 ENCOUNTER — Ambulatory Visit: Payer: Self-pay | Admitting: Physician Assistant

## 2024-09-02 LAB — CBC WITH DIFFERENTIAL/PLATELET
Basophils Absolute: 0 x10E3/uL (ref 0.0–0.2)
Basos: 0 %
EOS (ABSOLUTE): 0.1 x10E3/uL (ref 0.0–0.4)
Eos: 2 %
Hematocrit: 47.7 % (ref 37.5–51.0)
Hemoglobin: 16.2 g/dL (ref 13.0–17.7)
Immature Grans (Abs): 0 x10E3/uL (ref 0.0–0.1)
Immature Granulocytes: 0 %
Lymphocytes Absolute: 1 x10E3/uL (ref 0.7–3.1)
Lymphs: 22 %
MCH: 31.5 pg (ref 26.6–33.0)
MCHC: 34 g/dL (ref 31.5–35.7)
MCV: 93 fL (ref 79–97)
Monocytes Absolute: 0.3 x10E3/uL (ref 0.1–0.9)
Monocytes: 8 %
Neutrophils Absolute: 3.1 x10E3/uL (ref 1.4–7.0)
Neutrophils: 67 %
Platelets: 120 x10E3/uL — ABNORMAL LOW (ref 150–450)
RBC: 5.15 x10E6/uL (ref 4.14–5.80)
RDW: 15.7 % — ABNORMAL HIGH (ref 11.6–15.4)
WBC: 4.6 x10E3/uL (ref 3.4–10.8)

## 2024-09-02 LAB — COMPREHENSIVE METABOLIC PANEL WITH GFR
ALT: 22 IU/L (ref 0–44)
AST: 15 IU/L (ref 0–40)
Albumin: 4.4 g/dL (ref 3.9–4.9)
Alkaline Phosphatase: 122 IU/L (ref 47–123)
BUN/Creatinine Ratio: 24 (ref 10–24)
BUN: 27 mg/dL (ref 8–27)
Bilirubin Total: 0.9 mg/dL (ref 0.0–1.2)
CO2: 22 mmol/L (ref 20–29)
Calcium: 9.5 mg/dL (ref 8.6–10.2)
Chloride: 100 mmol/L (ref 96–106)
Creatinine, Ser: 1.12 mg/dL (ref 0.76–1.27)
Globulin, Total: 2.1 g/dL (ref 1.5–4.5)
Glucose: 184 mg/dL — ABNORMAL HIGH (ref 70–99)
Potassium: 4.3 mmol/L (ref 3.5–5.2)
Sodium: 139 mmol/L (ref 134–144)
Total Protein: 6.5 g/dL (ref 6.0–8.5)
eGFR: 72 mL/min/1.73 (ref >59)

## 2024-09-02 LAB — LIPID PANEL
Chol/HDL Ratio: 5.7 ratio — ABNORMAL HIGH (ref 0.0–5.0)
Cholesterol, Total: 154 mg/dL (ref 100–199)
HDL: 27 mg/dL — ABNORMAL LOW (ref >39)
LDL Chol Calc (NIH): 36 mg/dL (ref 0–99)
Triglycerides: 658 mg/dL (ref 0–149)
VLDL Cholesterol Cal: 91 mg/dL — ABNORMAL HIGH (ref 5–40)

## 2024-09-02 LAB — URIC ACID: Uric Acid: 3.8 mg/dL (ref 3.8–8.4)

## 2024-09-02 LAB — HEMOGLOBIN A1C
Est. average glucose Bld gHb Est-mCnc: 189 mg/dL
Hgb A1c MFr Bld: 8.2 % — ABNORMAL HIGH (ref 4.8–5.6)

## 2024-09-02 LAB — TSH: TSH: 2.65 u[IU]/mL (ref 0.450–4.500)

## 2024-09-03 ENCOUNTER — Encounter: Payer: Self-pay | Admitting: Physician Assistant

## 2024-09-03 ENCOUNTER — Ambulatory Visit: Admitting: Physician Assistant

## 2024-09-03 VITALS — BP 132/76 | HR 82 | Temp 98.2°F | Resp 18 | Ht 71.0 in | Wt 271.4 lb

## 2024-09-03 DIAGNOSIS — Z23 Encounter for immunization: Secondary | ICD-10-CM | POA: Diagnosis not present

## 2024-09-03 DIAGNOSIS — E785 Hyperlipidemia, unspecified: Secondary | ICD-10-CM

## 2024-09-03 DIAGNOSIS — N400 Enlarged prostate without lower urinary tract symptoms: Secondary | ICD-10-CM | POA: Diagnosis not present

## 2024-09-03 DIAGNOSIS — E1122 Type 2 diabetes mellitus with diabetic chronic kidney disease: Secondary | ICD-10-CM

## 2024-09-03 DIAGNOSIS — E1169 Type 2 diabetes mellitus with other specified complication: Secondary | ICD-10-CM | POA: Diagnosis not present

## 2024-09-03 DIAGNOSIS — Z8739 Personal history of other diseases of the musculoskeletal system and connective tissue: Secondary | ICD-10-CM

## 2024-09-03 DIAGNOSIS — M4300 Spondylolysis, site unspecified: Secondary | ICD-10-CM

## 2024-09-03 DIAGNOSIS — L400 Psoriasis vulgaris: Secondary | ICD-10-CM

## 2024-09-03 DIAGNOSIS — N1831 Chronic kidney disease, stage 3a: Secondary | ICD-10-CM

## 2024-09-03 MED ORDER — TRESIBA FLEXTOUCH 200 UNIT/ML ~~LOC~~ SOPN: 65.0000 [IU] | PEN_INJECTOR | SUBCUTANEOUS | Status: DC

## 2024-09-03 MED ORDER — ATORVASTATIN CALCIUM 80 MG PO TABS: 80.0000 mg | ORAL_TABLET | Freq: Every day | ORAL | 1 refills | Status: AC

## 2024-09-03 NOTE — Progress Notes (Signed)
 Established Patient Office Visit  Subjective:  Patient ID: Brent Duarte, male    DOB: Aug 10, 1956  Age: 68 y.o. MRN: [id number]  CC:  Chief Complaint  Patient presents with   Medical Management of Chronic Issues    HPI Brent Duarte presents for follow up of diabetes Pt here for follow up of diabetes - states he is still getting high readings and has not been watching his diet - recent hgb A1c at 8.2-  He is currently taking jardiance  25mg  qd, tresiba  56 units daily, and metformin  500mg  bid He is on a statin  - lipitor 40mg  qd  Pt is up to date on eye exam Recommend we adjust his insulin  and he work on watching diet better  Pt has history of BPH and has seen urology -  He states that he has been advised to have another biopsy of his prostate because his levels had been high but most recent PSA normal 2.4 3/25 He is on proscar and has also  flomax -  He has not had urology follow up appt recently  Pt follows with pain management for chronic low back pain with Dr Bonner - he has received injections in the past which had been helpful but now having a bit of breakthrough back pain  Currently on oxycodone  prn and gabapentin 300mg  tid  Pt follows with dermatology for psoriasis - currently on dupixent and doing well  Pt with chronic kidney disease stage 3b - sees Dr Jerrye every 6 months  Mixed hyperlipidemia  Pt presents with hyperlipidemia. . Compliance with treatment has been good -The patient is compliant with medications, maintains a low cholesterol diet , follows up as directed ,. The patient denies experiencing any hypercholesterolemia related symptoms. Currently taking lipitor 40mg  qd, welchol  and lovaza  His trigs were elevated with last labwork and he would like to stop welchol  because of trouble swallowing  Pt with history of gout - takes allopurinol  300mg   Pt would like flu and covid vaccines today Past Medical History:  Diagnosis Date   Arthritis    BPH without  obstruction/lower urinary tract symptoms 02/01/2020   History of chicken pox    History of kidney stones    History of measles    History of mumps    Hyperlipidemia    Idiopathic gout of right knee 02/01/2020   Imbalance    secondary to back issues   Multiple gastric ulcers    Numbness and tingling of both legs    Pneumonia    Psoriasis vulgaris 02/01/2020   Recurrent falls     Past Surgical History:  Procedure Laterality Date   bicep tear     right lower arm related to overgrowth of bone as stated per pt    KNEE ARTHROSCOPY     bilat   lithrotripsy     for kidney stones   LUMBAR LAMINECTOMY/DECOMPRESSION MICRODISCECTOMY N/A 09/07/2015   Procedure: MICRO LUMBAR DECOMPRESSION L4 - L5, REMOVAL OF SYNOVIAL CYST L4-L5;  Surgeon: Reyes Billing, MD;  Location: [facility];  Service: Orthopedics;  Laterality: N/A;   URETERAL STENT PLACEMENT     secondary to kidney stones/pt states had laser surgery prior     Family History  Problem Relation Age of Onset   Lung cancer Mother    Lung cancer Father    Diabetes Neg Hx     Social History   Socioeconomic History   Marital status: Married    Spouse name: Not on file  Number of children: Not on file   Years of education: Not on file   Highest education level: Not on file  Occupational History    Employer: [employer]   Tobacco Use   Smoking status: Never   Smokeless tobacco: Never  Substance and Sexual Activity   Alcohol use: No   Drug use: No   Sexual activity: Yes    Partners: Female  Other Topics Concern   Not on file  Social History Narrative   Not on file   Social Drivers of Health   Financial Resource Strain: Low Risk  (08/31/2024)   Overall Financial Resource Strain (CARDIA)    Difficulty of Paying Living Expenses: Not very hard  Food Insecurity: No Food Insecurity (08/31/2024)   Hunger Vital Sign    Worried About Running Out of Food in the Last Year: Never true    Ran Out of Food in the Last Year: Never true   Transportation Needs: No Transportation Needs (08/31/2024)   PRAPARE - Administrator, Civil Service (Medical): No    Lack of Transportation (Non-Medical): No  Physical Activity: Insufficiently Active (08/31/2024)   Exercise Vital Sign    Days of Exercise per Week: 2 days    Minutes of Exercise per Session: 30 min  Stress: No Stress Concern Present (08/31/2024)   [employer] of Occupational Health - Occupational Stress Questionnaire    Feeling of Stress: Not at all  Social Connections: Socially Isolated (08/31/2024)   Social Connection and Isolation Panel    Frequency of Communication with Friends and Family: More than three times a week    Frequency of Social Gatherings with Friends and Family: More than three times a week    Attends Religious Services: Patient declined    Database Administrator or Organizations: No    Attends Banker Meetings: Not on file    Marital Status: Widowed  Intimate Partner Violence: Not At Risk (04/02/2024)   Humiliation, Afraid, Rape, and Kick questionnaire    Fear of Current or Ex-Partner: No    Emotionally Abused: No    Physically Abused: No    Sexually Abused: No     Current Outpatient Medications:    acetaminophen  (TYLENOL ) 500 MG tablet, Tylenol  Extra Strength, Disp: , Rfl:    allopurinol  (ZYLOPRIM ) 300 MG tablet, TAKE 1 TABLET BY MOUTH EVERY DAY, Disp: 90 tablet, Rfl: 1   aspirin EC 81 MG tablet, Take 81 mg by mouth daily. Swallow whole., Disp: , Rfl:    atorvastatin  (LIPITOR) 80 MG tablet, Take 1 tablet (80 mg total) by mouth daily., Disp: 90 tablet, Rfl: 1   B-D 3CC LUER-LOK SYR 25GX1 25G X 1 3 ML MISC, USE 1 ML BY DOES NOT APPLY ROUTE EVERY 14 (FOURTEEN) DAYS. ( 1 AND 1/2 INCH ON BACKORDER), Disp: , Rfl:    BD PEN NEEDLE MICRO U/F 32G X 6 MM MISC, TO USE WITH INSULIN  EVERY DAY, Disp: 100 each, Rfl: 3   dupilumab (DUPIXENT) 300 MG/2ML prefilled syringe, Inject 300 mg into the skin every 14 (fourteen) days.,  Disp: , Rfl:    finasteride (PROSCAR) 5 MG tablet, Take 5 mg by mouth daily., Disp: , Rfl:    glucose blood (ONETOUCH VERIO) test strip, USE TO TEST SUGAR DAILY, Disp: 100 strip, Rfl: 3   Ibuprofen (ADVIL) 200 MG CAPS, , Disp: , Rfl:    insulin  degludec (TRESIBA  FLEXTOUCH) 200 UNIT/ML FlexTouch Pen, INJECT 60 UNITS INTO THE SKIN AT  BEDTIME., Disp: 9 mL, Rfl: 5   JARDIANCE  25 MG TABS tablet, TAKE 1 TABLET BY MOUTH EVERY DAY BEFORE BREAKFAST, Disp: 90 tablet, Rfl: 1   metFORMIN  (GLUCOPHAGE -XR) 500 MG 24 hr tablet, TAKE 2 TABLETS BY MOUTH EVERY DAY WITH BREAKFAST, Disp: 180 tablet, Rfl: 3   omega-3 acid ethyl esters (LOVAZA ) 1 g capsule, TAKE 2 CAPSULES BY MOUTH TWICE A DAY, Disp: 360 capsule, Rfl: 1   oxyCODONE -acetaminophen  (PERCOCET) 10-325 MG tablet, Take 1 tablet by mouth every 4 (four) hours as needed for pain., Disp: 60 tablet, Rfl: 0   tamsulosin (FLOMAX) 0.4 MG CAPS capsule, Take 0.4 mg by mouth daily., Disp: , Rfl:    gabapentin (NEURONTIN) 300 MG capsule, Take 300 mg by mouth 3 (three) times daily., Disp: , Rfl:    Allergies  Allergen Reactions   Bromocriptine  Other (See Comments)    GI issues   Ciprofloxacin Other (See Comments)    Tendon issues Other reaction(s): other   Dulaglutide Other (See Comments) and Nausea Only    GI issues  CONSTITUTIONAL: Negative for chills, fatigue, fever, unintentional weight gain and unintentional weight loss.  E/N/T: Negative for ear pain, nasal congestion and sore throat.  CARDIOVASCULAR: Negative for chest pain, dizziness, palpitations and pedal edema.  RESPIRATORY: Negative for recent cough and dyspnea.  GASTROINTESTINAL: Negative for abdominal pain, acid reflux symptoms, constipation, diarrhea, nausea and vomiting.  MSK: see HPI INTEGUMENTARY: Negative for rash.  NEUROLOGICAL: Negative for dizziness and headaches.  PSYCHIATRIC: Negative for sleep disturbance and to question depression screen.  Negative for depression, negative for  anhedonia.        Objective:  PHYSICAL EXAM:   VS: BP 132/76   Pulse 82   Temp 98.2 F (36.8 C) (Temporal)   Resp 18   Ht 5' 11 (1.803 m)   Wt 271 lb 6.4 oz (123.1 kg)   SpO2 97%   BMI 37.85 kg/m   GEN: Well nourished, well developed, in no acute distress  Cardiac: RRR; no murmurs, rubs, or gallops,no edema -  Respiratory:  normal respiratory rate and pattern with no distress - normal breath sounds with no rales, rhonchi, wheezes or rubs MS: no deformity or atrophy  Skin: psoriasis of hands noted Neuro:  Alert and Oriented x 3, - CN II-Xii grossly intact Psych: euthymic mood, appropriate affect and demeanor   Diabetic Foot Exam - Simple   Simple Foot Form Diabetic Foot exam was performed with the following findings: Yes 09/03/2024  8:53 AM  Visual Inspection No deformities, no ulcerations, no other skin breakdown bilaterally: Yes Sensation Testing Intact to touch and monofilament testing bilaterally: Yes Pulse Check Posterior Tibialis and Dorsalis pulse intact bilaterally: Yes Comments      There are no preventive care reminders to display for this patient.    There are no preventive care reminders to display for this patient.  Lab Results  Component Value Date   TSH 2.650 09/01/2024   Lab Results  Component Value Date   WBC 4.6 09/01/2024   HGB 16.2 09/01/2024   HCT 47.7 09/01/2024   MCV 93 09/01/2024   PLT 120 (L) 09/01/2024   Lab Results  Component Value Date   NA 139 09/01/2024   K 4.3 09/01/2024   CO2 22 09/01/2024   GLUCOSE 184 (H) 09/01/2024   BUN 27 09/01/2024   CREATININE 1.12 09/01/2024   BILITOT 0.9 09/01/2024   ALKPHOS 122 09/01/2024   AST 15 09/01/2024   ALT 22 09/01/2024  PROT 6.5 09/01/2024   ALBUMIN 4.4 09/01/2024   CALCIUM  9.5 09/01/2024   ANIONGAP 7 08/31/2015   EGFR 72 09/01/2024   GFR 47.85 (L) 03/09/2022   Lab Results  Component Value Date   CHOL 154 09/01/2024   Lab Results  Component Value Date   HDL 27 (L)  09/01/2024   Lab Results  Component Value Date   LDLCALC 36 09/01/2024   Lab Results  Component Value Date   TRIG 658 (HH) 09/01/2024   Lab Results  Component Value Date   CHOLHDL 5.7 (H) 09/01/2024   Lab Results  Component Value Date   HGBA1C 8.2 (H) 09/01/2024      Assessment & Plan:   Problem List Items Addressed This Visit       Endocrine   Type 2 diabetes mellitus with stage 3a chronic kidney disease, without long-term current use of insulin  - Primary   Relevant Medications   insulin  degludec (TRESIBA  FLEXTOUCH) 200 UNIT/ML FlexTouch Pen Increase tresiba  to 65 units daily for 2 weeks -- if fasting glucose still above 180 recommend to increase to 70 units Continue other meds as directed Watch diet better Return in 4 months for fasting labs and follow up  Hyperlipidemia associated with diabetes (HCC) Stop welchol  Increase lipitor to 80mg  qd Watch diet  BPH Continue meds    Psoriasis Continue med as directed Follow up with dermatology as directed  Chronic low back pain Continue with pain management    Gouty arthritis Continue allopurinol   Due for immunizations Fluzone Hi dose and COVID vaccines given        Labwork pending   Follow-up: Return in about 4 months (around 01/01/2025) for chronic fasting follow-up.    SARA R Dariel Pellecchia, PA-C

## 2024-09-07 ENCOUNTER — Ambulatory Visit: Payer: Self-pay

## 2024-09-07 NOTE — Telephone Encounter (Signed)
 Please call patient Can take tylenol  as needed/cool compresses

## 2024-09-07 NOTE — Telephone Encounter (Signed)
 FYI Only or Action Required?: Action required by provider: clinical question for provider.  Patient was last seen in primary care on 09/03/2024 by Nicholaus Credit, PA-C.  Called Nurse Triage reporting Immunization Reaction.  Symptoms began 4 days ago.  Interventions attempted: Rest, hydration, or home remedies.  Symptoms are: gradually improving.  Triage Disposition: Call PCP Within 24 Hours  Patient/caregiver understands and will follow disposition?: Yes  Copied from CRM #[id number]. Topic: Clinical - Red Word Triage >> Sep 07, 2024 12:26 PM Antony RAMAN wrote: Red Word that prompted transfer to Nurse Triage: red and swollen at covid vaccine site, got it 11/20 Reason for Disposition  [1] Redness around the injection site AND [2] started > 48 hours after getting vaccine AND [3] no fever   (Exception: Red area < 1 inch or 2.5 cm wide.)  Answer Assessment - Initial Assessment Questions Received COVID vaccine on 11/20-reports redness, swelling and a knot to the site of the injection. Patient reports redness coming down the arm but reports redness is better than yesterday. Encouraged use of an ice pack. Patient is asking for a call back with any other recommendations.   1. MAIN CONCERN OR SYMPTOM: What is your main concern? What is the main symptom? (e.g., fever, pain, redness, swelling)     Redness, swelling and knot to arm 2. ONSET: When was the vaccine (shot) given? How much later did the red, swelling and knot to arm begin? (e.g., hours, days ago)      Given on 11/20 in right arm-symptoms started next day.  3. SEVERITY: How bad is it?      mild 4. VACCINE: What vaccination did you receive? (e.g., none; Moderna, [employer])       [employer] 5. FEVER: Do you have a fever? If Yes, ask: What is your temperature, how was it measured, and when did it start?      Night he received the vaccine-states he did think he ran a fever-unable to measure due to not having a thermometer. Did have  chills. 6. PAST REACTIONS: Have you reacted to immunizations before? If Yes, ask: What happened?     no 7. OTHER SYMPTOMS: Do you have any other symptoms? (e.g., fatigue, headache, joint or muscle pain)     Joint pain  Protocols used: COVID-19 - Vaccine Questions and Reactions-A-AH

## 2024-09-08 NOTE — Telephone Encounter (Signed)
 Patient Made Aware, Verbalized Understanding. Patient stated area has got better

## 2024-10-09 ENCOUNTER — Other Ambulatory Visit: Payer: Self-pay

## 2024-10-09 DIAGNOSIS — E1122 Type 2 diabetes mellitus with diabetic chronic kidney disease: Secondary | ICD-10-CM

## 2024-10-09 MED ORDER — TRESIBA FLEXTOUCH 200 UNIT/ML ~~LOC~~ SOPN: 70.0000 [IU] | PEN_INJECTOR | SUBCUTANEOUS | 0 refills | Status: AC

## 2024-11-18 ENCOUNTER — Other Ambulatory Visit: Payer: Self-pay | Admitting: Physician Assistant

## 2025-01-01 ENCOUNTER — Other Ambulatory Visit

## 2025-01-04 ENCOUNTER — Ambulatory Visit: Admitting: Physician Assistant

## 2025-04-08 ENCOUNTER — Ambulatory Visit
